# Patient Record
Sex: Female | Born: 1940
Health system: Southern US, Community
[De-identification: ages and names within clinical notes are randomized; demographics above are authoritative.]

## PROBLEM LIST (undated history)

## (undated) DIAGNOSIS — F329 Major depressive disorder, single episode, unspecified: Secondary | ICD-10-CM

## (undated) DIAGNOSIS — E7800 Pure hypercholesterolemia, unspecified: Secondary | ICD-10-CM

## (undated) DIAGNOSIS — I8390 Asymptomatic varicose veins of unspecified lower extremity: Secondary | ICD-10-CM

## (undated) DIAGNOSIS — F32A Depression, unspecified: Secondary | ICD-10-CM

## (undated) DIAGNOSIS — R739 Hyperglycemia, unspecified: Secondary | ICD-10-CM

## (undated) DIAGNOSIS — F419 Anxiety disorder, unspecified: Secondary | ICD-10-CM

## (undated) DIAGNOSIS — B019 Varicella without complication: Secondary | ICD-10-CM

## (undated) DIAGNOSIS — K219 Gastro-esophageal reflux disease without esophagitis: Secondary | ICD-10-CM

## (undated) DIAGNOSIS — R011 Cardiac murmur, unspecified: Secondary | ICD-10-CM

## (undated) DIAGNOSIS — M1990 Unspecified osteoarthritis, unspecified site: Secondary | ICD-10-CM

## (undated) DIAGNOSIS — J342 Deviated nasal septum: Secondary | ICD-10-CM

## (undated) DIAGNOSIS — M5136 Other intervertebral disc degeneration, lumbar region: Secondary | ICD-10-CM

## (undated) DIAGNOSIS — K649 Unspecified hemorrhoids: Secondary | ICD-10-CM

## (undated) DIAGNOSIS — R7303 Prediabetes: Secondary | ICD-10-CM

## (undated) DIAGNOSIS — I10 Essential (primary) hypertension: Secondary | ICD-10-CM

## (undated) DIAGNOSIS — M51369 Other intervertebral disc degeneration, lumbar region without mention of lumbar back pain or lower extremity pain: Secondary | ICD-10-CM

## (undated) DIAGNOSIS — M419 Scoliosis, unspecified: Secondary | ICD-10-CM

## (undated) DIAGNOSIS — R0781 Pleurodynia: Secondary | ICD-10-CM

## (undated) DIAGNOSIS — S20219A Contusion of unspecified front wall of thorax, initial encounter: Secondary | ICD-10-CM

## (undated) HISTORY — DX: Gastro-esophageal reflux disease without esophagitis: K21.9

## (undated) HISTORY — DX: Anxiety disorder, unspecified: F41.9

## (undated) HISTORY — DX: Depression, unspecified: F32.A

## (undated) HISTORY — DX: Essential (primary) hypertension: I10

## (undated) HISTORY — DX: Unspecified osteoarthritis, unspecified site: M19.90

## (undated) HISTORY — PX: INCISION AND DRAINAGE BREAST ABSCESS: SUR672

## (undated) HISTORY — DX: Cardiac murmur, unspecified: R01.1

## (undated) HISTORY — DX: Asymptomatic varicose veins of unspecified lower extremity: I83.90

## (undated) HISTORY — PX: OVARIAN CYST SURGERY: SHX726

## (undated) HISTORY — DX: Pure hypercholesterolemia, unspecified: E78.00

## (undated) HISTORY — DX: Varicella without complication: B01.9

## (undated) HISTORY — DX: Major depressive disorder, single episode, unspecified: F32.9

---

## 1968-08-25 HISTORY — PX: TUBAL LIGATION: SHX77

## 1970-08-25 HISTORY — PX: ABDOMINAL HYSTERECTOMY: SHX81

## 1971-08-26 HISTORY — PX: TONSILLECTOMY AND ADENOIDECTOMY: SUR1326

## 1973-08-25 HISTORY — PX: HEMORROIDECTOMY: SUR656

## 1989-08-25 HISTORY — PX: OVARIAN CYST SURGERY: SHX726

## 2005-08-25 HISTORY — PX: DENTAL SURGERY: SHX609

## 2013-02-21 ENCOUNTER — Ambulatory Visit (INDEPENDENT_AMBULATORY_CARE_PROVIDER_SITE_OTHER): Payer: Medicare Other | Admitting: Family Medicine

## 2013-02-21 ENCOUNTER — Encounter: Payer: Self-pay | Admitting: Family Medicine

## 2013-02-21 VITALS — BP 165/98 | Temp 97.9°F | Ht 60.25 in | Wt 180.0 lb

## 2013-02-21 DIAGNOSIS — R7309 Other abnormal glucose: Secondary | ICD-10-CM

## 2013-02-21 DIAGNOSIS — M549 Dorsalgia, unspecified: Secondary | ICD-10-CM

## 2013-02-21 DIAGNOSIS — K219 Gastro-esophageal reflux disease without esophagitis: Secondary | ICD-10-CM

## 2013-02-21 DIAGNOSIS — R7989 Other specified abnormal findings of blood chemistry: Secondary | ICD-10-CM

## 2013-02-21 DIAGNOSIS — Z23 Encounter for immunization: Secondary | ICD-10-CM

## 2013-02-21 DIAGNOSIS — R739 Hyperglycemia, unspecified: Secondary | ICD-10-CM

## 2013-02-21 DIAGNOSIS — Z1211 Encounter for screening for malignant neoplasm of colon: Secondary | ICD-10-CM

## 2013-02-21 DIAGNOSIS — M1990 Unspecified osteoarthritis, unspecified site: Secondary | ICD-10-CM

## 2013-02-21 DIAGNOSIS — E785 Hyperlipidemia, unspecified: Secondary | ICD-10-CM

## 2013-02-21 DIAGNOSIS — I10 Essential (primary) hypertension: Secondary | ICD-10-CM

## 2013-02-21 LAB — CBC WITH DIFFERENTIAL/PLATELET
Basophils Relative: 0.4 % (ref 0.0–3.0)
Eosinophils Relative: 1.2 % (ref 0.0–5.0)
Lymphocytes Relative: 28.2 % (ref 12.0–46.0)
Monocytes Absolute: 0.3 10*3/uL (ref 0.1–1.0)
Neutrophils Relative %: 65 % (ref 43.0–77.0)
Platelets: 198 10*3/uL (ref 150.0–400.0)
RBC: 4.48 Mil/uL (ref 3.87–5.11)
WBC: 5.7 10*3/uL (ref 4.5–10.5)

## 2013-02-21 LAB — HEMOGLOBIN A1C: Hgb A1c MFr Bld: 5.9 % (ref 4.6–6.5)

## 2013-02-21 LAB — LIPID PANEL
Cholesterol: 227 mg/dL — ABNORMAL HIGH (ref 0–200)
HDL: 49.1 mg/dL (ref >39.00)
Total CHOL/HDL Ratio: 5
Triglycerides: 207 mg/dL — ABNORMAL HIGH (ref 0.0–149.0)
VLDL: 41.4 mg/dL — ABNORMAL HIGH (ref 0.0–40.0)

## 2013-02-21 LAB — BASIC METABOLIC PANEL
BUN: 19 mg/dL (ref 6–23)
Calcium: 9.9 mg/dL (ref 8.4–10.5)
Creatinine, Ser: 0.8 mg/dL (ref 0.4–1.2)
GFR: 75.84 mL/min (ref >60.00)

## 2013-02-21 MED ORDER — OMEPRAZOLE 40 MG PO CPDR: 40.0000 mg | DELAYED_RELEASE_CAPSULE | Freq: Every day | ORAL | Status: DC

## 2013-02-21 NOTE — Patient Instructions (Signed)
-We have ordered labs or studies at this visit. It can take up to 1-2 weeks for results and processing. We will contact you with instructions IF your results are abnormal. Normal results will be released to your [facility] If you have not heard from [place] or can not find your results in [facility] in 2 weeks please contact our office.  -PLEASE SIGN UP FOR MYCHART TODAY   We recommend the following healthy lifestyle measures: - eat a healthy diet consisting of lots of vegetables, fruits, beans, nuts, seeds, healthy meats such as white chicken and fish and whole grains.  - avoid fried foods, fast food, processed foods, sodas, red meet and other fattening foods.  - get a least 150 minutes of aerobic exercise per week.   For your acid reflux: -start the Prilosec daily and follow diet recommendations below  For Your Back: -home exercise program provided -heat for 15 minutes twice daily -topical sports creams with capsacin or menthol if you find these help -We placed a referral for you as discussed to the back doctor. It usually takes about 1-2 weeks to process and schedule this referral. If you have not heard from [place] regarding this appointment in 2 weeks please contact our office.  Schedule your mammogram  -We placed a referral for you as discussed to the gastroenterologist for your colonoscopy. It usually takes about 1-2 weeks to process and schedule this referral. If you have not heard from [place] regarding this appointment in 2 weeks please contact our office.   Follow up in: 4 weeks to recheck BP and for your physical exam    Gastroesophageal Reflux Disease, Adult Gastroesophageal reflux disease (GERD) happens when acid from your stomach flows up into the esophagus. When acid comes in contact with the esophagus, the acid causes soreness (inflammation) in the esophagus. Over time, GERD may create small holes (ulcers) in the lining of the esophagus. CAUSES   Increased body weight. This puts pressure  on the stomach, making acid rise from the stomach into the esophagus.  Smoking. This increases acid production in the stomach.  Drinking alcohol. This causes decreased pressure in the lower esophageal sphincter (valve or ring of muscle between the esophagus and stomach), allowing acid from the stomach into the esophagus.  Late evening meals and a full stomach. This increases pressure and acid production in the stomach.  A malformed lower esophageal sphincter. Sometimes, no cause is found. SYMPTOMS   Burning pain in the lower part of the mid-chest behind the breastbone and in the mid-stomach area. This may occur twice a week or more often.  Trouble swallowing.  Sore throat.  Dry cough.  Asthma-like symptoms including chest tightness, shortness of breath, or wheezing. DIAGNOSIS  Your caregiver may be able to diagnose GERD based on your symptoms. In some cases, X-rays and other tests may be done to check for complications or to check the condition of your stomach and esophagus. TREATMENT  Your caregiver may recommend over-the-counter or prescription medicines to help decrease acid production. Ask your caregiver before starting or adding any new medicines.  HOME CARE INSTRUCTIONS   Change the factors that you can control. Ask your caregiver for guidance concerning weight loss, quitting smoking, and alcohol consumption.  Avoid foods and drinks that make your symptoms worse, such as:  Caffeine or alcoholic drinks.  Chocolate.  Peppermint or mint flavorings.  Garlic and onions.  Spicy foods.  Citrus fruits, such as oranges, lemons, or limes.  Tomato-based foods such as sauce,  chili, salsa, and pizza.  Fried and fatty foods.  Avoid lying down for the 3 hours prior to your bedtime or prior to taking a nap.  Eat small, frequent meals instead of large meals.  Wear loose-fitting clothing. Do not wear anything tight around your waist that causes pressure on your  stomach.  Raise the head of your bed 6 to 8 inches with wood blocks to help you sleep. Extra pillows will not help.  Only take over-the-counter or prescription medicines for pain, discomfort, or fever as directed by your caregiver.  Do not take aspirin, ibuprofen, or other nonsteroidal anti-inflammatory drugs (NSAIDs). SEEK IMMEDIATE MEDICAL CARE IF:   You have pain in your arms, neck, jaw, teeth, or back.  Your pain increases or changes in intensity or duration.  You develop nausea, vomiting, or sweating (diaphoresis).  You develop shortness of breath, or you faint.  Your vomit is green, yellow, black, or looks like coffee grounds or blood.  Your stool is red, bloody, or black. These symptoms could be signs of other problems, such as heart disease, gastric bleeding, or esophageal bleeding. MAKE SURE YOU:   Understand these instructions.  Will watch your condition.  Will get help right away if you are not doing well or get worse. Document Released: 05/21/2005 Document Revised: 11/03/2011 Document Reviewed: 02/28/2011 [facility] Patient Information 2014 [place], [place].

## 2013-02-21 NOTE — Addendum Note (Signed)
Addended by: Earle Gell C on: 02/21/2013 01:00 PM   Modules accepted: Orders

## 2013-02-21 NOTE — Progress Notes (Signed)
Chief Complaint  Patient presents with  . Establish Care    HPI:  Tammy [name] is here to establish care. Moved to [place] recently. Reports does not go to a doctor often.  Last PCP and physical:  Has the following chronic problems and concerns today:  Chronic Back Pain: -started many years ago - dx of DDD in 1971, has had worsening of back issues over last 3 years -pain is mid-lowback R>L, constant, pain is 0-10/10 -better with laying flat, standing or sitting for long periods makes it worse -occ intermittent numbness on R lateral upper leg -denies: fevers, malaise, weakness in legs, bowel of bladder dysfunction, weight loss -she wants to see a specialist but prefers non-surgical options  GERD: -chronically - but worse last 6 months -takes tums a few times per day -symptoms worse after meals, feels acid coming up in throat -denies: weight loss, CP, trouble swallowing  -did try an OTC medicine but doesn't remember what or if it helped  HTN/HLD: -reports never went to doctor regularly but has had elevated lipids and HTN in the past -reports up in the past, BP runs high sometimes, but reports never on medication -denies: CP, SOB, swelling, palpitation  There are no active problems to display for this patient.  Health Maintenance: -refuses pneumonia vaccine -wants shingles vaccine -last mammo in the 70s -never had colon cancer screening and wants to do colonoscopy -hx of hysterectomy  ROS: See pertinent positives and negatives per HPI.  Past Medical History  Diagnosis Date  . Arthritis   . Chicken pox   . Hypertension     high blood pressure readings  . Varicose vein   . Depression     mild, never on medication  . GERD (gastroesophageal reflux disease)   . Heart murmur     told in 2012 benign  . High cholesterol     Family History  Problem Relation Age of Onset  . Alcohol abuse Father   . Heart disease Father   . Arthritis Mother   . Stroke Mother    . Hypertension Mother   . Diabetes Mother   . Colon cancer Other   . Breast cancer Maternal Grandmother   . Breast cancer Maternal Aunt     History   Social History  . Marital Status: Single    Spouse Name: N/A    Number of Children: N/A  . Years of Education: N/A   Social History Main Topics  . Smoking status: Never Smoker   . Smokeless tobacco: None  . Alcohol Use: Yes     Comment: daily; 2 beers and sometimes a mixed drink   . Drug Use: No  . Sexually Active: None   Other Topics Concern  . 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      Spiritual Beliefs: spiritual - but not religious      Lifestyle: rolls side to side, true back, mule kicks, walks 3-4 days per week for 1 mile; diet is ok - not great             Current outpatient prescriptions:aspirin 325 MG tablet, Take 325 mg by mouth daily., Disp: , Rfl: ;  Cholecalciferol (VITAMIN D) 1000 UNITS capsule, Take 1,000 Units by mouth daily., Disp: , Rfl: ;  fish oil-omega-3 fatty acids 1000 MG capsule, Take 2 g by mouth 2 (two) times daily., Disp: , Rfl: ;  ibuprofen (ADVIL,MOTRIN)  200 MG tablet, Take 200 mg by mouth every 6 (six) hours as needed for pain., Disp: , Rfl:  Multiple Vitamin (MULTIVITAMIN) tablet, Take 1 tablet by mouth. 1/2 in am and pm, Disp: , Rfl: ;  Melatonin 10 MG CAPS, Take by mouth at bedtime., Disp: , Rfl: ;  omeprazole (PRILOSEC) 40 MG capsule, Take 1 capsule (40 mg total) by mouth daily., Disp: 30 capsule, Rfl: 3  EXAM:  Filed Vitals:   02/21/13 1103  BP: 165/98  Temp: 97.9 F (36.6 C)    Body mass index is 34.88 kg/(m^2).  GENERAL: vitals reviewed and listed above, alert, oriented, appears well hydrated and in no acute distress  HEENT: atraumatic, conjunttiva clear, no obvious abnormalities on inspection of external nose and ears  NECK: no obvious masses on inspection  LUNGS: clear to auscultation bilaterally, no wheezes, rales or  rhonchi, good air movement  CV: HRRR, no peripheral edema  MS: moves all extremities without noticeable abnormality Normal Gait, scoliosis - concave L thoracic and lumbar No bony TTP Soft tissue TTP at: R paraspinal muscle lower thoracic and upper lumbar R >L -/+ tests: neg trendelenburg,+facet loading, -SLRT, -CLRT, -FABER, -FADIR Normal muscle strength, sensation to light touch and DTRs in LEs bilaterally  PSYCH: pleasant and cooperative, no obvious depression or anxiety  ASSESSMENT AND PLAN:  Discussed the following assessment and plan:  Hyperlipemia - Plan: Lipid Panel  Essential hypertension, benign - Plan: Basic metabolic panel -advised tx - she refused and wants to recheck next visit, discussed risks of untreated HTN versus meds  Abnormal CBC - Plan: CBC with Differential -reports hx abnormal CBC at one point and wants to recheck today  Hyperglycemia - Plan: Hemoglobin A1c  Back pain - Plan: Amb referral to Pediatric Physical Medicine Rehab -per exam suspect sig DDD, facet arthropathy, radiculopathy -she wants to see specialist, discussed options and with have her see PMR -HEP, supportive care in the meantime  Osteoarthritis - Plan: Amb referral to Pediatric Physical Medicine Rehab  GERD (gastroesophageal reflux disease) - Plan: omeprazole (PRILOSEC) 40 MG capsule -trial of PPI - disucssed risks  Colon cancer screening - Plan: Ambulatory referral to Gastroenterology   -We reviewed the PMH, PSH, FH, SH, Meds and Allergies. -We provided refills for any medications we will prescribe as needed. -We addressed current concerns per orders and patient instructions. -We have asked for records for pertinent exams, studies, vaccines and notes from previous providers. -We have advised patient to follow up per instructions below. -shingles vaccine today per pt request Health Maintenance: refused pneumovax, she is to schedule mammo, referred for colonoscopy, follow up for  CPE ->45 minutes spent with this patient  -Patient advised to return or notify a doctor immediately if symptoms worsen or persist or new concerns arise.  Patient Instructions  -We have ordered labs or studies at this visit. It can take up to 1-2 weeks for results and processing. We will contact you with instructions IF your results are abnormal. Normal results will be released to your [facility]. If you have not heard from [place] or can not find your results in [facility] in 2 weeks please contact our office.  -PLEASE SIGN UP FOR MYCHART TODAY   We recommend the following healthy lifestyle measures: - eat a healthy diet consisting of lots of vegetables, fruits, beans, nuts, seeds, healthy meats such as white chicken and fish and whole grains.  - avoid fried foods, fast food, processed foods, sodas, red meet and other fattening foods.  -  get a least 150 minutes of aerobic exercise per week.   For your acid reflux: -start the Prilosec daily and follow diet recommendations below  For Your Back: -home exercise program provided -heat for 15 minutes twice daily -topical sports creams with capsacin or menthol if you find these help -We placed a referral for you as discussed to the back doctor. It usually takes about 1-2 weeks to process and schedule this referral. If you have not heard from [place] regarding this appointment in 2 weeks please contact our office.  Schedule your mammogram  -We placed a referral for you as discussed to the gastroenterologist for your colonoscopy. It usually takes about 1-2 weeks to process and schedule this referral. If you have not heard from [place] regarding this appointment in 2 weeks please contact our office.   Follow up in: 4 weeks to recheck BP and for your physical exam    Gastroesophageal Reflux Disease, Adult Gastroesophageal reflux disease (GERD) happens when acid from your stomach flows up into the esophagus. When acid comes in contact with the esophagus, the acid  causes soreness (inflammation) in the esophagus. Over time, GERD may create small holes (ulcers) in the lining of the esophagus. CAUSES   Increased body weight. This puts pressure on the stomach, making acid rise from the stomach into the esophagus.  Smoking. This increases acid production in the stomach.  Drinking alcohol. This causes decreased pressure in the lower esophageal sphincter (valve or ring of muscle between the esophagus and stomach), allowing acid from the stomach into the esophagus.  Late evening meals and a full stomach. This increases pressure and acid production in the stomach.  A malformed lower esophageal sphincter. Sometimes, no cause is found. SYMPTOMS   Burning pain in the lower part of the mid-chest behind the breastbone and in the mid-stomach area. This may occur twice a week or more often.  Trouble swallowing.  Sore throat.  Dry cough.  Asthma-like symptoms including chest tightness, shortness of breath, or wheezing. DIAGNOSIS  Your caregiver may be able to diagnose GERD based on your symptoms. In some cases, X-rays and other tests may be done to check for complications or to check the condition of your stomach and esophagus. TREATMENT  Your caregiver may recommend over-the-counter or prescription medicines to help decrease acid production. Ask your caregiver before starting or adding any new medicines.  HOME CARE INSTRUCTIONS   Change the factors that you can control. Ask your caregiver for guidance concerning weight loss, quitting smoking, and alcohol consumption.  Avoid foods and drinks that make your symptoms worse, such as:  Caffeine or alcoholic drinks.  Chocolate.  Peppermint or mint flavorings.  Garlic and onions.  Spicy foods.  Citrus fruits, such as oranges, lemons, or limes.  Tomato-based foods such as sauce, chili, salsa, and pizza.  Fried and fatty foods.  Avoid lying down for the 3 hours prior to your bedtime or prior to taking  a nap.  Eat small, frequent meals instead of large meals.  Wear loose-fitting clothing. Do not wear anything tight around your waist that causes pressure on your stomach.  Raise the head of your bed 6 to 8 inches with wood blocks to help you sleep. Extra pillows will not help.  Only take over-the-counter or prescription medicines for pain, discomfort, or fever as directed by your caregiver.  Do not take aspirin, ibuprofen, or other nonsteroidal anti-inflammatory drugs (NSAIDs). SEEK IMMEDIATE MEDICAL CARE IF:   You have pain in your arms,  neck, jaw, teeth, or back.  Your pain increases or changes in intensity or duration.  You develop nausea, vomiting, or sweating (diaphoresis).  You develop shortness of breath, or you faint.  Your vomit is green, yellow, black, or looks like coffee grounds or blood.  Your stool is red, bloody, or black. These symptoms could be signs of other problems, such as heart disease, gastric bleeding, or esophageal bleeding. MAKE SURE YOU:   Understand these instructions.  Will watch your condition.  Will get help right away if you are not doing well or get worse. Document Released: 05/21/2005 Document Revised: 11/03/2011 Document Reviewed: 02/28/2011 [facility] Patient Information 2014 [place], Lona K[place], Dahlia Client R.

## 2013-02-23 ENCOUNTER — Encounter: Payer: Self-pay | Admitting: Internal Medicine

## 2013-02-23 ENCOUNTER — Other Ambulatory Visit: Payer: Self-pay

## 2013-02-23 DIAGNOSIS — Z1231 Encounter for screening mammogram for malignant neoplasm of breast: Secondary | ICD-10-CM

## 2013-02-24 NOTE — Progress Notes (Signed)
Quick Note:  Left a message for return call. ______ 

## 2013-02-24 NOTE — Progress Notes (Signed)
Quick Note:  Labs mailed to patient. ______

## 2013-03-07 ENCOUNTER — Encounter: Payer: Self-pay | Admitting: Physical Medicine & Rehabilitation

## 2013-03-18 ENCOUNTER — Ambulatory Visit
Admission: RE | Admit: 2013-03-18 | Discharge: 2013-03-18 | Disposition: A | Discharge status: Home / Self Care | Payer: Medicare Other | Source: Ambulatory Visit

## 2013-03-18 DIAGNOSIS — Z1231 Encounter for screening mammogram for malignant neoplasm of breast: Secondary | ICD-10-CM

## 2013-03-21 ENCOUNTER — Other Ambulatory Visit: Payer: Self-pay

## 2013-03-21 DIAGNOSIS — K219 Gastro-esophageal reflux disease without esophagitis: Secondary | ICD-10-CM

## 2013-03-21 MED ORDER — OMEPRAZOLE 40 MG PO CPDR: 40.0000 mg | DELAYED_RELEASE_CAPSULE | Freq: Every day | ORAL | Status: DC

## 2013-03-22 ENCOUNTER — Encounter: Payer: Self-pay | Admitting: Family Medicine

## 2013-03-22 ENCOUNTER — Ambulatory Visit (INDEPENDENT_AMBULATORY_CARE_PROVIDER_SITE_OTHER): Payer: Medicare Other | Admitting: Family Medicine

## 2013-03-22 VITALS — BP 130/88 | Temp 98.1°F | Wt 177.0 lb

## 2013-03-22 DIAGNOSIS — Z8262 Family history of osteoporosis: Secondary | ICD-10-CM

## 2013-03-22 DIAGNOSIS — R229 Localized swelling, mass and lump, unspecified: Secondary | ICD-10-CM

## 2013-03-22 DIAGNOSIS — R2230 Localized swelling, mass and lump, unspecified upper limb: Secondary | ICD-10-CM

## 2013-03-22 DIAGNOSIS — E785 Hyperlipidemia, unspecified: Secondary | ICD-10-CM

## 2013-03-22 DIAGNOSIS — I10 Essential (primary) hypertension: Secondary | ICD-10-CM | POA: Insufficient documentation

## 2013-03-22 DIAGNOSIS — M259 Joint disorder, unspecified: Secondary | ICD-10-CM

## 2013-03-22 DIAGNOSIS — R7303 Prediabetes: Secondary | ICD-10-CM

## 2013-03-22 DIAGNOSIS — R7309 Other abnormal glucose: Secondary | ICD-10-CM

## 2013-03-22 DIAGNOSIS — Z13820 Encounter for screening for osteoporosis: Secondary | ICD-10-CM

## 2013-03-22 DIAGNOSIS — M549 Dorsalgia, unspecified: Secondary | ICD-10-CM

## 2013-03-22 DIAGNOSIS — Z23 Encounter for immunization: Secondary | ICD-10-CM

## 2013-03-22 DIAGNOSIS — Z8269 Family history of other diseases of the musculoskeletal system and connective tissue: Secondary | ICD-10-CM

## 2013-03-22 DIAGNOSIS — E669 Obesity, unspecified: Secondary | ICD-10-CM | POA: Insufficient documentation

## 2013-03-22 DIAGNOSIS — R2231 Localized swelling, mass and lump, right upper limb: Secondary | ICD-10-CM

## 2013-03-22 DIAGNOSIS — G8929 Other chronic pain: Secondary | ICD-10-CM | POA: Insufficient documentation

## 2013-03-22 DIAGNOSIS — Z0000 Encounter for general adult medical examination without abnormal findings: Secondary | ICD-10-CM

## 2013-03-22 DIAGNOSIS — K219 Gastro-esophageal reflux disease without esophagitis: Secondary | ICD-10-CM | POA: Insufficient documentation

## 2013-03-22 NOTE — Progress Notes (Signed)
Medicare Annual Preventive Care Visit  (annual wellness exam)  Concerns/Follow up issues today:  A) HTN: -refused tx last visit -takes ASA and fish oil; she has been working on diet somewhat, does exercise daily -denies:CP, SOB, swelling  B)chronic back pain -referred to PMR per her request last visit -seeing Dr. 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  NO  History of falling?  NO  Any trouble with IADLs - using a phone, using transportation, grocery shopping, preparing meals, doing housework, doing laundry, taking medications and managing money?  NO  Advance Directives? NO   See summary of recommendations in Patient Instructions below.  4.) Physical Exam Filed Vitals:   03/22/13 1134  BP: 130/88  Temp: 98.1 F (36.7 C)   Estimated body mass index is 34.3 kg/(m^2) as calculated from the following:   Height as of 02/21/13: 5' 0.25" (1.53 m).   Weight as of this encounter: 177 lb (80.287 kg).  NEURO/PSYCH: Mini Cog: 1. Patient instructed to listen carefully and repeat the following: Recalled 2 words  2. Clock drawing test was administered: NORMAL       3. Recall of three words 2/3, normal clock draw  Patient Score: NEG   CV:  HRRR  LUNG: CTA  SKIN/MS: Has large soft mobile subcutaneous mass superior to L medial clavicle and aprox 5 cm sub cutaneous mobile soft mass on R upper arm  See patient instructions for recommendations.  4)The following written screening schedule of preventive measures were reviewed with assessment and plan made per below, orders and patient instructions:       Alcohol screening: negative - discussed safe drinking leves      Obesity Screening and counseling: lifestyle recs for diet and exercise discussed      STI screening: neg/deferred      Tobacco Screening: neg       Pneumococcal (PPSV23 -one dose after 64, one before if risk factors), influenza yearly and hepatitis B vaccines (if high risk - end stage renal disease, IV drugs, homosexual men, live in home for mentally retarded, hemophilia receiving factors) ASSESSMENT/PLAN: will have today at this visit      Screening mammograph (yearly if >40) ASSESSMENT/PLAN: done/03/18/13 and normal      Screening Pap smear/pelvic exam (q2 years) ASSESSMENT/PLAN: N/A, s/p hysterectomy      Colorectal cancer screening (FOBT yearly or flex sig q4y or colonoscopy q10y or barium enema q4y) ASSESSMENT/PLAN: referred for colonoscopy; scheduled in October 2014      Bone mass measurements(covered q2y if indicated - estrogen def, osteoporosis, hyperparathyroid, vertebral abnormalities, osteoporosis or steroids) ASSESSMENT/PLAN: referral sent for bone density      Cardiovascular screening blood tests (lipids q5y) ASSESSMENT/PLAN: done last visit      Diabetes screening  tests ASSESSMENT/PLAN: Done last visit  Encounter for Medicare annual wellness exam - Plan: Pneumococcal polysaccharide vaccine 23-valent greater than or equal to 2yo subcutaneous/IM  Essential hypertension, benign  Chronic back pain  GERD (gastroesophageal reflux disease)  Prediabetes  Hyperlipidemia, mild  Screening for osteoporosis - Plan: DG Bone Density  Shoulder  mass - Plan: Ambulatory referral to General Surgery  Arm mass, right - Plan: Ambulatory referral to General Surgery    7.) Summary: -risk factors and conditions per above assessment were discussed and treatment, recommendations and referrals were offered per documentation above and orders and patient instructions.

## 2013-03-22 NOTE — Patient Instructions (Addendum)
Please see a lawyer and/or go to this website to help you with advanced directives and designating a health care power of attorney so that your wishes will be followed should you become too ill to make your own medical decisions.  [email or web address]  We recommend the following healthy lifestyle measures: - eat a healthy diet consisting of lots of vegetables, fruits, beans, nuts, seeds, healthy meats such as white chicken and fish and whole grains.  - avoid fried foods, fast food, processed foods, sodas, red meet and other fattening foods.  - get a least 150 minutes of aerobic exercise per week.       Alcohol screening: negative - discussed safe drinking leves      Obesity Screening and counseling: lifestyle recs      STI screening: neg/deferred      Tobacco Screening: neg       Pneumococcal (PPSV23 -one dose after 64, one before if risk factors), influenza yearly and hepatitis B vaccines (if high risk - end stage renal disease, IV drugs, homosexual men, live in home for mentally retarded, hemophilia receiving factors) ASSESSMENT/PLAN: will have today at this visit      Screening mammograph (yearly if >40) ASSESSMENT/PLAN: done/03/18/13 and normal      Screening Pap smear/pelvic exam (q2 years) ASSESSMENT/PLAN: N/A, s/p hysterectomy      Colorectal cancer screening (FOBT yearly or flex sig q4y or colonoscopy q10y or barium enema q4y) ASSESSMENT/PLAN: referred for colonoscopy; scheduled in October 2014      Bone mass measurements(covered q2y if indicated - estrogen def, osteoporosis, hyperparathyroid, vertebral abnormalities, osteoporosis or steroids) ASSESSMENT/PLAN: referral sent for bone density      Cardiovascular screening blood tests (lipids q5y) ASSESSMENT/PLAN: done last visit      Diabetes screening tests ASSESSMENT/PLAN: Done last visit   -We placed a referral for you as discussed to the surgeon for your shoulder mass. It usually takes about 1-2  weeks to process and schedule this referral. If you have not heard from [place] regarding this appointment in 2 weeks please contact our office.

## 2013-03-29 ENCOUNTER — Telehealth: Payer: Self-pay | Admitting: Family Medicine

## 2013-03-29 NOTE — Telephone Encounter (Signed)
PT states that she would no longer like to have her bone density scan done at [facility], she would like to have it done at the breast center. Please revise orders.

## 2013-03-29 NOTE — Addendum Note (Signed)
Addended by: Earle Gell C on: 03/29/2013 11:00 AM   Modules accepted: Orders

## 2013-03-29 NOTE — Telephone Encounter (Signed)
Order changed.

## 2013-04-01 ENCOUNTER — Other Ambulatory Visit: Payer: Self-pay

## 2013-04-01 DIAGNOSIS — E2839 Other primary ovarian failure: Secondary | ICD-10-CM

## 2013-04-04 ENCOUNTER — Telehealth: Payer: Self-pay | Admitting: Family Medicine

## 2013-04-04 DIAGNOSIS — M1990 Unspecified osteoarthritis, unspecified site: Secondary | ICD-10-CM

## 2013-04-04 DIAGNOSIS — M549 Dorsalgia, unspecified: Secondary | ICD-10-CM

## 2013-04-04 NOTE — Telephone Encounter (Signed)
i spoke with [place] about the bone density and called and spoke with the breat center and changed the diagnosis code; Pls advise on ortho referral.

## 2013-04-04 NOTE — Telephone Encounter (Signed)
Referral placed.  Pt states she will wait until she sees Dr. Penni Bombard and then she will decide where she wants to have it.

## 2013-04-04 NOTE — Telephone Encounter (Signed)
Patient had referral to [facility] PM&R from Dr Selena Batten and had appt sched there. That office pushed the appt out and rescheduled her. Pt is cancelling that appt. Has made an appt at [facility] (Dr. Penni Bombard) for 8/25. She is requesting referral to Ortho for that appt instead. States all her osteo pain is getting worse.  She will cancel PM&R appt. Also, she does not want [place] to set her up for a bone density. Order is in EPIC, but no appt yet. She states she "does not yet have dx to support the test". However, she said she will have it done at [facility]. Thank you.

## 2013-04-04 NOTE — Telephone Encounter (Signed)
Ok on ortho referral.

## 2013-04-05 ENCOUNTER — Ambulatory Visit (INDEPENDENT_AMBULATORY_CARE_PROVIDER_SITE_OTHER): Payer: Medicare Other | Admitting: Surgery

## 2013-04-05 ENCOUNTER — Encounter (INDEPENDENT_AMBULATORY_CARE_PROVIDER_SITE_OTHER): Payer: Self-pay | Admitting: Surgery

## 2013-04-05 VITALS — BP 162/90 | HR 68 | Temp 97.2°F | Resp 12 | Ht 64.0 in | Wt 174.6 lb

## 2013-04-05 DIAGNOSIS — M799 Soft tissue disorder, unspecified: Secondary | ICD-10-CM

## 2013-04-05 DIAGNOSIS — M7989 Other specified soft tissue disorders: Secondary | ICD-10-CM | POA: Insufficient documentation

## 2013-04-05 NOTE — Progress Notes (Signed)
Patient ID: Tammy [name], female   DOB: [birth year], 72 y.o.   MRN: [id number]  Chief Complaint  Patient presents with  . Mass    HPI Tammy [name] is a 72 y.o. female.   HPI This is a very pleasant female referred by Dr. Verda Cumins for evaluation of multiple painful soft tissue masses. The patient reports she has had these for many years but are becoming quite larger and causing her discomfort. She has discomfort in her left shoulder as well as along her right back. She also has a 1 cm abnormal-appearing skin lesion on her left inner thigh which is of concern to her. She is otherwise without complaints. She reports the size is becoming larger over all these areas and causing increasing discomfort  Past Medical History  Diagnosis Date  . Arthritis   . Chicken pox   . Hypertension     high blood pressure readings  . Varicose vein   . Depression     mild, never on medication  . GERD (gastroesophageal reflux disease)   . Heart murmur     told in 2012 benign  . High cholesterol     Past Surgical History  Procedure Laterality Date  . Tonsillectomy and adenoidectomy  1973  . Abdominal hysterectomy  1972  . Incision and drainage breast abscess  1967  . Ovarian cyst surgery  1991  . Back surgery  1971  . Tubal ligation      Family History  Problem Relation Age of Onset  . Alcohol abuse Father   . Heart disease Father   . Arthritis Mother   . Stroke Mother   . Hypertension Mother   . Diabetes Mother   . Colon cancer Other   . Breast cancer Maternal Grandmother   . Breast cancer Maternal Aunt     Social History History  Substance Use Topics  . Smoking status: Never Smoker   . Smokeless tobacco: Not on file  . Alcohol Use: 1.2 oz/week    2 Cans of beer per week     Comment: daily; 2 beers and sometimes a mixed drink     Allergies  Allergen Reactions  . Hydrocodone     Made her feel very strange  . Penicillins     Current Outpatient Prescriptions   Medication Sig Dispense Refill  . aspirin 325 MG tablet Take 325 mg by mouth daily.      . Cholecalciferol (VITAMIN D) 1000 UNITS capsule Take 1,000 Units by mouth daily.      . fish oil-omega-3 fatty acids 1000 MG capsule Take 2 g by mouth 2 (two) times daily.      Marland Kitchen ibuprofen (ADVIL,MOTRIN) 200 MG tablet Take 200 mg by mouth every 6 (six) hours as needed for pain.      . Melatonin 10 MG CAPS Take by mouth at bedtime.      . Multiple Vitamin (MULTIVITAMIN) tablet Take 1 tablet by mouth. 1/2 in am and pm      . omeprazole (PRILOSEC) 40 MG capsule Take 1 capsule (40 mg total) by mouth daily.  90 capsule  1  . ZOSTAVAX 56213 UNT/0.65ML injection        No current facility-administered medications for this visit.    Review of Systems Review of Systems  Constitutional: Negative for fever, chills and unexpected weight change.  HENT: Negative for hearing loss, congestion, sore throat, trouble swallowing and voice change.   Eyes: Negative for visual disturbance.  Respiratory:  Negative for cough and wheezing.   Cardiovascular: Negative for chest pain, palpitations and leg swelling.  Gastrointestinal: Negative for nausea, vomiting, abdominal pain, diarrhea, constipation, blood in stool, abdominal distention and anal bleeding.  Genitourinary: Negative for hematuria, vaginal bleeding and difficulty urinating.  Musculoskeletal: Negative for arthralgias.  Skin: Negative for rash and wound.  Neurological: Negative for seizures, syncope and headaches.  Hematological: Negative for adenopathy. Does not bruise/bleed easily.  Psychiatric/Behavioral: Negative for confusion.    Blood pressure 162/90, pulse 68, temperature 97.2 F (36.2 C), temperature source Temporal, resp. rate 12, height 5\' 4"  (1.626 m), weight 174 lb 9.6 oz (79.198 kg).  Physical Exam Physical Exam  Constitutional: She is oriented to person, place, and time. She appears well-developed and well-nourished. No distress.  HENT:   Head: Normocephalic and atraumatic.  Mouth/Throat: No oropharyngeal exudate.  Eyes: Conjunctivae are normal. Pupils are equal, round, and reactive to light. No scleral icterus.  Neck: Normal range of motion. Neck supple. No tracheal deviation present.  Cardiovascular: Normal rate, regular rhythm and normal heart sounds.   Pulmonary/Chest: Effort normal and breath sounds normal. No respiratory distress. She has no wheezes.  Musculoskeletal: Normal range of motion. She exhibits no tenderness.  Neurological: She is alert and oriented to person, place, and time.  Skin: Skin is warm and dry. She is not diaphoretic. No erythema.  There is a 5-6 cm large soft tissue mass on the left chest at the area of the clavicle. There is a 2 cm soft tissue mass on the posterior left shoulder. There is an 8-9 cm large soft tissue mass along the right flank/back. There is also a 1 cm abnormal-appearing skin lesion with abnormal borders on the left inner thigh just above the knee.  All of the soft tissue masses are mobile with no skin changes  Psychiatric: Her behavior is normal. Judgment normal.    Data Reviewed   Assessment    Multiple abnormal soft tissue masses and left thigh skin lesion     Plan    Because of the sizes of the soft tissue masses in her level of discomfort, removal of all these in the operating room as recommended. I also recommend removal of the abnormal skin lesion on the inner thigh to rule out malignancy. I discussed the risks of surgery with her which includes but is not limited to bleeding, infection, recurrence, injury to shrouding structures, seroma formation, need for further surgery, et Karie Soda. She understands and wishes to proceed. Surgery will be scheduled        Elizebeth Kluesner A 04/05/2013, 11:38 AM

## 2013-04-11 ENCOUNTER — Ambulatory Visit: Payer: Medicare Other | Admitting: Physical Medicine & Rehabilitation

## 2013-04-12 ENCOUNTER — Other Ambulatory Visit: Payer: Medicare Other

## 2013-04-12 ENCOUNTER — Encounter (HOSPITAL_COMMUNITY): Payer: Self-pay | Admitting: Pharmacy Technician

## 2013-04-20 ENCOUNTER — Encounter (HOSPITAL_COMMUNITY): Payer: Self-pay

## 2013-04-20 ENCOUNTER — Ambulatory Visit (HOSPITAL_COMMUNITY)
Admission: RE | Admit: 2013-04-20 | Discharge: 2013-04-20 | Disposition: A | Discharge status: Home / Self Care | Payer: Medicare Other | Source: Ambulatory Visit | Attending: Surgery | Admitting: Surgery

## 2013-04-20 ENCOUNTER — Encounter (HOSPITAL_COMMUNITY)
Admission: RE | Admit: 2013-04-20 | Discharge: 2013-04-20 | Disposition: A | Discharge status: Home / Self Care | Payer: Medicare Other | Source: Ambulatory Visit | Attending: Surgery | Admitting: Surgery

## 2013-04-20 DIAGNOSIS — Z01818 Encounter for other preprocedural examination: Secondary | ICD-10-CM | POA: Insufficient documentation

## 2013-04-20 DIAGNOSIS — Z01812 Encounter for preprocedural laboratory examination: Secondary | ICD-10-CM | POA: Insufficient documentation

## 2013-04-20 DIAGNOSIS — M799 Soft tissue disorder, unspecified: Secondary | ICD-10-CM | POA: Insufficient documentation

## 2013-04-20 DIAGNOSIS — Z01810 Encounter for preprocedural cardiovascular examination: Secondary | ICD-10-CM | POA: Insufficient documentation

## 2013-04-20 HISTORY — DX: Hyperglycemia, unspecified: R73.9

## 2013-04-20 LAB — CBC
MCH: 30 pg (ref 26.0–34.0)
MCV: 89.7 fL (ref 78.0–100.0)
Platelets: 218 10*3/uL (ref 150–400)
RDW: 13.1 % (ref 11.5–15.5)

## 2013-04-20 LAB — BASIC METABOLIC PANEL
Calcium: 10.3 mg/dL (ref 8.4–10.5)
Creatinine, Ser: 0.81 mg/dL (ref 0.50–1.10)
GFR calc Af Amer: 82 mL/min — ABNORMAL LOW (ref >90)
GFR calc non Af Amer: 70 mL/min — ABNORMAL LOW (ref >90)

## 2013-04-20 NOTE — Progress Notes (Signed)
Pt states that a biopsy of her right arm needs to be added to consent. Please address

## 2013-04-20 NOTE — H&P (Signed)
Patient ID: Tammy [name], female DOB: Jun 21, 1941, 72 y.o. MRN: [id number]  Chief Complaint   Patient presents with   .  Mass   HPI  Tammy [name] is a 72 y.o. female.  HPI  This is a very pleasant female referred by Dr. Verda Cumins for evaluation of multiple painful soft tissue masses. The patient reports she has had these for many years but are becoming quite larger and causing her discomfort. She has discomfort in her left shoulder as well as along her right back. She also has a 1 cm abnormal-appearing skin lesion on her left inner thigh which is of concern to her. She is otherwise without complaints. She reports the size is becoming larger over all these areas and causing increasing discomfort  Past Medical History   Diagnosis  Date   .  Arthritis    .  Chicken pox    .  Hypertension      high blood pressure readings   .  Varicose vein    .  Depression      mild, never on medication   .  GERD (gastroesophageal reflux disease)    .  Heart murmur      told in 2012 benign   .  High cholesterol     Past Surgical History   Procedure  Laterality  Date   .  Tonsillectomy and adenoidectomy   1973   .  Abdominal hysterectomy   1972   .  Incision and drainage breast abscess   1967   .  Ovarian cyst surgery   1991   .  Back surgery   1971   .  Tubal ligation      Family History   Problem  Relation  Age of Onset   .  Alcohol abuse  Father    .  Heart disease  Father    .  Arthritis  Mother    .  Stroke  Mother    .  Hypertension  Mother    .  Diabetes  Mother    .  Colon cancer  Other    .  Breast cancer  Maternal Grandmother    .  Breast cancer  Maternal Aunt    Social History  History   Substance Use Topics   .  Smoking status:  Never Smoker   .  Smokeless tobacco:  Not on file   .  Alcohol Use:  1.2 oz/week     2 Cans of beer per week      Comment: daily; 2 beers and sometimes a mixed drink    Allergies   Allergen  Reactions   .  Hydrocodone      Made her  feel very strange   .  Penicillins     Current Outpatient Prescriptions   Medication  Sig  Dispense  Refill   .  aspirin 325 MG tablet  Take 325 mg by mouth daily.     .  Cholecalciferol (VITAMIN D) 1000 UNITS capsule  Take 1,000 Units by mouth daily.     .  fish oil-omega-3 fatty acids 1000 MG capsule  Take 2 g by mouth 2 (two) times daily.     Marland Kitchen  ibuprofen (ADVIL,MOTRIN) 200 MG tablet  Take 200 mg by mouth every 6 (six) hours as needed for pain.     .  Melatonin 10 MG CAPS  Take by mouth at bedtime.     .  Multiple Vitamin (MULTIVITAMIN)  tablet  Take 1 tablet by mouth. 1/2 in am and pm     .  omeprazole (PRILOSEC) 40 MG capsule  Take 1 capsule (40 mg total) by mouth daily.  90 capsule  1   .  ZOSTAVAX 16109 UNT/0.65ML injection       No current facility-administered medications for this visit.   Review of Systems  Review of Systems  Constitutional: Negative for fever, chills and unexpected weight change.  HENT: Negative for hearing loss, congestion, sore throat, trouble swallowing and voice change.  Eyes: Negative for visual disturbance.  Respiratory: Negative for cough and wheezing.  Cardiovascular: Negative for chest pain, palpitations and leg swelling.  Gastrointestinal: Negative for nausea, vomiting, abdominal pain, diarrhea, constipation, blood in stool, abdominal distention and anal bleeding.  Genitourinary: Negative for hematuria, vaginal bleeding and difficulty urinating.  Musculoskeletal: Negative for arthralgias.  Skin: Negative for rash and wound.  Neurological: Negative for seizures, syncope and headaches.  Hematological: Negative for adenopathy. Does not bruise/bleed easily.  Psychiatric/Behavioral: Negative for confusion.  Blood pressure 162/90, pulse 68, temperature 97.2 F (36.2 C), temperature source Temporal, resp. rate 12, height 5\' 4"  (1.626 m), weight 174 lb 9.6 oz (79.198 kg).  Physical Exam  Physical Exam  Constitutional: She is oriented to person, place, and  time. She appears well-developed and well-nourished. No distress.  HENT:  Head: Normocephalic and atraumatic.  Mouth/Throat: No oropharyngeal exudate.  Eyes: Conjunctivae are normal. Pupils are equal, round, and reactive to light. No scleral icterus.  Neck: Normal range of motion. Neck supple. No tracheal deviation present.  Cardiovascular: Normal rate, regular rhythm and normal heart sounds.  Pulmonary/Chest: Effort normal and breath sounds normal. No respiratory distress. She has no wheezes.  Musculoskeletal: Normal range of motion. She exhibits no tenderness.  Neurological: She is alert and oriented to person, place, and time.  Skin: Skin is warm and dry. She is not diaphoretic. No erythema.  There is a 5-6 cm large soft tissue mass on the left chest at the area of the clavicle. There is a 2 cm soft tissue mass on the posterior left shoulder. There is an 8-9 cm large soft tissue mass along the right flank/back. There is also a 1 cm abnormal-appearing skin lesion with abnormal borders on the left inner thigh just above the knee. All of the soft tissue masses are mobile with no skin changes  Psychiatric: Her behavior is normal. Judgment normal.  Data Reviewed  Assessment  Multiple abnormal soft tissue masses and left thigh skin lesion  Plan  Because of the sizes of the soft tissue masses in her level of discomfort, removal of all these in the operating room as recommended. I also recommend removal of the abnormal skin lesion on the inner thigh to rule out malignancy. I discussed the risks of surgery with her which includes but is not limited to bleeding, infection, recurrence, injury to shrouding structures, seroma formation, need for further surgery, et Karie Soda. She understands and wishes to proceed. Surgery will be scheduled

## 2013-04-20 NOTE — Patient Instructions (Addendum)
20 Tammy [name]  04/20/2013   Your procedure is scheduled on: 04/22/13  Report to [facility] at 5:30 AM.  Call this number if you have problems the morning of surgery [phone]: [phone]   Remember:   Do not eat food or drink liquids After Midnight.     Take these medicines the morning of surgery with A SIP OF WATER: omeprazole    Do not wear jewelry, make-up or nail polish.  Do not wear lotions, powders, or perfumes. You may wear deodorant.  Do not shave 48 hours prior to surgery. Men may shave face and neck.  Do not bring valuables to the hospital.  Contacts, dentures or bridgework may not be worn into surgery.     Patients discharged the day of surgery will not be allowed to drive home.  Name and phone number of your driver: Tammy [name] [phone]   Birdie Sons, RN  pre op nurse call if needed [phone]    FAILURE TO FOLLOW THESE INSTRUCTIONS MAY RESULT IN CANCELLATION OF YOUR SURGERY   Patient Signature: ___________________________________________

## 2013-04-22 ENCOUNTER — Encounter (HOSPITAL_COMMUNITY)
Admission: RE | Disposition: A | Discharge status: Home / Self Care | Payer: Self-pay | Source: Ambulatory Visit | Attending: Surgery

## 2013-04-22 ENCOUNTER — Encounter (HOSPITAL_COMMUNITY): Payer: Self-pay | Admitting: Anesthesiology

## 2013-04-22 ENCOUNTER — Encounter (HOSPITAL_COMMUNITY): Payer: Self-pay | Admitting: *Deleted

## 2013-04-22 ENCOUNTER — Ambulatory Visit (HOSPITAL_COMMUNITY)
Admission: RE | Admit: 2013-04-22 | Discharge: 2013-04-22 | Disposition: A | Discharge status: Home / Self Care | Payer: Medicare Other | Source: Ambulatory Visit | Attending: Surgery | Admitting: Surgery

## 2013-04-22 ENCOUNTER — Ambulatory Visit (HOSPITAL_COMMUNITY): Payer: Medicare Other | Admitting: Anesthesiology

## 2013-04-22 DIAGNOSIS — Z79899 Other long term (current) drug therapy: Secondary | ICD-10-CM | POA: Insufficient documentation

## 2013-04-22 DIAGNOSIS — D1739 Benign lipomatous neoplasm of skin and subcutaneous tissue of other sites: Secondary | ICD-10-CM | POA: Insufficient documentation

## 2013-04-22 DIAGNOSIS — M129 Arthropathy, unspecified: Secondary | ICD-10-CM | POA: Insufficient documentation

## 2013-04-22 DIAGNOSIS — F329 Major depressive disorder, single episode, unspecified: Secondary | ICD-10-CM | POA: Insufficient documentation

## 2013-04-22 DIAGNOSIS — K219 Gastro-esophageal reflux disease without esophagitis: Secondary | ICD-10-CM | POA: Insufficient documentation

## 2013-04-22 DIAGNOSIS — E7800 Pure hypercholesterolemia, unspecified: Secondary | ICD-10-CM | POA: Insufficient documentation

## 2013-04-22 DIAGNOSIS — F3289 Other specified depressive episodes: Secondary | ICD-10-CM | POA: Insufficient documentation

## 2013-04-22 DIAGNOSIS — Q828 Other specified congenital malformations of skin: Secondary | ICD-10-CM

## 2013-04-22 DIAGNOSIS — Z7982 Long term (current) use of aspirin: Secondary | ICD-10-CM | POA: Insufficient documentation

## 2013-04-22 DIAGNOSIS — L909 Atrophic disorder of skin, unspecified: Secondary | ICD-10-CM | POA: Insufficient documentation

## 2013-04-22 DIAGNOSIS — I8390 Asymptomatic varicose veins of unspecified lower extremity: Secondary | ICD-10-CM | POA: Insufficient documentation

## 2013-04-22 DIAGNOSIS — R011 Cardiac murmur, unspecified: Secondary | ICD-10-CM | POA: Insufficient documentation

## 2013-04-22 DIAGNOSIS — I10 Essential (primary) hypertension: Secondary | ICD-10-CM | POA: Insufficient documentation

## 2013-04-22 HISTORY — PX: MASS EXCISION: SHX2000

## 2013-04-22 SURGERY — EXCISION MASS
Anesthesia: General | Site: Shoulder | Wound class: Clean

## 2013-04-22 MED ORDER — KETAMINE HCL 10 MG/ML IJ SOLN
INTRAMUSCULAR | Status: DC | PRN
  Administered 2013-04-22: 10 mg via INTRAVENOUS

## 2013-04-22 MED ORDER — BUPIVACAINE HCL (PF) 0.5 % IJ SOLN
INTRAMUSCULAR | Status: AC
  Filled 2013-04-22: qty 30

## 2013-04-22 MED ORDER — PROPOFOL 10 MG/ML IV BOLUS
INTRAVENOUS | Status: DC | PRN
  Administered 2013-04-22: 30 mg via INTRAVENOUS
  Administered 2013-04-22: 150 mg via INTRAVENOUS

## 2013-04-22 MED ORDER — OXYCODONE-ACETAMINOPHEN 5-325 MG PO TABS: 1.0000 | ORAL_TABLET | ORAL | Status: DC | PRN

## 2013-04-22 MED ORDER — ACETAMINOPHEN 650 MG RE SUPP
650.0000 mg | RECTAL | Status: DC | PRN
  Filled 2013-04-22: qty 1

## 2013-04-22 MED ORDER — SODIUM CHLORIDE 0.9 % IJ SOLN: 3.0000 mL | Freq: Two times a day (BID) | INTRAMUSCULAR | Status: DC

## 2013-04-22 MED ORDER — SUCCINYLCHOLINE CHLORIDE 20 MG/ML IJ SOLN
INTRAMUSCULAR | Status: DC | PRN
  Administered 2013-04-22: 100 mg via INTRAVENOUS

## 2013-04-22 MED ORDER — SODIUM CHLORIDE 0.9 % IJ SOLN: 3.0000 mL | INTRAMUSCULAR | Status: DC | PRN

## 2013-04-22 MED ORDER — ONDANSETRON HCL 4 MG/2ML IJ SOLN
INTRAMUSCULAR | Status: DC | PRN
  Administered 2013-04-22 (×2): 2 mg via INTRAVENOUS

## 2013-04-22 MED ORDER — LACTATED RINGERS IV SOLN
INTRAVENOUS | Status: DC | PRN
  Administered 2013-04-22: 07:00:00 via INTRAVENOUS

## 2013-04-22 MED ORDER — MORPHINE SULFATE 10 MG/ML IJ SOLN: 4.0000 mg | INTRAMUSCULAR | Status: DC | PRN

## 2013-04-22 MED ORDER — PROMETHAZINE HCL 25 MG/ML IJ SOLN: 6.2500 mg | INTRAMUSCULAR | Status: DC | PRN

## 2013-04-22 MED ORDER — CIPROFLOXACIN IN D5W 400 MG/200ML IV SOLN
INTRAVENOUS | Status: AC
  Filled 2013-04-22: qty 200

## 2013-04-22 MED ORDER — EPHEDRINE SULFATE 50 MG/ML IJ SOLN
INTRAMUSCULAR | Status: DC | PRN
  Administered 2013-04-22 (×3): 10 mg via INTRAVENOUS

## 2013-04-22 MED ORDER — FENTANYL CITRATE 0.05 MG/ML IJ SOLN: 25.0000 ug | INTRAMUSCULAR | Status: DC | PRN

## 2013-04-22 MED ORDER — FENTANYL CITRATE 0.05 MG/ML IJ SOLN
INTRAMUSCULAR | Status: DC | PRN
  Administered 2013-04-22 (×2): 25 ug via INTRAVENOUS
  Administered 2013-04-22: 75 ug via INTRAVENOUS
  Administered 2013-04-22: 25 ug via INTRAVENOUS
  Administered 2013-04-22: 75 ug via INTRAVENOUS
  Administered 2013-04-22: 25 ug via INTRAVENOUS

## 2013-04-22 MED ORDER — BUPIVACAINE HCL (PF) 0.5 % IJ SOLN
INTRAMUSCULAR | Status: DC | PRN
  Administered 2013-04-22: 30 mL

## 2013-04-22 MED ORDER — ONDANSETRON HCL 4 MG/2ML IJ SOLN: 4.0000 mg | Freq: Four times a day (QID) | INTRAMUSCULAR | Status: DC | PRN

## 2013-04-22 MED ORDER — OXYCODONE HCL 5 MG PO TABS
5.0000 mg | ORAL_TABLET | ORAL | Status: DC | PRN
  Administered 2013-04-22: 5 mg via ORAL
  Filled 2013-04-22: qty 1

## 2013-04-22 MED ORDER — SODIUM CHLORIDE 0.9 % IV SOLN: 250.0000 mL | INTRAVENOUS | Status: DC | PRN

## 2013-04-22 MED ORDER — GLYCOPYRROLATE 0.2 MG/ML IJ SOLN
INTRAMUSCULAR | Status: DC | PRN
  Administered 2013-04-22: 0.1 mg via INTRAVENOUS

## 2013-04-22 MED ORDER — ACETAMINOPHEN 325 MG PO TABS: 650.0000 mg | ORAL_TABLET | ORAL | Status: DC | PRN

## 2013-04-22 MED ORDER — CIPROFLOXACIN IN D5W 400 MG/200ML IV SOLN
400.0000 mg | INTRAVENOUS | Status: AC
  Administered 2013-04-22: 400 mg via INTRAVENOUS

## 2013-04-22 MED ORDER — 0.9 % SODIUM CHLORIDE (POUR BTL) OPTIME
TOPICAL | Status: DC | PRN
  Administered 2013-04-22: 1000 mL

## 2013-04-22 MED ORDER — KETOROLAC TROMETHAMINE 30 MG/ML IJ SOLN: 15.0000 mg | Freq: Once | INTRAMUSCULAR | Status: DC | PRN

## 2013-04-22 SURGICAL SUPPLY — 47 items
BENZOIN TINCTURE PRP APPL 2/3 (GAUZE/BANDAGES/DRESSINGS) ×4 IMPLANT
BLADE HEX COATED 2.75 (ELECTRODE) ×2 IMPLANT
BLADE SURG 15 STRL LF DISP TIS (BLADE) ×1 IMPLANT
BLADE SURG 15 STRL SS (BLADE) ×1
BLADE SURG SZ10 CARB STEEL (BLADE) ×2 IMPLANT
CANISTER SUCTION 2500CC (MISCELLANEOUS) ×2 IMPLANT
CHLORAPREP W/TINT 26ML (MISCELLANEOUS) ×2 IMPLANT
CLOTH BEACON ORANGE TIMEOUT ST (SAFETY) ×2 IMPLANT
CONT SPEC 4OZ CLIKSEAL STRL BL (MISCELLANEOUS) ×2 IMPLANT
DECANTER SPIKE VIAL GLASS SM (MISCELLANEOUS) IMPLANT
DERMABOND ADVANCED (GAUZE/BANDAGES/DRESSINGS) ×1
DERMABOND ADVANCED .7 DNX12 (GAUZE/BANDAGES/DRESSINGS) ×1 IMPLANT
DRAIN PENROSE 18X1/2 LTX STRL (DRAIN) IMPLANT
DRAPE LAPAROTOMY TRNSV 102X78 (DRAPE) IMPLANT
DRAPE ORTHO SPLIT 77X108 STRL (DRAPES) ×2
DRAPE SURG ORHT 6 SPLT 77X108 (DRAPES) ×2 IMPLANT
DRAPE UTILITY XL STRL (DRAPES) ×8 IMPLANT
ELECT REM PT RETURN 9FT ADLT (ELECTROSURGICAL) ×2
ELECTRODE REM PT RTRN 9FT ADLT (ELECTROSURGICAL) ×1 IMPLANT
GAUZE SPONGE 4X4 16PLY XRAY LF (GAUZE/BANDAGES/DRESSINGS) IMPLANT
GLOVE BIOGEL PI IND STRL 6.5 (GLOVE) ×1 IMPLANT
GLOVE BIOGEL PI INDICATOR 6.5 (GLOVE) ×1
GLOVE SURG SIGNA 7.5 PF LTX (GLOVE) ×4 IMPLANT
GLOVE SURG SS PI 6.5 STRL IVOR (GLOVE) ×2 IMPLANT
GOWN STRL NON-REIN LRG LVL3 (GOWN DISPOSABLE) ×2 IMPLANT
GOWN STRL REIN XL XLG (GOWN DISPOSABLE) ×4 IMPLANT
KIT BASIN OR (CUSTOM PROCEDURE TRAY) ×2 IMPLANT
NEEDLE HYPO 22GX1.5 SAFETY (NEEDLE) IMPLANT
NEEDLE HYPO 25X1 1.5 SAFETY (NEEDLE) ×2 IMPLANT
NS IRRIG 1000ML POUR BTL (IV SOLUTION) ×2 IMPLANT
PACK BASIC VI WITH GOWN DISP (CUSTOM PROCEDURE TRAY) ×2 IMPLANT
PENCIL BUTTON HOLSTER BLD 10FT (ELECTRODE) ×2 IMPLANT
SPONGE GAUZE 4X4 12PLY (GAUZE/BANDAGES/DRESSINGS) ×2 IMPLANT
SPONGE LAP 4X18 X RAY DECT (DISPOSABLE) IMPLANT
STRIP CLOSURE SKIN 1/2X4 (GAUZE/BANDAGES/DRESSINGS) ×4 IMPLANT
SUT MNCRL AB 4-0 PS2 18 (SUTURE) ×4 IMPLANT
SUT VIC AB 2-0 CT1 27 (SUTURE)
SUT VIC AB 2-0 CT1 TAPERPNT 27 (SUTURE) IMPLANT
SUT VIC AB 3-0 54XBRD REEL (SUTURE) IMPLANT
SUT VIC AB 3-0 BRD 54 (SUTURE)
SUT VIC AB 3-0 SH 27 (SUTURE) ×2
SUT VIC AB 3-0 SH 27X BRD (SUTURE) ×1 IMPLANT
SUT VIC AB 3-0 SH 27XBRD (SUTURE) ×1 IMPLANT
SYR BULB IRRIGATION 50ML (SYRINGE) IMPLANT
SYR CONTROL 10ML LL (SYRINGE) ×2 IMPLANT
TOWEL OR 17X26 10 PK STRL BLUE (TOWEL DISPOSABLE) ×2 IMPLANT
YANKAUER SUCT BULB TIP 10FT TU (MISCELLANEOUS) ×2 IMPLANT

## 2013-04-22 NOTE — Transfer of Care (Signed)
Immediate Anesthesia Transfer of Care Note  Patient: Celesta Aver  Procedure(s) Performed: Procedure(s) with comments: EXCISION MULTPILE soft tissue masses and abnormal skin lesion left thigh /left chest/ left posterior shoulder/ right flank  (N/A) - multiple sites:  left thigh, left shoulder, right flank, right upper arm  Patient Location: PACU  Anesthesia Type:General  Level of Consciousness: awake, alert , oriented and patient cooperative  Airway & Oxygen Therapy: Patient Spontanous Breathing and Patient connected to face mask oxygen  Post-op Assessment: Report given to PACU RN, Post -op Vital signs reviewed and stable and Patient moving all extremities  Post vital signs: stable  Complications: No apparent anesthesia complications

## 2013-04-22 NOTE — Anesthesia Postprocedure Evaluation (Signed)
  Anesthesia Post-op Note  Patient: Tammy [name]  Procedure(s) Performed: Procedure(s) (LRB): EXCISION MULTPILE soft tissue masses and abnormal skin lesion left thigh /left chest/ left posterior shoulder/ right flank  (N/A)  Patient Location: PACU  Anesthesia Type: General  Level of Consciousness: awake and alert   Airway and Oxygen Therapy: Patient Spontanous Breathing  Post-op Pain: mild  Post-op Assessment: Post-op Vital signs reviewed, Patient's Cardiovascular Status Stable, Respiratory Function Stable, Patent Airway and No signs of Nausea or vomiting  Last Vitals:  Filed Vitals:   04/22/13 0915  BP: 143/78  Pulse: 70  Temp:   Resp: 10    Post-op Vital Signs: stable   Complications: No apparent anesthesia complications

## 2013-04-22 NOTE — Op Note (Signed)
EXCISION MULTPILE soft tissue masses and abnormal skin lesion left thigh /left chest/ left posterior shoulder/ right flank   Procedure Note  KESA BIRKY 04/22/2013   Pre-op Diagnosis: multiple soft tissue masses, abnormal skin lesion left leg     Post-op Diagnosis: same  Procedure(s): EXCISION MULTPILE soft tissue masses and abnormal skin lesion left thigh /left chest/ left posterior shoulder/ right flank   Surgeon(s): Shelly Rubenstein, MD  Anesthesia: Choice  Staff:  Circulator: Wilhelmenia Blase, RN Scrub Person: Clarnce Flock, CST  Estimated Blood Loss: Minimal               Specimens: sent to path          [facility]   Date: 04/22/2013  Time: 8:32 AM

## 2013-04-22 NOTE — Anesthesia Preprocedure Evaluation (Signed)
Anesthesia Evaluation  Patient identified by MRN, date of birth, ID band Patient awake    Reviewed: Allergy & Precautions, H&P , NPO status , Patient's Chart, lab work & pertinent test results  Airway Mallampati: II TM Distance: >3 FB Neck ROM: Full    Dental no notable dental hx.    Pulmonary neg pulmonary ROS,  breath sounds clear to auscultation  Pulmonary exam normal       Cardiovascular hypertension, Rhythm:Regular Rate:Normal     Neuro/Psych negative neurological ROS  negative psych ROS   GI/Hepatic Neg liver ROS, GERD-  Medicated,  Endo/Other  negative endocrine ROS  Renal/GU negative Renal ROS  negative genitourinary   Musculoskeletal negative musculoskeletal ROS (+)   Abdominal   Peds negative pediatric ROS (+)  Hematology negative hematology ROS (+)   Anesthesia Other Findings   Reproductive/Obstetrics negative OB ROS                           Anesthesia Physical Anesthesia Plan  ASA: II  Anesthesia Plan: General   Post-op Pain Management:    Induction: Intravenous  Airway Management Planned: LMA  Additional Equipment:   Intra-op Plan:   Post-operative Plan:   Informed Consent: I have reviewed the patients History and Physical, chart, labs and discussed the procedure including the risks, benefits and alternatives for the proposed anesthesia with the patient or authorized representative who has indicated his/her understanding and acceptance.   Dental advisory given  Plan Discussed with: CRNA and Surgeon  Anesthesia Plan Comments:         Anesthesia Quick Evaluation

## 2013-04-22 NOTE — Progress Notes (Signed)
Dressing Lt upper arm with small amt red drainage. Dressings Rt flank and lt inner thigh dry and intact.

## 2013-04-22 NOTE — Interval H&P Note (Signed)
History and Physical Interval Note: no change in H and P except she has decided that she also wants the soft tissue mass on the right upper arm also removed.  04/22/2013 7:00 AM  Tammy [name]  has presented today for surgery, with the diagnosis of multiple soft tissue masses   The various methods of treatment have been discussed with the patient and family. After consideration of risks, benefits and other options for treatment, the patient has consented to  Procedure(s): EXCISION MULTPILE soft tissue masses and abnormal skin lesion left thigh /left chest/ left posterior shoulder/ right flank  (N/A) AND RIGHT UPPER ARM as a surgical intervention .  The patient's history has been reviewed, patient examined, no change in status, stable for surgery.  I have reviewed the patient's chart and labs.  Questions were answered to the patient's satisfaction.     [name]

## 2013-04-23 NOTE — Op Note (Signed)
NAMEJALEN, Tammy [name] NO.:  [id number]  MEDICAL RECORD NO.:  [id number]  LOCATION:  [facility]                         FACILITY:  [facility]  PHYSICIAN:  Abigail Miyamoto, M.D. DATE OF BIRTH:  [birth year]  DATE OF PROCEDURE:  04/22/2013 DATE OF DISCHARGE:  04/22/2013                              OPERATIVE REPORT   PREOPERATIVE DIAGNOSES: 1. Multiple soft tissue masses on left shoulder x2, right upper arm x1     and right flank. 2. Abnormal skin lesion, left thigh.  POSTOPERATIVE DIAGNOSES: 1. Multiple soft tissue masses on left shoulder x2, right upper arm x1     and right flank. 2. Abnormal skin lesion, left thigh.  PROCEDURES: 1. Excision of multiple soft tissue masses on left shoulder x2, right     arm x1, and left flank x1. 2. Excision of abnormal skin lesion of left thigh.  SURGEON:  Abigail Miyamoto, M.D.  ANESTHESIA:  General and 0.5% Marcaine.  ESTIMATED BLOOD LOSS:  Minimal.  FINDINGS:  The masses on the shoulder, arm, and flank were all consistent with lipomas.  The anterior shoulder lipoma was approximately 6 cm in size, the left posterior shoulder was 2 cm in size, the right upper arm was 2 cm in size, and the right flank was 12 cm in size.  The abnormal skin lesion was approximately 7 mm in size.  All were sent to pathology for evaluation.  PROCEDURE IN DETAIL:  The patient was brought to the operative room, identified as Tammy [name].  She was placed supine on operating table and general anesthesia was induced.  Her shoulder, arm, and leg were then prepped and draped in usual sterile fashion.  I made an anterior incision over the top of the first mass which was just below the clavicle laterally.  I took this down to the subcutaneous tissue with the cautery.  The mass was consistent with a large lipoma which I completely excised and sent to pathology for evaluation.  I then made a small skin incision on the left posterior shoulder and again  found a lipoma that I removed as well.  Hemostasis was achieved with cautery.  I closed both these incisions with interrupted 3-0 Vicryl sutures and running 4-0 Monocryl sutures.  Next, I made an elliptical incision on the abnormal skin lesion on the left thigh with a scalpel.  I took this down to subcutaneous tissue with electrocautery and removed the skin lesion in its entirety.  The skin lesion was sent separately to pathology.  I then closed subcutaneous tissue with interrupted 3-0 Vicryl sutures and closed the skin with running 4-0 Monocryl.  Next, I addressed the palpable mass in the right upper arm.  I made an incision with a scalpel over top of the mass.  I took this down to the subcutaneous tissue with electrocautery.  I then made to excise this mass in its entirety as well.  It appeared consistent with a lipoma.  I closed this with interrupted 3-0 Vicryl sutures and running 4-0 Monocryl as well.  The thigh and arm were likewise anesthetized with Marcaine. All the drapes were then taken down.  The patient was then placed in the left lateral decubitus position.  She  was re-prepped and draped on the right flank.  I then made a longitudinal incision on the site with a scalpel.  I took this down to subcutaneous tissue with electrocautery. The patient had a very large lipoma on the flank which was approximately 12 cm in size.  I was able to excise this in its entirety and sent to pathology for evaluation.  Hemostasis was achieved with cautery.  I anesthetized this with Marcaine and closed the subcutaneous tissue with interrupted 3-0 Vicryl sutures and closed the skin with running 4-0 Monocryl.  Steri-Strips, gauze, and tape were then applied to all wounds.  The patient tolerated the procedure well.  All counts were correct at the end of the procedure.  The patient was then extubated in operating room and taken in stable condition to recovery room.     Abigail Miyamoto,  M.D.     DB/MEDQ  D:  04/22/2013  T:  04/23/2013  Job:  [id number]

## 2013-04-26 ENCOUNTER — Ambulatory Visit: Payer: Medicare Other | Admitting: Physical Medicine & Rehabilitation

## 2013-04-26 ENCOUNTER — Encounter (HOSPITAL_COMMUNITY): Payer: Self-pay | Admitting: Surgery

## 2013-05-09 ENCOUNTER — Ambulatory Visit (INDEPENDENT_AMBULATORY_CARE_PROVIDER_SITE_OTHER): Payer: Medicare Other | Admitting: Surgery

## 2013-05-09 ENCOUNTER — Encounter (INDEPENDENT_AMBULATORY_CARE_PROVIDER_SITE_OTHER): Payer: Self-pay | Admitting: Surgery

## 2013-05-09 VITALS — BP 142/80 | HR 78 | Temp 97.3°F | Resp 14 | Ht 60.0 in | Wt 167.2 lb

## 2013-05-09 DIAGNOSIS — Z09 Encounter for follow-up examination after completed treatment for conditions other than malignant neoplasm: Secondary | ICD-10-CM

## 2013-05-09 NOTE — Patient Instructions (Signed)
n Given    fentaNYL injection (mcg) 25 mcg Given 04/22/13 0700 Valeda Malm, CRNA     75 mcg Given  0715 Valeda Malm, CRNA edited    25 mcg Given  0745 Valeda Malm, CRNA     75 mcg Given  0800 Valeda Malm, CRNA     25 mcg Given  0815 Valeda Malm, CRNA     25 mcg Given  0830 Valeda Malm, CRNA    ondansetron [facility]) injection 4 mg/2 ml (mg) 2 mg Given 04/22/13 0700 Valeda Malm, CRNA     2 mg Given  0715 Valeda Malm, CRNA    succinylcholine (ANECTINE) 20 mg/mL injection (mg) 100 mg Given 04/22/13 0715 Valeda Malm, CRNA    glycopyrrolate (ROBINUL) injection (mg) 0.1 mg Given 04/22/13 0981 Valeda Malm, CRNA    ketamine injection 10 mg/mL (mg) 10 mg Given 04/22/13 0741 Valeda Malm, CRNA    ePHEDrine 50 mg/mL injection (mg) 10 mg Given 04/22/13 0730 Valeda Malm, CRNA     10 mg Given  0741 Valeda Malm, CRNA     10 mg Given  0830 Valeda Malm, CRNA    propofol (DIPRIVAN) BOLUS only (mg) 150 mg Given 04/22/13 0723 Valeda Malm, CRNA     30 mg Given  0727 Valeda Malm, CRNA    ciprofloxacin (CIPRO) IVPB 400 mg (mg) 400 mg Given 04/22/13 0700 Valeda Malm, CRNA edited   Dosing weight: 79.2 kg           lactated ringers infusion (mL)  New Bag 04/22/13 0645 Valeda Malm, CRNA

## 2013-05-09 NOTE — Progress Notes (Signed)
Subjective:     Patient ID: Tammy [name], female   DOB: 09/08/1940, 72 y.o.   MRN: [id number]  HPI She is here for first postop visit status post excision of multiple lipomas and an abnormal skin lesion on her left leg. She is doing well  Review of Systems     Objective:   Physical Exam On exam, all the incisions are healing well. The final pathology showed a benign cyst and multiple lipomas    Assessment:     Patient stable postop     Plan:     She may resume normal activity. I will see her as needed

## 2013-05-13 ENCOUNTER — Ambulatory Visit (AMBULATORY_SURGERY_CENTER): Payer: Self-pay | Admitting: *Deleted

## 2013-05-13 ENCOUNTER — Telehealth: Payer: Self-pay

## 2013-05-13 VITALS — Ht 60.0 in | Wt 170.0 lb

## 2013-05-13 DIAGNOSIS — Z1211 Encounter for screening for malignant neoplasm of colon: Secondary | ICD-10-CM

## 2013-05-13 MED ORDER — NA SULFATE-K SULFATE-MG SULF 17.5-3.13-1.6 GM/177ML PO SOLN: ORAL | Status: DC

## 2013-05-13 NOTE — Telephone Encounter (Signed)
Per Dr. Elmyra Ricks request called pt to advise a pre-op visit to have form filled out.  Left a detailed message for pt to call office to make pre-op appt.

## 2013-05-13 NOTE — Progress Notes (Signed)
No egg or soy allergy 

## 2013-05-16 ENCOUNTER — Ambulatory Visit: Payer: Medicare Other | Admitting: Family Medicine

## 2013-05-17 ENCOUNTER — Encounter: Payer: Self-pay | Admitting: Internal Medicine

## 2013-05-18 ENCOUNTER — Encounter: Payer: Self-pay | Admitting: Family Medicine

## 2013-05-18 NOTE — Progress Notes (Signed)
REceived OV notes from PMR/Dr. Ethelene Hal from 9/23. Lumbar back pain, epidural inj advised. Advised tramadol . Pain psychologist or psychiatrist . Notes placed in scan box.

## 2013-05-27 ENCOUNTER — Ambulatory Visit (AMBULATORY_SURGERY_CENTER): Payer: Medicare Other | Admitting: Internal Medicine

## 2013-05-27 ENCOUNTER — Encounter: Payer: Self-pay | Admitting: Internal Medicine

## 2013-05-27 VITALS — BP 146/89 | HR 55 | Temp 98.2°F | Resp 22 | Ht 60.0 in | Wt 170.0 lb

## 2013-05-27 DIAGNOSIS — Z1211 Encounter for screening for malignant neoplasm of colon: Secondary | ICD-10-CM

## 2013-05-27 DIAGNOSIS — K648 Other hemorrhoids: Secondary | ICD-10-CM

## 2013-05-27 HISTORY — PX: COLONOSCOPY: SHX174

## 2013-05-27 MED ORDER — SODIUM CHLORIDE 0.9 % IV SOLN: 500.0000 mL | INTRAVENOUS | Status: DC

## 2013-05-27 NOTE — Op Note (Signed)
[facility] [address] [place], [address]   COLONOSCOPY PROCEDURE REPORT  PATIENT: Tammy [name], Tammy [name]  MR#: [id number] BIRTHDATE: 26-May-1941 , 72  yrs. old GENDER: Female ENDOSCOPIST: Iva Boop, MD, [facility] REFERRED BY:   Kriste Basque, DO PROCEDURE DATE:  05/27/2013 PROCEDURE:   Colonoscopy, screening First Screening Colonoscopy - Avg.  risk and is 50 yrs.  old or older Yes.  Prior Negative Screening - Now for repeat screening. N/A  History of Adenoma - Now for follow-up colonoscopy & has been > or = to 3 yrs.  N/A  Polyps Removed Today? No.  Recommend repeat exam, <10 yrs? No. ASA CLASS:   Class II INDICATIONS:average risk screening and first colonoscopy. MEDICATIONS: propofol (Diprivan) 200mg  IV, MAC sedation, administered by CRNA, and These medications were titrated to patient response per physician's verbal order  DESCRIPTION OF PROCEDURE:   After the risks benefits and alternatives of the procedure were thoroughly explained, informed consent was obtained.  A digital rectal exam revealed internal hemorrhoids and A digital rectal exam revealed no rectal mass. The LB PFC-H190 [id number]  endoscope was introduced through the anus and advanced to the cecum, which was identified by both the appendix and ileocecal valve. No adverse events experienced.   The quality of the prep was excellent using Suprep  The instrument was then slowly withdrawn as the colon was fully examined.      COLON FINDINGS: A normal appearing cecum, ileocecal valve, and appendiceal orifice were identified.  The ascending, hepatic flexure, transverse, splenic flexure, descending, sigmoid colon and rectum appeared unremarkable.  No polyps or cancers were seen.   A right colon retroflexion was performed.  Retroflexed views revealed internal hemorrhoids. The time to cecum=1 minutes 19 seconds. Withdrawal time=8 minutes 02 seconds.  The scope was withdrawn and the procedure  completed. COMPLICATIONS: There were no complications.  ENDOSCOPIC IMPRESSION: 1.   Normal colon - excellent prep - first colonoscopy 2.   Internal hemorrhoids grade 2 - RP>RA>LL  RECOMMENDATIONS: Does not need routine colonoscopy or hemoccults going forward. My office will call her to arrange an appointment to treat hemorrhoids with band ligation.   eSigned:  Iva Boop, MD, [facility] 05/27/2013 10:27 AM   cc: The Patient  and Kriste Basque, DO

## 2013-05-27 NOTE — Progress Notes (Signed)
Patient did not have preoperative order for IV antibiotic SSI prophylaxis. (G8918)  Patient did not experience any of the following events: a burn prior to discharge; a fall within the facility; wrong site/side/patient/procedure/implant event; or a hospital transfer or hospital admission upon discharge from the facility. (G8907)  

## 2013-05-27 NOTE — Patient Instructions (Addendum)
You have hemorrhoids as you are aware but no polyps or cancer!  You do not need routine colonoscopy or testing for blood in the stool going forward.  My office will contact you to arrange an appointment about banding your hemorrhoids.  I appreciate the opportunity to care for you. Iva Boop, MD, FACG  YOU HAD AN ENDOSCOPIC PROCEDURE TODAY AT THE [facility]: Refer to the procedure report that was given to you for any specific questions about what was found during the examination.  If the procedure report does not answer your questions, please call your gastroenterologist to clarify.  If you requested that your care partner not be given the details of your procedure findings, then the procedure report has been included in a sealed envelope for you to review at your convenience later.  YOU SHOULD EXPECT: Some feelings of bloating in the abdomen. Passage of more gas than usual.  Walking can help get rid of the air that was put into your GI tract during the procedure and reduce the bloating. If you had a lower endoscopy (such as a colonoscopy or flexible sigmoidoscopy) you may notice spotting of blood in your stool or on the toilet paper. If you underwent a bowel prep for your procedure, then you may not have a normal bowel movement for a few days.  DIET: Your first meal following the procedure should be a light meal and then it is ok to progress to your normal diet.  A half-sandwich or bowl of soup is an example of a good first meal.  Heavy or fried foods are harder to digest and may make you feel nauseous or bloated.  Likewise meals heavy in dairy and vegetables can cause extra gas to form and this can also increase the bloating.  Drink plenty of fluids but you should avoid alcoholic beverages for 24 hours.  ACTIVITY: Your care partner should take you home directly after the procedure.  You should plan to take it easy, moving slowly for the rest of the day.  You can resume normal  activity the day after the procedure however you should NOT DRIVE or use heavy machinery for 24 hours (because of the sedation medicines used during the test).    SYMPTOMS TO REPORT IMMEDIATELY: A gastroenterologist can be reached at any hour.  During normal business hours, 8:30 AM to 5:00 PM Monday through Friday, call [phone].  After hours and on weekends, please call the GI answering service at [phone] who will take a message and have the physician on call contact you.   Following lower endoscopy (colonoscopy or flexible sigmoidoscopy):  Excessive amounts of blood in the stool  Significant tenderness or worsening of abdominal pains  Swelling of the abdomen that is new, acute  Fever of 100F or higher FOLLOW UP: If any biopsies were taken you will be contacted by phone or by letter within the next 1-3 weeks.  Call your gastroenterologist if you have not heard about the biopsies in 3 weeks.  Our staff will call the home number listed on your records the next business day following your procedure to check on you and address any questions or concerns that you may have at that time regarding the information given to you following your procedure. This is a courtesy call and so if there is no answer at the home number and we have not heard from you through the emergency physician on call, we will assume that you have returned to  your regular daily activities without incident.  SIGNATURES/CONFIDENTIALITY: You and/or your care partner have signed paperwork which will be entered into your electronic medical record.  These signatures attest to the fact that that the information above on your After Visit Summary has been reviewed and is understood.  Full responsibility of the confidentiality of this discharge information lies with you and/or your care-partner. 

## 2013-05-27 NOTE — Progress Notes (Signed)
Report to pacu rn, vss, bbs=clear 

## 2013-05-30 ENCOUNTER — Telehealth: Payer: Self-pay | Admitting: *Deleted

## 2013-05-30 NOTE — Telephone Encounter (Signed)
  Follow up Call-  Call back number 05/27/2013  Post procedure Call Back phone  # [phone]  Permission to leave phone message Yes     No answer,left message.

## 2013-05-31 ENCOUNTER — Encounter: Payer: Self-pay | Admitting: Family Medicine

## 2013-05-31 NOTE — Patient Instructions (Signed)
Received office notes from [facility] for lumbar epidural steroid injection- follow up in 2 weeks.

## 2013-06-03 ENCOUNTER — Encounter: Payer: Self-pay | Admitting: Internal Medicine

## 2013-06-03 ENCOUNTER — Ambulatory Visit (INDEPENDENT_AMBULATORY_CARE_PROVIDER_SITE_OTHER): Payer: Medicare Other | Admitting: Internal Medicine

## 2013-06-03 VITALS — BP 138/78 | HR 80 | Ht 60.25 in | Wt 165.0 lb

## 2013-06-03 DIAGNOSIS — K648 Other hemorrhoids: Secondary | ICD-10-CM

## 2013-06-03 HISTORY — PX: HEMORRHOID BANDING: SHX5850

## 2013-06-03 NOTE — Progress Notes (Signed)
Patient ID: Tammy [name], female   DOB: [birth year], 72 y.o.   MRN: [id number]  PROCEDURE NOTE: The patient presents with symptomatic grade 2  hemorrhoids, requesting rubber band ligation of her hemorrhoidal disease.  All risks, benefits and alternative forms of therapy were described and informed consent was obtained.   The anorectum was pre-medicated with 0.125% NTG ointment The decision was made to band the RA and RP internal hemorrhoids, and the [facility] O'Regan System was used to perform band ligation without complication.  Digital anorectal examination was then performed to assure proper positioning of the band, and to adjust the banded tissue as required.  The patient was discharged home without pain or other issues.  Dietary and behavioral recommendations were given and along with follow-up instructions.     The following adjunctive treatments were recommended:  None  The patient will return in 2-3 weeks for  follow-up and possible additional banding as required. No complications were encountered and the patient tolerated the procedure well.

## 2013-06-03 NOTE — Assessment & Plan Note (Signed)
RP and RA banded To RTC 2-3 weeks for f/u and repeat banding Does not need fiber

## 2013-06-03 NOTE — Patient Instructions (Signed)
HEMORRHOID BANDING PROCEDURE    FOLLOW-UP CARE   1. The procedure you have had should have been relatively painless since the banding of the area involved does not have nerve endings and there is no pain sensation.  The rubber band cuts off the blood supply to the hemorrhoid and the band may fall off as soon as 48 hours after the banding (the band may occasionally be seen in the toilet bowl following a bowel movement). You may notice a temporary feeling of fullness in the rectum which should respond adequately to plain Tylenol or Motrin.  2. Following the banding, avoid strenuous exercise that evening and resume full activity the next day.  A sitz bath (soaking in a warm tub) or bidet is soothing, and can be useful for cleansing the area after bowel movements.     3. To avoid constipation, take two tablespoons of natural wheat bran, natural oat bran, flax, Benefiber or any over the counter fiber supplement and increase your water intake to 7-8 glasses daily.    4. Unless you have been prescribed anorectal medication, do not put anything inside your rectum for two weeks: No suppositories, enemas, fingers, etc.  5. Occasionally, you may have more bleeding than usual after the banding procedure.  This is often from the untreated hemorrhoids rather than the treated one.  Don't be concerned if there is a tablespoon or so of blood.  If there is more blood than this, lie flat with your bottom higher than your head and apply an ice pack to the area. If the bleeding does not stop within a half an hour or if you feel faint, call our office at [phone] or go to the emergency room.  6. Problems are not common; however, if there is a substantial amount of bleeding, severe pain, chills, fever or difficulty passing urine (very rare) or other problems, you should call us at [phone] or report to the nearest emergency room.  7. Do not stay seated continuously for more than 2-3 hours for a day or two  after the procedure.  Tighten your buttock muscles 10-15 times every two hours and take 10-15 deep breaths every 1-2 hours.  Do not spend more than a few minutes on the toilet if you cannot empty your bowel; instead re-visit the toilet at a later time.    It is the time of year to have a vaccination to prevent the flu (influenza virus).  Please have this done through your primary care provider or you can get this done at local pharmacies or the [facility]. It would be very helpful if you notify your primary care provider when and where you had the vaccination given by messaging them in My Chart, leaving a message or faxing the information.  Follow up with [place] in 3 weeks.   I appreciate the opportunity to care for you.

## 2013-06-17 ENCOUNTER — Encounter: Payer: Self-pay | Admitting: Family Medicine

## 2013-06-17 NOTE — Patient Instructions (Signed)
Received notes from [facility] from 06/09/13.  Continue ultram and follow up in 2 months.  Sent to scan.

## 2013-06-30 ENCOUNTER — Other Ambulatory Visit: Payer: Self-pay

## 2013-07-08 ENCOUNTER — Other Ambulatory Visit: Payer: Self-pay | Admitting: Orthopedic Surgery

## 2013-07-11 ENCOUNTER — Ambulatory Visit (INDEPENDENT_AMBULATORY_CARE_PROVIDER_SITE_OTHER): Payer: Medicare Other | Admitting: Family Medicine

## 2013-07-11 ENCOUNTER — Encounter (HOSPITAL_COMMUNITY): Payer: Self-pay | Admitting: Pharmacy Technician

## 2013-07-11 ENCOUNTER — Encounter: Payer: Self-pay | Admitting: Family Medicine

## 2013-07-11 VITALS — BP 140/84 | Temp 98.6°F | Wt 163.0 lb

## 2013-07-11 DIAGNOSIS — M25569 Pain in unspecified knee: Secondary | ICD-10-CM

## 2013-07-11 DIAGNOSIS — M549 Dorsalgia, unspecified: Secondary | ICD-10-CM

## 2013-07-11 DIAGNOSIS — Z01818 Encounter for other preprocedural examination: Secondary | ICD-10-CM

## 2013-07-11 DIAGNOSIS — G8929 Other chronic pain: Secondary | ICD-10-CM

## 2013-07-11 NOTE — Progress Notes (Signed)
Pre visit review using our clinic review tool, if applicable. No additional management support is needed unless otherwise documented below in the visit note. 

## 2013-07-11 NOTE — Progress Notes (Signed)
Chief Complaint  Patient presents with  . Medical Clearance    HPI:  Patient is seen for optimization of general medical care prior to surgery. Surgery type: R knee replacement with Dr. Lequita Halt Date of surgery: Dec 1st 2014  Kidney disease? No Prior surgeries/Issues following anesthesia? Multiple - see PSH, no problems with anesthesia in the past Hx MI, heart arrythmia, CHF, angina or stroke? none Epilepsy or Seizures? none Arthritis or problems with neck or jaw? none Thyroid disease? none Liver disease? none Asthma, COPD or chronic lung disease? none Diabetes? none  Other: Poor nutrition, Frail or other: no  METS:  ?Can take care of self, such as eat, dress, or use the toilet (1 MET). yes ?Can walk up a flight of steps or a hill (4 METs).yes ?Can do heavy work around the house such as scrubbing floors or lifting or moving heavy furniture (between 4 and 10 METs). yes  AHA Risks: Major predictors that require intensive management and may lead to delay in or cancellation of the operative procedure unless emergent: NONE   Other clinical predictors that warrant careful assessment of current status: NONE  Type of surgery and Risk: Intermediate risk   Medications that need to be addressed prior to surgery: ASA, Lodine  ROS: See pertinent positives and negatives per HPI. 11 point ROS negative except where noted. Occ acid reflux and R knee pain and chronic back pain. Otherwise she feels great.  Past Medical History  Diagnosis Date  . Chicken pox   . Varicose vein   . Depression     mild, never on medication  . GERD (gastroesophageal reflux disease)   . Heart murmur     told in 2012 benign  . Hyperglycemia     borderline  . Anxiety   . Arthritis     knees and back  . High cholesterol     borderline, no meds  . Hypertension     high blood pressure readings, no meds    Past Surgical History  Procedure Laterality Date  . Tonsillectomy and adenoidectomy  1973  .  Abdominal hysterectomy  1972  . Incision and drainage breast abscess Left 1967  . Ovarian cyst surgery Bilateral 1991  . Tubal ligation  1970  . Dental surgery  2007    left jaw bone graft, 2 implants on left, 2 implants on right  . Hemorroidectomy  1975  . Mass excision N/A 04/22/2013    Procedure: EXCISION MULTPILE soft tissue masses and abnormal skin lesion left thigh /left chest/ left posterior shoulder/ right flank ;  Surgeon: Shelly Rubenstein, MD;  Location: [facility];  Service: General;  Laterality: N/A;  multiple sites:  left thigh, left shoulder, right flank, right upper arm  . Ovarian cyst surgery    . Colonoscopy  05/27/2013    Family History  Problem Relation Age of Onset  . Alcohol abuse Father   . Heart disease Father   . Arthritis Mother   . Stroke Mother   . Hypertension Mother   . Diabetes Mother   . Breast cancer Maternal Grandmother   . Breast cancer Maternal Aunt   . Colon cancer Maternal Uncle   . Esophageal cancer Neg Hx   . Stomach cancer Neg Hx   . Rectal cancer Neg Hx     History   Social History  . Marital Status: Divorced    Spouse Name: N/A    Number of Children: N/A  . Years of Education: N/A  Social History Main Topics  . Smoking status: Never Smoker   . Smokeless tobacco: Never Used  . Alcohol Use: 0.0 oz/week     Comment: occasional  . Drug Use: No  . Sexual Activity: None   Other Topics Concern  . 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 , Disp: , Rfl: ;  calcium carbonate (TUMS) 500 MG chewable tablet, Chew 2 tablets by mouth every evening., Disp: , Rfl: ;  CAPSAICIN & MENTHOL EX, Apply 1 application  topically 2 (two) times daily as needed (apply to knees back and neck and needed for pain)., Disp: , Rfl:  DOXYLAMINE SUCCINATE PO, Take 0.5-1 tablets by mouth at bedtime as needed (for sleep)., Disp: , Rfl: ;  etodolac (LODINE) 400 MG tablet, Take 400 mg by mouth 2 (two) times daily., Disp: , Rfl: ;  omeprazole (PRILOSEC) 40 MG capsule, Take 40 mg by mouth daily., Disp: , Rfl: ;  oxyCODONE-acetaminophen (PERCOCET/ROXICET) 5-325 MG per tablet, Take 1 tablet by mouth every 4 (four) hours as needed for moderate pain or severe pain., Disp: , Rfl:  Scar Treatment Products (MEDERMA) GEL, Apply 1 application topically daily., Disp: , Rfl: ;  traMADol (ULTRAM) 50 MG tablet, Take 50 mg by mouth every 8 (eight) hours as needed for pain., Disp: , Rfl:   EXAM:  Filed Vitals:   07/11/13 1308  BP: 140/84  Temp: 98.6 F (37 C)    Body mass index is 31.58 kg/(m^2).  GENERAL: vitals reviewed and listed above, alert, oriented, appears well hydrated and in no acute distress  HEENT: atraumatic, conjunttiva clear, no obvious abnormalities on inspection of external nose and ears  NECK: no obvious masses on inspection, no carotid bruits  LUNGS: clear to auscultation bilaterally, no wheezes, rales or rhonchi, good air movement  CV: HRRR, no peripheral edema, no JVD, BP normal range, normal radial pulses  MS: moves all extremities without noticeable abnormality  PSYCH: pleasant and cooperative, no obvious depression or anxiety  ASSESSMENT AND PLAN:  Discussed the following assessment and plan:  Pre-op exam  Chronic back pain  Knee pain, chronic, right  Assessment: -Risk factors: none other then age, she is a very active and vibrant 72 yo old -Surgery Risks:intermediate -age, nutritional status, fraility: good nutritional status, age >67, no fraility -functional capacity: > 4 METs wethout symptoms -comorbidities: none Patient Specific Risks: patient is low risk for intermediate risks  surgery  Recommendations for optimizing general medical care prior to surgery: -advised patient to discuss specific risks morbidity and mortality of surgery with surgeon, CV risks discussed with patient -advised patient will defer to surgeon for post-op DVT prophylaxis and post op care -specific medical recommendations: stop asa and lodine 1 week before surgery -at this time no recommendations to defer surgery or for further CV testing prior to surgery  -Patient advised to return or notify a doctor immediately if symptoms worsen or persist or new concerns arise.  There are no Patient Instructions on file for this visit.   Kriste Basque R.

## 2013-07-13 ENCOUNTER — Encounter (HOSPITAL_COMMUNITY)
Admission: RE | Admit: 2013-07-13 | Discharge: 2013-07-13 | Disposition: A | Discharge status: Home / Self Care | Payer: Medicare Other | Source: Ambulatory Visit | Attending: Orthopedic Surgery | Admitting: Orthopedic Surgery

## 2013-07-13 ENCOUNTER — Encounter (HOSPITAL_COMMUNITY): Payer: Self-pay

## 2013-07-13 DIAGNOSIS — Z01812 Encounter for preprocedural laboratory examination: Secondary | ICD-10-CM | POA: Insufficient documentation

## 2013-07-13 DIAGNOSIS — Z01818 Encounter for other preprocedural examination: Secondary | ICD-10-CM | POA: Insufficient documentation

## 2013-07-13 HISTORY — DX: Other intervertebral disc degeneration, lumbar region without mention of lumbar back pain or lower extremity pain: M51.369

## 2013-07-13 HISTORY — DX: Unspecified hemorrhoids: K64.9

## 2013-07-13 HISTORY — DX: Scoliosis, unspecified: M41.9

## 2013-07-13 HISTORY — DX: Unspecified osteoarthritis, unspecified site: M19.90

## 2013-07-13 HISTORY — DX: Other intervertebral disc degeneration, lumbar region: M51.36

## 2013-07-13 LAB — PROTIME-INR
INR: 0.96 (ref 0.00–1.49)
Prothrombin Time: 12.6 seconds (ref 11.6–15.2)

## 2013-07-13 LAB — URINALYSIS, ROUTINE W REFLEX MICROSCOPIC
Ketones, ur: 15 mg/dL — AB
Nitrite: NEGATIVE
Urobilinogen, UA: 1 mg/dL (ref 0.0–1.0)

## 2013-07-13 LAB — SURGICAL PCR SCREEN
MRSA, PCR: NEGATIVE
Staphylococcus aureus: POSITIVE — AB

## 2013-07-13 LAB — BASIC METABOLIC PANEL
CO2: 26 mEq/L (ref 19–32)
Chloride: 102 mEq/L (ref 96–112)
Creatinine, Ser: 0.79 mg/dL (ref 0.50–1.10)
Glucose, Bld: 96 mg/dL (ref 70–99)
Sodium: 137 mEq/L (ref 135–145)

## 2013-07-13 LAB — CBC
Hemoglobin: 14.1 g/dL (ref 12.0–15.0)
MCV: 90 fL (ref 78.0–100.0)
Platelets: 197 10*3/uL (ref 150–400)
RBC: 4.58 MIL/uL (ref 3.87–5.11)
WBC: 5.2 10*3/uL (ref 4.0–10.5)

## 2013-07-13 NOTE — Patient Instructions (Signed)
20 [name]  07/13/2013   Your procedure is scheduled on: 07/25/13  Report to [facility] at 7:30 AM.  Call this number if you have problems the morning of surgery [phone]: [phone]   Remember:   Do not eat food or drink liquids After Midnight.     Take these medicines the morning of surgery with A SIP OF WATER: omeprazole, lodine    Do not wear jewelry, make-up or nail polish.  Do not wear lotions, powders, or perfumes. You may wear deodorant.  Do not shave 48 hours prior to surgery. Men may shave face and neck.  Do not bring valuables to the hospital.  Contacts, dentures or bridgework may not be worn into surgery.  Leave suitcase in the car. After surgery it may be brought to your room.  For patients admitted to the hospital, checkout time is 11:00 AM the day of discharge.   Please read over the following fact sheets that you were given: MRSA Information, incentive spirometry fact sheet, blood fact sheet. Birdie Sons, RN  pre op nurse call if needed [phone]    FAILURE TO FOLLOW THESE INSTRUCTIONS MAY RESULT IN CANCELLATION OF YOUR SURGERY   Patient Signature: ___________________________________________

## 2013-07-13 NOTE — Progress Notes (Signed)
Please write pre-op orders. Pt is having surgery 07/25/13. Thank you.

## 2013-07-13 NOTE — Progress Notes (Addendum)
Chest x-ray 04/20/13 on EPIC, EKG 04/20/13 on EPIC, surgery clearance note Dr. Selena Batten 07/11/13 on chart, LOV note 07/11/13 Dr. Selena Batten on chart, list of anesthesia medications given last surgery 05/09/13 on chart per patient request, pt would prefer Dr. Okey Dupre for anesthesia if possible.

## 2013-07-14 ENCOUNTER — Telehealth: Payer: Self-pay | Admitting: Family Medicine

## 2013-07-14 ENCOUNTER — Other Ambulatory Visit: Payer: Self-pay | Admitting: Orthopedic Surgery

## 2013-07-14 NOTE — Progress Notes (Signed)
Preoperative surgical orders have been place into the Epic hospital system for Celesta Aver on 07/14/2013, 10:22 PM  by Patrica Duel for surgery on 07/25/13.  Preop Total Knee orders including Experal, PO Tylenol, and IV Decadron as long as there are no contraindications to the above medications. Avel Peace, PA-C

## 2013-07-14 NOTE — Telephone Encounter (Signed)
Pt calling to state she is concerned because she was unable to have her TSH checked because she was told by her PCP that she does not check TSH.  Pt requesting to have order for TSH and also requesting to have rx for Protonix as the Prilosec is not working and pharmacist agreed that Protonix would probable work better.

## 2013-07-15 MED ORDER — PANTOPRAZOLE SODIUM 40 MG PO TBEC: 40.0000 mg | DELAYED_RELEASE_TABLET | Freq: Every day | ORAL | Status: DC

## 2013-07-15 NOTE — Telephone Encounter (Signed)
Called and spoke with pt and pt states the she will have right knee replacement and states the PA told pt that a thyroid should be done at pt's yearly checkup.   Pt states she had mentioned that the she was still taking the Tums and told to get Zantac OTC.  Pt called the pharmacy and was told to Prontoix 40 mg was stronger. Made pt aware that bone density has been entered into the system.

## 2013-07-15 NOTE — Telephone Encounter (Signed)
1) not sure who told her we don't do tsh? Do need reason for checking it as it is not a recommended screening test to do for no reason. Is she having symptoms that make her worry about thyroid disease?  2)ok to do trial of protonix - needs follow up appointment in 1-2 months to ensure this is working

## 2013-07-18 NOTE — Progress Notes (Signed)
Pt called to clarify medications to take before and on the day of surgery. Instructed not to take lodine, but can take ultram and protonix. Pt agreed with no concerns.

## 2013-07-24 ENCOUNTER — Other Ambulatory Visit: Payer: Self-pay | Admitting: Orthopedic Surgery

## 2013-07-24 NOTE — H&P (Signed)
[name]  DOB: 04/08/1941 Divorced / Language: English / Race: White Female  Date of Admission:  07/25/2013  Chief Complaint:  Right Knee Pain  History of Present Illness The patient is a 72 year old female who comes in for a preoperative History and Physical. The patient is scheduled for a right total knee arthroplasty to be performed by Dr. Frank V. Aluisio, MD at [name] on 07/25/2013. The patient reports left knee and right knee symptoms including: pain, swelling and stiffness which began year(s) ago without any known injury.The patient feels that the symptoms are worsening. Prior to being seen today the patient was previously evaluated by a colleague ([name]) 3 week(s) ago. Previous work-up for this problem has included knee x-rays. Past treatment for this problem has included intra-articular injection of corticosteroids (Patient states that the injections did help. ). [name] comes in with significant pain, right worse than left knee. Pain has been going on for many years. It has gotten progressively worse this past year. She has had steroid injections in the past, which initially provided benefit, but recent injections have not provided any benefit for her. Functionally she is having much more difficult time being able to even do activities of living. The right knee is definitely limiting her. The left knee hurts also, but the right is worse pain wise and functionally. She is at a stage now where she wants to go ahead and get the knee fixed. They have been treated conservatively in the past for the above stated problem and despite conservative measures, they continue to have progressive pain and severe functional limitations and dysfunction. They have failed non-operative management including home exercise, medications, and injections. It is felt that they would benefit from undergoing total joint replacement. Risks and benefits of the procedure  have been discussed with the patient and they elect to proceed with surgery. There are no active contraindications to surgery such as ongoing infection or rapidly progressive neurological disease.   Problem List Osteoarthritis, Knee (715.96)   Allergies Hydrocodone w/APAP *ANALGESICS - OPIOID*. agitation **Patient is able to take Oxycodone** Penicillins    Family History Hypertension. Mother. mother Congestive Heart Failure. mother Cerebrovascular Accident. mother Heart Disease. father and grandmother fathers side Diabetes Mellitus. mother Cancer. father, brother and grandmother mothers side Heart disease in female family member before age 55 Osteoporosis. mother Osteoarthritis. mother    Social History Drug/Alcohol Rehab (Previously). no Drug/Alcohol Rehab (Currently). no Illicit drug use. no Exercise. Exercises daily; does other Current work status. retired Children. 4 Alcohol use. current drinker; drinks beer, wine and hard liquor; 8-14 per week Living situation. live alone Number of flights of stairs before winded. 2-3 Marital status. divorced Tobacco use. Never smoker. never smoker Pain Contract. no Post-Surgical Plans. Plan is to go to [facility] following surgery.    Medication History Omeprazole (40MG Capsule DR, Oral) Active. Etodolac (400MG Tablet, 1 (one) Tablet Oral two times daily, Taken starting 04/18/2013) Active. (ask/tmp erx) Oxycodone-Acetaminophen (5-325MG Tablet, Oral) Active. Ultram (50MG Tablet, 1 (one) Oral three times daily, as needed, Taken starting 06/09/2013) Active. Calcium 1000 + D (1000-800MG-UNIT Tablet, Oral) Active. Doxylamine Succinate (25MG Tablet, Oral) Active. Capsaicin (0.15% Liquid, External) Active. Aspirin EC (325MG Tablet DR, Oral) Active.    Past Surgical History Hysterectomy. Date: 1973. partial (non-cancerous) Hemorrhoidectomy. Date: 05/2013. Tubal Ligation. Date:  1970. Tonsillectomy   Medical History Heart murmur Osteoarthritis Depression. occasional Chronic Pain Anxiety Disorder Impaired Memory. mild Hypertension Varicose veins Gastroesophageal Reflux Disease Hemorrhoids   Degenerative Disc Disease    Review of Systems General:Not Present- Chills, Fever, Night Sweats, Fatigue, Weight Gain, Weight Loss and Memory Loss. Skin:Not Present- Hives, Itching, Rash, Eczema and Lesions. HEENT:Present- Headache and Ringing in the Ears. Not Present- Tinnitus, Double Vision, Visual Loss, Hearing Loss and Dentures. Respiratory:Not Present- Shortness of breath with exertion, Shortness of breath at rest, Allergies, Coughing up blood and Chronic Cough. Cardiovascular:Not Present- Chest Pain, Racing/skipping heartbeats, Difficulty Breathing Lying Down, Murmur, Swelling and Palpitations. Gastrointestinal:Present- Abdominal Pain and Nausea. Not Present- Bloody Stool, Heartburn, Vomiting, Constipation, Diarrhea, Difficulty Swallowing, Jaundice and Loss of appetitie. Female Genitourinary:Not Present- Blood in Urine, Urinary frequency, Weak urinary stream, Discharge, Flank Pain, Incontinence, Painful Urination, Urgency, Urinary Retention and Urinating at Night. Musculoskeletal:Present- Muscle Weakness, Muscle Pain, Joint Pain, Back Pain, Morning Stiffness and Spasms. Not Present- Joint Swelling. Neurological:Not Present- Tremor, Dizziness, Blackout spells, Paralysis, Difficulty with balance and Weakness. Psychiatric:Not Present- Insomnia.    Vitals Weight: 161 lb Height: 60 in Weight was reported by patient. Height was reported by patient. Body Surface Area: 1.76 m Body Mass Index: 31.44 kg/m Pulse: 84 (Regular) Resp.: 16 (Unlabored) BP: 158/94 (Sitting, Right Arm, Standard)     Physical Exam The physical exam findings are as follows:   General Mental Status - Alert, cooperative and good historian. General  Appearance- pleasant. Not in acute distress. Orientation- Oriented X3. Build & Nutrition- Well nourished and Well developed.   Head and Neck Head- normocephalic, atraumatic . Neck Global Assessment- supple. no bruit auscultated on the right and no bruit auscultated on the left.   Eye Pupil- Bilateral- Regular and Round. Motion- Bilateral- EOMI.   Chest and Lung Exam Auscultation: Breath sounds:- clear at anterior chest wall and - clear at posterior chest wall. Adventitious sounds:- No Adventitious sounds.   Cardiovascular Auscultation:Rhythm- Regular rate and rhythm. Heart Sounds- S1 WNL and S2 WNL. Murmurs & Other Heart Sounds:Auscultation of the heart reveals - No Murmurs.   Abdomen Palpation/Percussion:Tenderness- Abdomen is non-tender to palpation. Rigidity (guarding)- Abdomen is soft. Auscultation:Auscultation of the abdomen reveals - Bowel sounds normal.   Female Genitourinary Not done, not pertinent to present illness  Musculoskeletal  On exam very pleasant, well developed female alert and oriented in no apparent distress. Both hips show normal range of motion and no discomfort. Her left knee shows no effusion. There is moderate crepitus on range of motion of the left knee. Range about 5 to 125. Some tenderness medial greater than lateral with no instability. The right knee no effusion. Range from 10 to 120. Varus deformity present. Marked crepitus on range of motion. Tenderness medial and lateral with no instability. Pulses sensation and motor intact distally.  RADIOGRAPHS: AP both knees and lateral show that she has severe bone on bone arthritis of the right knee medial and patellofemoral even with lateral involvement. The left knee also near bone on bone medial and patellofemoral, but not as bad as the right.   Assessment & Plan Osteoarthritis, Knee (715.96) Impression: Right Knee  Note: Plan is for a Right Total Knee  Replacement and a left knee cortisone injection by Dr. Aluisio.  Plan is to go to [facility] foloowing the [name] stay. She states she uses a TruBack brace while in bed for back support.  PCP - Kim Hannah  The patient does not have any contraindications and will receive TXA (tranexamic acid) prior to surgery.  Drew Emelio Schneller, PA-C  

## 2013-07-25 ENCOUNTER — Encounter (HOSPITAL_COMMUNITY)
Admission: RE | Disposition: A | Discharge status: Skilled Nursing Facility | Payer: Self-pay | Source: Ambulatory Visit | Attending: Orthopedic Surgery

## 2013-07-25 ENCOUNTER — Inpatient Hospital Stay (HOSPITAL_COMMUNITY)
Admission: RE | Admit: 2013-07-25 | Discharge: 2013-07-27 | DRG: 470 | Disposition: A | Discharge status: Skilled Nursing Facility | Payer: Medicare Other | Source: Ambulatory Visit | Attending: Orthopedic Surgery | Admitting: Orthopedic Surgery

## 2013-07-25 ENCOUNTER — Encounter (HOSPITAL_COMMUNITY): Payer: Medicare Other | Admitting: Anesthesiology

## 2013-07-25 ENCOUNTER — Inpatient Hospital Stay (HOSPITAL_COMMUNITY): Payer: Medicare Other | Admitting: Anesthesiology

## 2013-07-25 ENCOUNTER — Encounter (HOSPITAL_COMMUNITY): Payer: Self-pay | Admitting: *Deleted

## 2013-07-25 DIAGNOSIS — E871 Hypo-osmolality and hyponatremia: Secondary | ICD-10-CM

## 2013-07-25 DIAGNOSIS — Z833 Family history of diabetes mellitus: Secondary | ICD-10-CM

## 2013-07-25 DIAGNOSIS — I10 Essential (primary) hypertension: Secondary | ICD-10-CM | POA: Diagnosis present

## 2013-07-25 DIAGNOSIS — Z7982 Long term (current) use of aspirin: Secondary | ICD-10-CM

## 2013-07-25 DIAGNOSIS — Z8249 Family history of ischemic heart disease and other diseases of the circulatory system: Secondary | ICD-10-CM

## 2013-07-25 DIAGNOSIS — Z96651 Presence of right artificial knee joint: Secondary | ICD-10-CM

## 2013-07-25 DIAGNOSIS — Z823 Family history of stroke: Secondary | ICD-10-CM

## 2013-07-25 DIAGNOSIS — M4120 Other idiopathic scoliosis, site unspecified: Secondary | ICD-10-CM | POA: Diagnosis present

## 2013-07-25 DIAGNOSIS — E785 Hyperlipidemia, unspecified: Secondary | ICD-10-CM | POA: Diagnosis present

## 2013-07-25 DIAGNOSIS — Z01812 Encounter for preprocedural laboratory examination: Secondary | ICD-10-CM

## 2013-07-25 DIAGNOSIS — M179 Osteoarthritis of knee, unspecified: Secondary | ICD-10-CM | POA: Diagnosis present

## 2013-07-25 DIAGNOSIS — G8929 Other chronic pain: Secondary | ICD-10-CM | POA: Diagnosis present

## 2013-07-25 DIAGNOSIS — Z8262 Family history of osteoporosis: Secondary | ICD-10-CM

## 2013-07-25 DIAGNOSIS — M1710 Unilateral primary osteoarthritis, unspecified knee: Principal | ICD-10-CM | POA: Diagnosis present

## 2013-07-25 DIAGNOSIS — D62 Acute posthemorrhagic anemia: Secondary | ICD-10-CM

## 2013-07-25 DIAGNOSIS — K219 Gastro-esophageal reflux disease without esophagitis: Secondary | ICD-10-CM | POA: Diagnosis present

## 2013-07-25 DIAGNOSIS — Z6831 Body mass index (BMI) 31.0-31.9, adult: Secondary | ICD-10-CM

## 2013-07-25 HISTORY — PX: TOTAL KNEE ARTHROPLASTY: SHX125

## 2013-07-25 LAB — TYPE AND SCREEN: ABO/RH(D): A POS

## 2013-07-25 SURGERY — ARTHROPLASTY, KNEE, TOTAL
Anesthesia: Spinal | Site: Knee | Laterality: Right | Wound class: Clean

## 2013-07-25 MED ORDER — VANCOMYCIN HCL IN DEXTROSE 1-5 GM/200ML-% IV SOLN
1000.0000 mg | Freq: Once | INTRAVENOUS | Status: AC
  Administered 2013-07-25: 1000 mg via INTRAVENOUS

## 2013-07-25 MED ORDER — BUPIVACAINE HCL (PF) 0.25 % IJ SOLN
INTRAMUSCULAR | Status: AC
  Filled 2013-07-25: qty 30

## 2013-07-25 MED ORDER — ONDANSETRON HCL 4 MG PO TABS: 4.0000 mg | ORAL_TABLET | Freq: Four times a day (QID) | ORAL | Status: DC | PRN

## 2013-07-25 MED ORDER — KETOROLAC TROMETHAMINE 15 MG/ML IJ SOLN: 7.5000 mg | Freq: Four times a day (QID) | INTRAMUSCULAR | Status: AC | PRN

## 2013-07-25 MED ORDER — PROPOFOL INFUSION 10 MG/ML OPTIME
INTRAVENOUS | Status: DC | PRN
  Administered 2013-07-25: 100 ug/kg/min via INTRAVENOUS

## 2013-07-25 MED ORDER — BUPIVACAINE IN DEXTROSE 0.75-8.25 % IT SOLN
INTRATHECAL | Status: DC | PRN
  Administered 2013-07-25: 1.6 mL via INTRATHECAL

## 2013-07-25 MED ORDER — DEXAMETHASONE SODIUM PHOSPHATE 10 MG/ML IJ SOLN
10.0000 mg | Freq: Every day | INTRAMUSCULAR | Status: AC
  Filled 2013-07-25: qty 1

## 2013-07-25 MED ORDER — SODIUM CHLORIDE 0.9 % IJ SOLN
INTRAMUSCULAR | Status: DC | PRN
  Administered 2013-07-25: 13:00:00

## 2013-07-25 MED ORDER — ACETAMINOPHEN 500 MG PO TABS
1000.0000 mg | ORAL_TABLET | Freq: Four times a day (QID) | ORAL | Status: AC
  Administered 2013-07-25 – 2013-07-26 (×4): 1000 mg via ORAL
  Filled 2013-07-25 (×5): qty 2

## 2013-07-25 MED ORDER — SODIUM CHLORIDE 0.9 % IR SOLN
Status: DC | PRN
  Administered 2013-07-25: 1000 mL

## 2013-07-25 MED ORDER — BUPIVACAINE HCL 0.25 % IJ SOLN
INTRAMUSCULAR | Status: DC | PRN
  Administered 2013-07-25: 20 mL

## 2013-07-25 MED ORDER — HYDROMORPHONE HCL PF 1 MG/ML IJ SOLN: 0.2500 mg | INTRAMUSCULAR | Status: DC | PRN

## 2013-07-25 MED ORDER — FENTANYL CITRATE 0.05 MG/ML IJ SOLN
INTRAMUSCULAR | Status: AC
  Filled 2013-07-25: qty 2

## 2013-07-25 MED ORDER — DEXAMETHASONE SODIUM PHOSPHATE 10 MG/ML IJ SOLN
INTRAMUSCULAR | Status: AC
  Filled 2013-07-25: qty 2

## 2013-07-25 MED ORDER — DEXAMETHASONE SODIUM PHOSPHATE 10 MG/ML IJ SOLN
10.0000 mg | Freq: Once | INTRAMUSCULAR | Status: AC
  Administered 2013-07-25: 10 mg via INTRAVENOUS

## 2013-07-25 MED ORDER — MENTHOL 3 MG MT LOZG: 1.0000 | LOZENGE | OROMUCOSAL | Status: DC | PRN

## 2013-07-25 MED ORDER — LIDOCAINE HCL (CARDIAC) 20 MG/ML IV SOLN
INTRAVENOUS | Status: AC
  Filled 2013-07-25: qty 5

## 2013-07-25 MED ORDER — OXYCODONE HCL 5 MG PO TABS
5.0000 mg | ORAL_TABLET | ORAL | Status: DC | PRN
  Administered 2013-07-25: 10 mg via ORAL
  Administered 2013-07-25 (×2): 5 mg via ORAL
  Administered 2013-07-26 – 2013-07-27 (×6): 10 mg via ORAL
  Filled 2013-07-25: qty 2
  Filled 2013-07-25: qty 1
  Filled 2013-07-25 (×4): qty 2
  Filled 2013-07-25: qty 1
  Filled 2013-07-25 (×4): qty 2

## 2013-07-25 MED ORDER — 0.9 % SODIUM CHLORIDE (POUR BTL) OPTIME
TOPICAL | Status: DC | PRN
  Administered 2013-07-25: 1000 mL

## 2013-07-25 MED ORDER — MIDAZOLAM HCL 2 MG/2ML IJ SOLN
INTRAMUSCULAR | Status: AC
  Filled 2013-07-25: qty 2

## 2013-07-25 MED ORDER — SODIUM CHLORIDE 0.9 % IJ SOLN
INTRAMUSCULAR | Status: AC
  Filled 2013-07-25: qty 10

## 2013-07-25 MED ORDER — METHOCARBAMOL 500 MG PO TABS
500.0000 mg | ORAL_TABLET | Freq: Four times a day (QID) | ORAL | Status: DC | PRN
  Administered 2013-07-25 – 2013-07-26 (×2): 500 mg via ORAL
  Filled 2013-07-25 (×2): qty 1

## 2013-07-25 MED ORDER — ACETAMINOPHEN 500 MG PO TABS
1000.0000 mg | ORAL_TABLET | Freq: Once | ORAL | Status: AC
  Administered 2013-07-25: 1000 mg via ORAL
  Filled 2013-07-25: qty 2

## 2013-07-25 MED ORDER — PROMETHAZINE HCL 25 MG/ML IJ SOLN: 6.2500 mg | INTRAMUSCULAR | Status: DC | PRN

## 2013-07-25 MED ORDER — DOCUSATE SODIUM 100 MG PO CAPS
100.0000 mg | ORAL_CAPSULE | Freq: Two times a day (BID) | ORAL | Status: DC
  Administered 2013-07-25 – 2013-07-27 (×4): 100 mg via ORAL

## 2013-07-25 MED ORDER — METOCLOPRAMIDE HCL 5 MG/ML IJ SOLN: 5.0000 mg | Freq: Three times a day (TID) | INTRAMUSCULAR | Status: DC | PRN

## 2013-07-25 MED ORDER — RIVAROXABAN 10 MG PO TABS
10.0000 mg | ORAL_TABLET | Freq: Every day | ORAL | Status: DC
  Administered 2013-07-26 – 2013-07-27 (×2): 10 mg via ORAL
  Filled 2013-07-25 (×3): qty 1

## 2013-07-25 MED ORDER — MIDAZOLAM HCL 5 MG/5ML IJ SOLN
INTRAMUSCULAR | Status: DC | PRN
  Administered 2013-07-25 (×4): 0.5 mg via INTRAVENOUS

## 2013-07-25 MED ORDER — KETAMINE HCL 10 MG/ML IJ SOLN
INTRAMUSCULAR | Status: DC | PRN
  Administered 2013-07-25: 10 mg via INTRAVENOUS
  Administered 2013-07-25: 15 mg via INTRAVENOUS

## 2013-07-25 MED ORDER — KETAMINE HCL 50 MG/ML IJ SOLN
INTRAMUSCULAR | Status: AC
  Filled 2013-07-25: qty 10

## 2013-07-25 MED ORDER — MORPHINE SULFATE 2 MG/ML IJ SOLN
1.0000 mg | INTRAMUSCULAR | Status: DC | PRN
  Administered 2013-07-25: 1 mg via INTRAVENOUS
  Filled 2013-07-25: qty 1

## 2013-07-25 MED ORDER — FLEET ENEMA 7-19 GM/118ML RE ENEM: 1.0000 | ENEMA | Freq: Once | RECTAL | Status: AC | PRN

## 2013-07-25 MED ORDER — BISACODYL 10 MG RE SUPP
10.0000 mg | Freq: Every day | RECTAL | Status: DC | PRN
Start: 2013-07-25 — End: 2013-07-27

## 2013-07-25 MED ORDER — ONDANSETRON HCL 4 MG/2ML IJ SOLN
INTRAMUSCULAR | Status: DC | PRN
  Administered 2013-07-25: 2 mg via INTRAVENOUS

## 2013-07-25 MED ORDER — DEXAMETHASONE 6 MG PO TABS
10.0000 mg | ORAL_TABLET | Freq: Every day | ORAL | Status: AC
  Administered 2013-07-26: 10 mg via ORAL
  Filled 2013-07-25: qty 1

## 2013-07-25 MED ORDER — BUPIVACAINE LIPOSOME 1.3 % IJ SUSP
20.0000 mL | Freq: Once | INTRAMUSCULAR | Status: DC
  Filled 2013-07-25: qty 20

## 2013-07-25 MED ORDER — CHLORHEXIDINE GLUCONATE 4 % EX LIQD: 60.0000 mL | Freq: Once | CUTANEOUS | Status: DC

## 2013-07-25 MED ORDER — METHOCARBAMOL 100 MG/ML IJ SOLN
500.0000 mg | Freq: Four times a day (QID) | INTRAVENOUS | Status: DC | PRN
  Administered 2013-07-25: 500 mg via INTRAVENOUS
  Filled 2013-07-25 (×2): qty 5

## 2013-07-25 MED ORDER — ONDANSETRON HCL 4 MG/2ML IJ SOLN: 4.0000 mg | Freq: Four times a day (QID) | INTRAMUSCULAR | Status: DC | PRN

## 2013-07-25 MED ORDER — ACETAMINOPHEN 650 MG RE SUPP: 650.0000 mg | Freq: Four times a day (QID) | RECTAL | Status: DC | PRN

## 2013-07-25 MED ORDER — VANCOMYCIN HCL IN DEXTROSE 1-5 GM/200ML-% IV SOLN
INTRAVENOUS | Status: AC
  Filled 2013-07-25: qty 200

## 2013-07-25 MED ORDER — DIPHENHYDRAMINE HCL 12.5 MG/5ML PO ELIX: 12.5000 mg | ORAL_SOLUTION | ORAL | Status: DC | PRN

## 2013-07-25 MED ORDER — LACTATED RINGERS IV SOLN
INTRAVENOUS | Status: DC | PRN
  Administered 2013-07-25 (×2): via INTRAVENOUS

## 2013-07-25 MED ORDER — METHYLPREDNISOLONE ACETATE 40 MG/ML IJ SUSP
INTRAMUSCULAR | Status: AC
  Filled 2013-07-25: qty 2

## 2013-07-25 MED ORDER — SODIUM CHLORIDE 0.9 % IV BOLUS (SEPSIS)
INTRAVENOUS | Status: DC | PRN
  Administered 2013-07-25 (×2): 5 mL via INTRAVENOUS

## 2013-07-25 MED ORDER — PHENOL 1.4 % MT LIQD: 1.0000 | OROMUCOSAL | Status: DC | PRN

## 2013-07-25 MED ORDER — CEFAZOLIN SODIUM-DEXTROSE 2-3 GM-% IV SOLR: 2.0000 g | INTRAVENOUS | Status: DC

## 2013-07-25 MED ORDER — ONDANSETRON HCL 4 MG/2ML IJ SOLN
INTRAMUSCULAR | Status: AC
  Filled 2013-07-25: qty 2

## 2013-07-25 MED ORDER — SODIUM CHLORIDE 0.9 % IJ SOLN
INTRAMUSCULAR | Status: AC
  Filled 2013-07-25: qty 50

## 2013-07-25 MED ORDER — METOCLOPRAMIDE HCL 10 MG PO TABS: 5.0000 mg | ORAL_TABLET | Freq: Three times a day (TID) | ORAL | Status: DC | PRN

## 2013-07-25 MED ORDER — DEXTROSE-NACL 5-0.9 % IV SOLN
INTRAVENOUS | Status: DC
  Administered 2013-07-25: 16:00:00 via INTRAVENOUS

## 2013-07-25 MED ORDER — ACETAMINOPHEN 325 MG PO TABS: 650.0000 mg | ORAL_TABLET | Freq: Four times a day (QID) | ORAL | Status: DC | PRN

## 2013-07-25 MED ORDER — TRAMADOL HCL 50 MG PO TABS
50.0000 mg | ORAL_TABLET | Freq: Four times a day (QID) | ORAL | Status: DC | PRN
  Administered 2013-07-26: 50 mg via ORAL
  Administered 2013-07-26: 100 mg via ORAL
  Administered 2013-07-26: 50 mg via ORAL
  Administered 2013-07-26 – 2013-07-27 (×2): 100 mg via ORAL
  Filled 2013-07-25: qty 2
  Filled 2013-07-25 (×2): qty 1
  Filled 2013-07-25 (×2): qty 2

## 2013-07-25 MED ORDER — PROPOFOL 10 MG/ML IV BOLUS
INTRAVENOUS | Status: AC
  Filled 2013-07-25: qty 20

## 2013-07-25 MED ORDER — EPHEDRINE SULFATE 50 MG/ML IJ SOLN
INTRAMUSCULAR | Status: DC | PRN
  Administered 2013-07-25 (×3): 5 mg via INTRAVENOUS
  Administered 2013-07-25: 10 mg via INTRAVENOUS

## 2013-07-25 MED ORDER — POLYETHYLENE GLYCOL 3350 17 G PO PACK
17.0000 g | PACK | Freq: Every day | ORAL | Status: DC | PRN
  Administered 2013-07-27: 17 g via ORAL

## 2013-07-25 MED ORDER — METHYLPREDNISOLONE ACETATE 80 MG/ML IJ SUSP
INTRAMUSCULAR | Status: DC | PRN
  Administered 2013-07-25: 80 mg via INTRA_ARTICULAR

## 2013-07-25 MED ORDER — EPHEDRINE SULFATE 50 MG/ML IJ SOLN
INTRAMUSCULAR | Status: AC
  Filled 2013-07-25: qty 1

## 2013-07-25 MED ORDER — PANTOPRAZOLE SODIUM 40 MG PO TBEC
40.0000 mg | DELAYED_RELEASE_TABLET | Freq: Every day | ORAL | Status: DC
  Administered 2013-07-26 – 2013-07-27 (×2): 40 mg via ORAL
  Filled 2013-07-25 (×3): qty 1

## 2013-07-25 MED ORDER — VANCOMYCIN HCL 1000 MG IV SOLR
INTRAVENOUS | Status: AC
  Filled 2013-07-25: qty 1000

## 2013-07-25 MED ORDER — TRANEXAMIC ACID 100 MG/ML IV SOLN
1000.0000 mg | INTRAVENOUS | Status: AC
  Administered 2013-07-25: 1000 mg via INTRAVENOUS
  Filled 2013-07-25: qty 10

## 2013-07-25 MED ORDER — LACTATED RINGERS IV SOLN
INTRAVENOUS | Status: DC
  Administered 2013-07-25: 1000 mL via INTRAVENOUS

## 2013-07-25 MED ORDER — FENTANYL CITRATE 0.05 MG/ML IJ SOLN
INTRAMUSCULAR | Status: DC | PRN
  Administered 2013-07-25 (×2): 25 ug via INTRAVENOUS

## 2013-07-25 MED ORDER — SODIUM CHLORIDE 0.9 % IV SOLN: INTRAVENOUS | Status: DC

## 2013-07-25 MED ORDER — VANCOMYCIN HCL IN DEXTROSE 1-5 GM/200ML-% IV SOLN
1000.0000 mg | Freq: Two times a day (BID) | INTRAVENOUS | Status: AC
  Administered 2013-07-25: 1000 mg via INTRAVENOUS
  Filled 2013-07-25: qty 200

## 2013-07-25 SURGICAL SUPPLY — 59 items
BAG ZIPLOCK 12X15 (MISCELLANEOUS) ×2 IMPLANT
BANDAGE ELASTIC 6 VELCRO ST LF (GAUZE/BANDAGES/DRESSINGS) ×2 IMPLANT
BANDAGE ESMARK 6X9 LF (GAUZE/BANDAGES/DRESSINGS) ×1 IMPLANT
BLADE SAG 18X100X1.27 (BLADE) ×2 IMPLANT
BLADE SAW SGTL 11.0X1.19X90.0M (BLADE) ×2 IMPLANT
BNDG ESMARK 6X9 LF (GAUZE/BANDAGES/DRESSINGS) ×2
BOWL SMART MIX CTS (DISPOSABLE) ×2 IMPLANT
CAPT RP KNEE ×2 IMPLANT
CEMENT HV SMART SET (Cement) ×4 IMPLANT
CUFF TOURN SGL QUICK 34 (TOURNIQUET CUFF) ×1
CUFF TRNQT CYL 34X4X40X1 (TOURNIQUET CUFF) ×1 IMPLANT
DECANTER SPIKE VIAL GLASS SM (MISCELLANEOUS) ×2 IMPLANT
DRAPE EXTREMITY T 121X128X90 (DRAPE) ×2 IMPLANT
DRAPE POUCH INSTRU U-SHP 10X18 (DRAPES) ×2 IMPLANT
DRAPE U-SHAPE 47X51 STRL (DRAPES) ×2 IMPLANT
DRSG ADAPTIC 3X8 NADH LF (GAUZE/BANDAGES/DRESSINGS) ×2 IMPLANT
DRSG PAD ABDOMINAL 8X10 ST (GAUZE/BANDAGES/DRESSINGS) ×2 IMPLANT
DURAPREP 26ML APPLICATOR (WOUND CARE) ×2 IMPLANT
ELECT REM PT RETURN 9FT ADLT (ELECTROSURGICAL) ×2
ELECTRODE REM PT RTRN 9FT ADLT (ELECTROSURGICAL) ×1 IMPLANT
EVACUATOR 1/8 PVC DRAIN (DRAIN) ×2 IMPLANT
FACESHIELD LNG OPTICON STERILE (SAFETY) ×10 IMPLANT
GLOVE BIO SURGEON STRL SZ7.5 (GLOVE) ×2 IMPLANT
GLOVE BIO SURGEON STRL SZ8 (GLOVE) ×2 IMPLANT
GLOVE BIOGEL PI IND STRL 6.5 (GLOVE) ×2 IMPLANT
GLOVE BIOGEL PI IND STRL 7.0 (GLOVE) ×2 IMPLANT
GLOVE BIOGEL PI IND STRL 8 (GLOVE) ×2 IMPLANT
GLOVE BIOGEL PI INDICATOR 6.5 (GLOVE) ×2
GLOVE BIOGEL PI INDICATOR 7.0 (GLOVE) ×2
GLOVE BIOGEL PI INDICATOR 8 (GLOVE) ×2
GLOVE SURG SS PI 6.5 STRL IVOR (GLOVE) ×2 IMPLANT
GOWN PREVENTION PLUS LG XLONG (DISPOSABLE) ×2 IMPLANT
GOWN STRL NON-REIN LRG LVL3 (GOWN DISPOSABLE) ×2 IMPLANT
GOWN STRL REIN XL XLG (GOWN DISPOSABLE) ×4 IMPLANT
HANDPIECE INTERPULSE COAX TIP (DISPOSABLE) ×1
IMMOBILIZER KNEE 20 (SOFTGOODS) ×2
IMMOBILIZER KNEE 20 THIGH 36 (SOFTGOODS) ×1 IMPLANT
KIT BASIN OR (CUSTOM PROCEDURE TRAY) ×2 IMPLANT
MANIFOLD NEPTUNE II (INSTRUMENTS) ×2 IMPLANT
NDL SAFETY ECLIPSE 18X1.5 (NEEDLE) ×2 IMPLANT
NEEDLE HYPO 18GX1.5 SHARP (NEEDLE) ×2
NS IRRIG 1000ML POUR BTL (IV SOLUTION) ×2 IMPLANT
PACK TOTAL JOINT (CUSTOM PROCEDURE TRAY) ×2 IMPLANT
PADDING CAST COTTON 6X4 STRL (CAST SUPPLIES) ×6 IMPLANT
POSITIONER SURGICAL ARM (MISCELLANEOUS) ×2 IMPLANT
SET HNDPC FAN SPRY TIP SCT (DISPOSABLE) ×1 IMPLANT
SPONGE GAUZE 4X4 12PLY (GAUZE/BANDAGES/DRESSINGS) ×2 IMPLANT
STRIP CLOSURE SKIN 1/2X4 (GAUZE/BANDAGES/DRESSINGS) ×2 IMPLANT
SUCTION FRAZIER 12FR DISP (SUCTIONS) ×2 IMPLANT
SUT MNCRL AB 4-0 PS2 18 (SUTURE) ×2 IMPLANT
SUT VIC AB 2-0 CT1 27 (SUTURE) ×3
SUT VIC AB 2-0 CT1 TAPERPNT 27 (SUTURE) ×3 IMPLANT
SUT VLOC 180 0 24IN GS25 (SUTURE) ×2 IMPLANT
SYR 20CC LL (SYRINGE) ×2 IMPLANT
SYR 50ML LL SCALE MARK (SYRINGE) ×2 IMPLANT
TOWEL OR 17X26 10 PK STRL BLUE (TOWEL DISPOSABLE) ×4 IMPLANT
TRAY FOLEY CATH 14FRSI W/METER (CATHETERS) ×2 IMPLANT
WATER STERILE IRR 1500ML POUR (IV SOLUTION) ×2 IMPLANT
WRAP KNEE MAXI GEL POST OP (GAUZE/BANDAGES/DRESSINGS) ×2 IMPLANT

## 2013-07-25 NOTE — Transfer of Care (Signed)
Immediate Anesthesia Transfer of Care Note  Patient: [name]  Procedure(s) Performed: Procedure(s) with comments: RIGHT TOTAL KNEE ARTHROPLASTY (Right) - Left knee cortisone injection  Patient Location: PACU  Anesthesia Type:Spinal  Level of Consciousness: awake, alert , oriented and patient cooperative  Airway & Oxygen Therapy: Patient Spontanous Breathing and Patient connected to face mask oxygen  Post-op Assessment: Report given to PACU RN and Post -op Vital signs reviewed and stable  Post vital signs: stable  Complications: No apparent anesthesia complications

## 2013-07-25 NOTE — Anesthesia Postprocedure Evaluation (Signed)
  Anesthesia Post-op Note  Patient: [name]  Procedure(s) Performed: Procedure(s) (LRB): RIGHT TOTAL KNEE ARTHROPLASTY (Right)  Patient Location: PACU  Anesthesia Type: Spinal  Level of Consciousness: awake and alert   Airway and Oxygen Therapy: Patient Spontanous Breathing  Post-op Pain: mild  Post-op Assessment: Post-op Vital signs reviewed, Patient's Cardiovascular Status Stable, Respiratory Function Stable, Patent Airway and No signs of Nausea or vomiting  Last Vitals:  Filed Vitals:   07/25/13 1646  BP: 131/71  Pulse: 77  Temp: 36.4 C  Resp: 16    Post-op Vital Signs: stable   Complications: No apparent anesthesia complications

## 2013-07-25 NOTE — H&P (View-Only) (Signed)
[name]  DOB: [birth year] Divorced / Language: [place] / Race: White Female  Date of Admission:  07/25/2013  Chief Complaint:  Right Knee Pain  History of Present Illness The patient is a 72 year old female who comes in for a preoperative History and Physical. The patient is scheduled for a right total knee arthroplasty to be performed by Dr. Gus Rankin. Aluisio, MD at [facility] on 07/25/2013. The patient reports left knee and right knee symptoms including: pain, swelling and stiffness which began year(s) ago without any known injury.The patient feels that the symptoms are worsening. Prior to being seen today the patient was previously evaluated by a colleague (Dr.Kendall) 3 week(s) ago. Previous work-up for this problem has included knee x-rays. Past treatment for this problem has included intra-articular injection of corticosteroids (Patient states that the injections did help. ). [name] comes in with significant pain, right worse than left knee. Pain has been going on for many years. It has gotten progressively worse this past year. She has had steroid injections in the past, which initially provided benefit, but recent injections have not provided any benefit for her. Functionally she is having much more difficult time being able to even do activities of living. The right knee is definitely limiting her. The left knee hurts also, but the right is worse pain wise and functionally. She is at a stage now where she wants to go ahead and get the knee fixed. They have been treated conservatively in the past for the above stated problem and despite conservative measures, they continue to have progressive pain and severe functional limitations and dysfunction. They have failed non-operative management including home exercise, medications, and injections. It is felt that they would benefit from undergoing total joint replacement. Risks and benefits of the procedure  have been discussed with the patient and they elect to proceed with surgery. There are no active contraindications to surgery such as ongoing infection or rapidly progressive neurological disease.   Problem List Osteoarthritis, Knee (715.96)   Allergies Hydrocodone w/APAP *ANALGESICS - OPIOID*. agitation **Patient is able to take Oxycodone** Penicillins    Family History Hypertension. Mother. mother Congestive Heart Failure. mother Cerebrovascular Accident. mother Heart Disease. father and grandmother fathers side Diabetes Mellitus. mother Cancer. father, brother and grandmother mothers side Heart disease in female family member before age 20 Osteoporosis. mother Osteoarthritis. mother    Social History Drug/Alcohol Rehab (Previously). no Drug/Alcohol Rehab (Currently). no Illicit drug use. no Exercise. Exercises daily; does other Current work status. retired Copywriter, advertising. 4 Alcohol use. current drinker; drinks beer, wine and hard liquor; 8-14 per week Living situation. live alone Number of flights of stairs before winded. 2-3 Marital status. divorced Tobacco use. Never smoker. never smoker Pain Contract. no Post-Surgical Plans. Plan is to go to [facility] following surgery.    Medication History Omeprazole (40MG  Capsule DR, Oral) Active. Etodolac (400MG  Tablet, 1 (one) Tablet Oral two times daily, Taken starting 04/18/2013) Active. (ask/tmp erx) Oxycodone-Acetaminophen (5-325MG  Tablet, Oral) Active. Ultram (50MG  Tablet, 1 (one) Oral three times daily, as needed, Taken starting 06/09/2013) Active. Calcium 1000 + D (1000-800MG -UNIT Tablet, Oral) Active. Doxylamine Succinate (25MG  Tablet, Oral) Active. Capsaicin (0.15% Liquid, External) Active. Aspirin EC (325MG  Tablet DR, Oral) Active.    Past Surgical History Hysterectomy. Date: 19. partial (non-cancerous) Hemorrhoidectomy. Date: 05/2013. Tubal Ligation. Date:  16. Tonsillectomy   Medical History Heart murmur Osteoarthritis Depression. occasional Chronic Pain Anxiety Disorder Impaired Memory. mild Hypertension Varicose veins Gastroesophageal Reflux Disease Hemorrhoids  Degenerative Disc Disease    Review of Systems General:Not Present- Chills, Fever, Night Sweats, Fatigue, Weight Gain, Weight Loss and Memory Loss. Skin:Not Present- Hives, Itching, Rash, Eczema and Lesions. HEENT:Present- Headache and Ringing in the Ears. Not Present- Tinnitus, Double Vision, Visual Loss, Hearing Loss and Dentures. Respiratory:Not Present- Shortness of breath with exertion, Shortness of breath at rest, Allergies, Coughing up blood and Chronic Cough. Cardiovascular:Not Present- Chest Pain, Racing/skipping heartbeats, Difficulty Breathing Lying Down, Murmur, Swelling and Palpitations. Gastrointestinal:Present- Abdominal Pain and Nausea. Not Present- Bloody Stool, Heartburn, Vomiting, Constipation, Diarrhea, Difficulty Swallowing, Jaundice and Loss of appetitie. Female Genitourinary:Not Present- Blood in Urine, Urinary frequency, Weak urinary stream, Discharge, Flank Pain, Incontinence, Painful Urination, Urgency, Urinary Retention and Urinating at Night. Musculoskeletal:Present- Muscle Weakness, Muscle Pain, Joint Pain, Back Pain, Morning Stiffness and Spasms. Not Present- Joint Swelling. Neurological:Not Present- Tremor, Dizziness, Blackout spells, Paralysis, Difficulty with balance and Weakness. Psychiatric:Not Present- Insomnia.    Vitals Weight: 161 lb Height: 60 in Weight was reported by patient. Height was reported by patient. Body Surface Area: 1.76 m Body Mass Index: 31.44 kg/m Pulse: 84 (Regular) Resp.: 16 (Unlabored) BP: 158/94 (Sitting, Right Arm, Standard)     Physical Exam The physical exam findings are as follows:   General Mental Status - Alert, cooperative and good historian. General  Appearance- pleasant. Not in acute distress. Orientation- Oriented X3. Build & Nutrition- Well nourished and Well developed.   Head and Neck Head- normocephalic, atraumatic . Neck Global Assessment- supple. no bruit auscultated on the right and no bruit auscultated on the left.   Eye Pupil- Bilateral- Regular and Round. Motion- Bilateral- EOMI.   Chest and Lung Exam Auscultation: Breath sounds:- clear at anterior chest wall and - clear at posterior chest wall. Adventitious sounds:- No Adventitious sounds.   Cardiovascular Auscultation:Rhythm- Regular rate and rhythm. Heart Sounds- S1 WNL and S2 WNL. Murmurs & Other Heart Sounds:Auscultation of the heart reveals - No Murmurs.   Abdomen Palpation/Percussion:Tenderness- Abdomen is non-tender to palpation. Rigidity (guarding)- Abdomen is soft. Auscultation:Auscultation of the abdomen reveals - Bowel sounds normal.   Female Genitourinary Not done, not pertinent to present illness  Musculoskeletal  On exam very pleasant, well developed female alert and oriented in no apparent distress. Both hips show normal range of motion and no discomfort. Her left knee shows no effusion. There is moderate crepitus on range of motion of the left knee. Range about 5 to 125. Some tenderness medial greater than lateral with no instability. The right knee no effusion. Range from 10 to 120. Varus deformity present. Marked crepitus on range of motion. Tenderness medial and lateral with no instability. Pulses sensation and motor intact distally.  RADIOGRAPHS: AP both knees and lateral show that she has severe bone on bone arthritis of the right knee medial and patellofemoral even with lateral involvement. The left knee also near bone on bone medial and patellofemoral, but not as bad as the right.   Assessment & Plan Osteoarthritis, Knee (715.96) Impression: Right Knee  Note: Plan is for a Right Total Knee  Replacement and a left knee cortisone injection by Dr. Lequita Halt.  Plan is to go to [facility] foloowing the hospital stay. She states she uses a TruBack brace while in bed for back support.  PCP - Kriste Basque  The patient does not have any contraindications and will receive TXA (tranexamic acid) prior to surgery.  Avel Peace, PA-C

## 2013-07-25 NOTE — Op Note (Addendum)
Pre-operative diagnosis- Osteoarthritis  Bilateral knee(s)  Post-operative diagnosis- Osteoarthritis Bilateral knee(s)  Procedure-  Right  Total Knee Arthroplasty   Left knee cortisone injection   Surgeon- Gus Rankin. Elese Rane, MD  Assistant- Avel Peace, PA-C   Anesthesia-  Spinal  EBL-* No blood loss amount entered *   Drains Hemovac  Tourniquet time-  Total Tourniquet Time Documented: Thigh (Right) - 26 minutes Total: Thigh (Right) - 26 minutes    Complications- None  Condition-PACU - hemodynamically stable.   Brief Clinical Note  [name] is a 72 y.o. year old female with end stage OA of her right knee with progressively worsening pain and dysfunction. She has constant pain, with activity and at rest and significant functional deficits with difficulties even with ADLs. She has had extensive non-op management including analgesics, injections of cortisone, and home exercise program, but remains in significant pain with significant dysfunction.Radiographs show bone on bone arthritis medial and patellofemoral with large varus deformity. She presents now for right Total Knee Arthroplasty.     Procedure in detail---   The patient is brought into the operating room and positioned supine on the operating table. After successful administration of  Spinal,   a tourniquet is placed high on the  Right thigh(s) and the lower extremity is prepped and draped in the usual sterile fashion. Time out is performed by the operating team and then the  Right lower extremity is wrapped in Esmarch, knee flexed and the tourniquet inflated to 300 mmHg.       A midline incision is made with a ten blade through the subcutaneous tissue to the level of the extensor mechanism. A fresh blade is used to make a medial parapatellar arthrotomy. Soft tissue over the proximal medial tibia is subperiosteally elevated to the joint line with a knife and into the semimembranosus bursa with a Cobb elevator. Soft  tissue over the proximal lateral tibia is elevated with attention being paid to avoiding the patellar tendon on the tibial tubercle. The patella is everted, knee flexed 90 degrees and the ACL and PCL are removed. Findings are bone on bone medial and patellofemoral with large global osteophytes and large varus deformity.        The drill is used to create a starting hole in the distal femur and the canal is thoroughly irrigated with sterile saline to remove the fatty contents. The 5 degree Right  valgus alignment guide is placed into the femoral canal and the distal femoral cutting block is pinned to remove 10 mm off the distal femur. Resection is made with an oscillating saw.      The tibia is subluxed forward and the menisci are removed. The extramedullary alignment guide is placed referencing proximally at the medial aspect of the tibial tubercle and distally along the second metatarsal axis and tibial crest. The block is pinned to remove 2mm off the more deficient medial  side. Resection is made with an oscillating saw. Size 3is the most appropriate size for the tibia and the proximal tibia is prepared with the modular drill and keel punch for that size.      The femoral sizing guide is placed and size 3 is most appropriate. Rotation is marked off the epicondylar axis and confirmed by creating a rectangular flexion gap at 90 degrees. The size 3 cutting block is pinned in this rotation and the anterior, posterior and chamfer cuts are made with the oscillating saw. The intercondylar block is then placed and that cut  is made.      Trial size 3 tibial component, trial size 3 posterior stabilized femur and a 12.5  mm posterior stabilized rotating platform insert trial is placed. Full extension is achieved with excellent varus/valgus and anterior/posterior balance throughout full range of motion. The patella is everted and thickness measured to be 24  mm. Free hand resection is taken to 14 mm, a 35 template is  placed, lug holes are drilled, trial patella is placed, and it tracks normally. Osteophytes are removed off the posterior femur with the trial in place. All trials are removed and the cut bone surfaces prepared with pulsatile lavage. Cement is mixed and once ready for implantation, the size 3 tibial implant, size  3 posterior stabilized femoral component, and the size 35 patella are cemented in place and the patella is held with the clamp. The trial insert is placed and the knee held in full extension. The Exparel (20 ml mixed with 30 ml saline) and .25% Bupivicaine, are injected into the extensor mechanism, posterior capsule, medial and lateral gutters and subcutaneous tissues.  All extruded cement is removed and once the cement is hard the permanent 12.5 mm posterior stabilized rotating platform insert is placed into the tibial tray.      The wound is copiously irrigated with saline solution and the extensor mechanism closed over a hemovac drain with #1 PDS suture. The tourniquet is released for a total tourniquet time of 26  minutes. Flexion against gravity is 140 degrees and the patella tracks normally. Subcutaneous tissue is closed with 2.0 vicryl and subcuticular with running 4.0 Monocryl. The incision is cleaned and dried and steri-strips and a bulky sterile dressing are applied. The limb is placed into a knee immobilizer and then I prepped the left knee with Betadine and injected the joint with 80 mg of Depomedrol without problems. The patient is awakened and transported to recovery in stable condition.      Please note that a surgical assistant was a medical necessity for this procedure in order to perform it in a safe and expeditious manner. Surgical assistant was necessary to retract the ligaments and vital neurovascular structures to prevent injury to them and also necessary for proper positioning of the limb to allow for anatomic placement of the prosthesis.   Gus Rankin Margarie Mcguirt, MD    07/25/2013,  12:44 PM

## 2013-07-25 NOTE — Progress Notes (Signed)
Utilization review completed.  

## 2013-07-25 NOTE — Interval H&P Note (Signed)
History and Physical Interval Note:  07/25/2013 9:39 AM  [name]  has presented today for surgery, with the diagnosis of OSTEOARTHRITIS RIGHT KNEE  The various methods of treatment have been discussed with the patient and family. After consideration of risks, benefits and other options for treatment, the patient has consented to  Procedure(s): RIGHT TOTAL KNEE ARTHROPLASTY (Right) as a surgical intervention .  The patient's history has been reviewed, patient examined, no change in status, stable for surgery.  I have reviewed the patient's chart and labs.  Questions were answered to the patient's satisfaction.     Loanne Drilling

## 2013-07-25 NOTE — Anesthesia Procedure Notes (Signed)

## 2013-07-25 NOTE — Plan of Care (Signed)
Problem: Consults Goal: Diagnosis- Total Joint Replacement Primary Total Knee     

## 2013-07-25 NOTE — Anesthesia Preprocedure Evaluation (Addendum)
Anesthesia Evaluation  Patient identified by MRN, date of birth, ID band Patient awake    Reviewed: Allergy & Precautions, H&P , NPO status , Patient's Chart, lab work & pertinent test results  Airway Mallampati: II TM Distance: >3 FB Neck ROM: Full    Dental no notable dental hx.    Pulmonary neg pulmonary ROS,  breath sounds clear to auscultation  Pulmonary exam normal       Cardiovascular hypertension, Pt. on medications Rhythm:Regular Rate:Normal     Neuro/Psych negative neurological ROS  negative psych ROS   GI/Hepatic negative GI ROS, Neg liver ROS,   Endo/Other  negative endocrine ROS  Renal/GU negative Renal ROS  negative genitourinary   Musculoskeletal negative musculoskeletal ROS (+)   Abdominal   Peds negative pediatric ROS (+)  Hematology negative hematology ROS (+)   Anesthesia Other Findings   Reproductive/Obstetrics negative OB ROS                           Anesthesia Physical Anesthesia Plan  ASA: II  Anesthesia Plan: Spinal   Post-op Pain Management:    Induction: Intravenous  Airway Management Planned: Simple Face Mask  Additional Equipment:   Intra-op Plan:   Post-operative Plan:   Informed Consent: I have reviewed the patients History and Physical, chart, labs and discussed the procedure including the risks, benefits and alternatives for the proposed anesthesia with the patient or authorized representative who has indicated his/her understanding and acceptance.   Dental advisory given  Plan Discussed with: CRNA and Surgeon  Anesthesia Plan Comments:         Anesthesia Quick Evaluation  

## 2013-07-26 DIAGNOSIS — E871 Hypo-osmolality and hyponatremia: Secondary | ICD-10-CM

## 2013-07-26 DIAGNOSIS — D62 Acute posthemorrhagic anemia: Secondary | ICD-10-CM

## 2013-07-26 LAB — BASIC METABOLIC PANEL
CO2: 23 mEq/L (ref 19–32)
Calcium: 8.9 mg/dL (ref 8.4–10.5)
GFR calc Af Amer: >90 mL/min (ref >90)
GFR calc non Af Amer: 89 mL/min — ABNORMAL LOW (ref >90)
Glucose, Bld: 195 mg/dL — ABNORMAL HIGH (ref 70–99)
Potassium: 4.5 mEq/L (ref 3.5–5.1)
Sodium: 133 mEq/L — ABNORMAL LOW (ref 135–145)

## 2013-07-26 LAB — CBC
Hemoglobin: 10.9 g/dL — ABNORMAL LOW (ref 12.0–15.0)
MCH: 30.3 pg (ref 26.0–34.0)
Platelets: 211 10*3/uL (ref 150–400)
RBC: 3.6 MIL/uL — ABNORMAL LOW (ref 3.87–5.11)
RDW: 13.3 % (ref 11.5–15.5)
WBC: 12 10*3/uL — ABNORMAL HIGH (ref 4.0–10.5)

## 2013-07-26 MED ORDER — CYCLOBENZAPRINE HCL 10 MG PO TABS
5.0000 mg | ORAL_TABLET | Freq: Three times a day (TID) | ORAL | Status: DC | PRN
  Administered 2013-07-26 – 2013-07-27 (×4): 5 mg via ORAL
  Filled 2013-07-26 (×4): qty 1

## 2013-07-26 NOTE — Progress Notes (Signed)
OT Cancellation Note  Patient Details Name: Tammy [name] MRN: [id number] DOB: 10-11-1940   Cancelled Treatment:    Reason Eval/Treat Not Completed: Other (comment).  Pt just took pain meds and wants to rest per nursing.  Will check back tomorrow.    Tammy [name] 07/26/2013, 2:30 PM Marica Otter, OTR/L [phone] 07/26/2013

## 2013-07-26 NOTE — Progress Notes (Signed)
Clinical Social Work Department BRIEF PSYCHOSOCIAL ASSESSMENT 07/26/2013  Patient:  Tammy Tammy [name], Tammy Tammy [name]     Account Number:  [id number]     Admit date:  07/25/2013  Clinical Social Worker:  Candie Chroman  Date/Time:  07/26/2013 02:48 PM  Referred by:  Physician  Date Referred:  07/26/2013 Referred for  SNF Placement   Other Referral:   Interview type:  Patient Other interview type:    PSYCHOSOCIAL DATA Living Status:  ALONE Admitted from facility:   Level of care:   Primary support name:  Willey Blade Primary support relationship to patient:  CHILD, ADULT Degree of support available:   unclear    CURRENT CONCERNS Current Concerns  Post-Acute Placement   Other Concerns:    SOCIAL WORK ASSESSMENT / PLAN Pt is Tammy [name] 72 yr old female living at home prior to hospitalization. CSW met with pt to assist with d/c planning needs. Pt has made prior arrangements to have ST Rehab at [facility] following hospital d/c. CSW has contacted SNF and d/c plan has been confirmed. CSW will continue to follow to assist with d/c planning to SNF.   Assessment/plan status:  Psychosocial Support/Ongoing Assessment of Needs Other assessment/ plan:   Information/referral to community resources:   Insurance coverage for SNF/ambulance transport  reviewed.    PATIENT'S/FAMILY'S RESPONSE TO PLAN OF CARE: Pt is looking forward to having rehab at [facility].   Cori Razor LCSW [phone]

## 2013-07-26 NOTE — Progress Notes (Signed)
   Subjective: 1 Day Post-Op Procedure(s) (LRB): RIGHT TOTAL KNEE ARTHROPLASTY (Right) Patient reports pain as mild and moderate.   Patient seen in rounds with Dr. Lequita Halt.  Asking about changing over to Flexeril muscle relaxant. Patient is well, but has had some minor complaints of pain in the knee, requiring pain medications We will start therapy today.  Plan is to go [facility] after hospital stay.  Objective: Vital signs in last 24 hours: Temp:  [96.8 F (36 C)-98 F (36.7 C)] 97.8 F (36.6 C) (12/02 0505) Pulse Rate:  [63-81] 74 (12/02 0505) Resp:  [16-18] 16 (12/02 0505) BP: (123-163)/(67-91) 127/68 mmHg (12/02 0505) SpO2:  [96 %-100 %] 97 % (12/02 0505) Weight:  [73.936 kg (163 lb)] 73.936 kg (163 lb) (12/01 1623)  Intake/Output from previous day:  Intake/Output Summary (Last 24 hours) at 07/26/13 0908 Last data filed at 07/26/13 0553  Gross per 24 hour  Intake 2837.36 ml  Output   2735 ml  Net 102.36 ml     Labs:  Recent Labs  07/26/13 0450  HGB 10.9*    Recent Labs  07/26/13 0450  WBC 12.0*  RBC 3.60*  HCT 32.3*  PLT 211    Recent Labs  07/26/13 0450  NA 133*  K 4.5  CL 100  CO2 23  BUN 11  CREATININE 0.58  GLUCOSE 195*  CALCIUM 8.9   No results found for this basename: LABPT, INR,  in the last 72 hours  EXAM General - Patient is Alert, Appropriate and Oriented Extremity - Neurovascular intact Sensation intact distally Dressing - dressing C/D/I Motor Function - intact, moving foot and toes well on exam.  Hemovac pulled without difficulty.  Past Medical History  Diagnosis Date  . Chicken pox   . Varicose vein   . Depression     mild, never on medication  . GERD (gastroesophageal reflux disease)   . Heart murmur     told in 2012 benign  . Hyperglycemia     borderline  . Anxiety   . Arthritis     knees and back  . High cholesterol     borderline, no meds  . Hypertension     high blood pressure readings, no meds  . DDD  (degenerative disc disease), lumbar   . Scoliosis   . Osteoarthritis   . Hemorrhoids     Assessment/Plan: 1 Day Post-Op Procedure(s) (LRB): RIGHT TOTAL KNEE ARTHROPLASTY (Right) Principal Problem:   OA (osteoarthritis) of knee Active Problems:   Essential hypertension, benign   GERD (gastroesophageal reflux disease)   Hyperlipidemia, mild   Postoperative anemia due to acute blood loss   Hyponatremia  Estimated body mass index is 31.83 kg/(m^2) as calculated from the following:   Height as of this encounter: 5' (1.524 m).   Weight as of this encounter: 73.936 kg (163 lb). Advance diet Up with therapy Discharge to SNF [facility] when ready.  DVT Prophylaxis - Xarelto Weight-Bearing as tolerated to right leg D/C O2 and Pulse OX and try on Room Air  Letitia Sabala 07/26/2013, 9:08 AM

## 2013-07-26 NOTE — Progress Notes (Signed)
Clinical Social Work Department CLINICAL SOCIAL WORK PLACEMENT NOTE 07/26/2013  Patient:  Tammy [name], Tammy [name]  Account Number:  [id number] Admit date:  07/25/2013  Clinical Social Worker:  Cori Razor, LCSW  Date/time:  07/26/2013 03:12 PM  Clinical Social Work is seeking post-discharge placement for this patient at the following level of care:   SKILLED NURSING   (*CSW will update this form in Epic as items are completed)     Patient/family provided with [facility] Department of Clinical Social Work's list of facilities offering this level of care within the geographic area requested by the patient (or if unable, by the patient's family).  07/26/2013  Patient/family informed of their freedom to choose among providers that offer the needed level of care, that participate in Medicare, Medicaid or managed care program needed by the patient, have an available bed and are willing to accept the patient.    Patient/family informed of [facility]' ownership interest in [facility], as well as of the fact that they are under no obligation to receive care at this facility.  PASARR submitted to EDS on 07/26/2013 PASARR number received from EDS on   FL2 transmitted to all facilities in geographic area requested by pt/family on  07/25/2013 FL2 transmitted to all facilities within larger geographic area on 07/25/2013  Patient informed that his/her managed care company has contracts with or will negotiate with  certain facilities, including the following:     Patient/family informed of bed offers received:  07/26/2013 Patient chooses bed at [facility] Physician recommends and patient chooses bed at    Patient to be transferred to [facility] on   Patient to be transferred to facility by   The following physician request were entered in Epic:   Additional Comments:

## 2013-07-26 NOTE — Progress Notes (Signed)
Physical Therapy Treatment Patient Details Name: Tammy [name] MRN: [id number] DOB: Sep 28, 1940 Today's Date: 07/26/2013 Time: 7829-5621 PT Time Calculation (min): 12 min  PT Assessment / Plan / Recommendation  History of Present Illness R TKA   PT Comments   *Pt reports she's too fatigued to walk again. Agreed to bed exercises. Performed R TKA exercises with min A.**  Follow Up Recommendations  SNF     Does the patient have the potential to tolerate intense rehabilitation     Barriers to Discharge        Equipment Recommendations  Rolling walker with 5" wheels    Recommendations for Other Services    Frequency     Progress towards PT Goals    Plan      Precautions / Restrictions Precautions Precautions: Knee Required Braces or Orthoses: Knee Immobilizer - Right Knee Immobilizer - Right: Discontinue once straight leg raise with < 10 degree lag Restrictions Other Position/Activity Restrictions: WBAT   Pertinent Vitals/Pain *5/10 R knee Premedicated, ice applied**    Mobility  Bed Mobility Bed Mobility: Supine to Sit Supine to Sit: 4: Min assist;HOB elevated Details for Bed Mobility Assistance: min A for RLE Transfers Transfers: Sit to Stand;Stand to Sit Sit to Stand: 4: Min assist;From bed Stand to Sit: 5: Supervision;To bed Details for Transfer Assistance: VCs hand placement, assist to rise/steady Ambulation/Gait Ambulation/Gait Assistance: 4: Min guard Ambulation Distance (Feet): 120 Feet Assistive device: Rolling walker Gait Pattern: Step-through pattern Gait velocity: decr General Gait Details: steady, no LOB, min/guard for safety    Exercises Total Joint Exercises Ankle Circles/Pumps: AROM;Both;Supine;15 reps Quad Sets: AROM;Both;Supine;15 reps Towel Squeeze: AROM;Both;15 reps;Supine Short Arc Quad: AAROM;Right;15 reps Heel Slides: AAROM;Right;10 reps;Supine Hip ABduction/ADduction: AAROM;Right;15 reps;Supine Straight Leg Raises: AAROM;Right;10  reps;Supine Goniometric ROM: 40* flexion, -10 extension R knee   PT Diagnosis: Acute pain;Difficulty walking  PT Problem List: Decreased range of motion;Decreased strength;Decreased activity tolerance;Decreased mobility PT Treatment Interventions:     PT Goals (current goals can now be found in the care plan section) Acute Rehab PT Goals Patient Stated Goal: return to independence PT Goal Formulation: With patient Time For Goal Achievement: 08/09/13 Potential to Achieve Goals: Good  Visit Information  Last PT Received On: 07/26/13 Assistance Needed: +1 History of Present Illness: R TKA    Subjective Data  Patient Stated Goal: return to independence   Cognition  Cognition Arousal/Alertness: Awake/alert Behavior During Therapy: WFL for tasks assessed/performed Overall Cognitive Status: Within Functional Limits for tasks assessed    Balance     End of Session PT - End of Session Equipment Utilized During Treatment: Gait belt;Right knee immobilizer Activity Tolerance: Patient tolerated treatment well Patient left: in bed;with call bell/phone within reach   GP     Tammy [name] 07/26/2013, 1:11 PM [phone]

## 2013-07-26 NOTE — Evaluation (Signed)
Physical Therapy Evaluation Patient Details Name: Tammy [name] MRN: [id number] DOB: [birth year] Today's Date: 07/26/2013 Time: 4098-1191 PT Time Calculation (min): 22 min  PT Assessment / Plan / Recommendation History of Present Illness  R TKA  Clinical Impression  *Pt is s/p TKA resulting in the deficits listed below (see PT Problem List). * Pt will benefit from skilled PT to increase their independence and safety with mobility to allow discharge to the venue listed below. **    PT Assessment  Patient needs continued PT services    Follow Up Recommendations  SNF    Does the patient have the potential to tolerate intense rehabilitation      Barriers to Discharge        Equipment Recommendations  Rolling walker with 5" wheels    Recommendations for Other Services     Frequency      Precautions / Restrictions Precautions Precautions: Knee Required Braces or Orthoses: Knee Immobilizer - Right Knee Immobilizer - Right: Discontinue once straight leg raise with < 10 degree lag Restrictions Other Position/Activity Restrictions: WBAT   Pertinent Vitals/Pain **5/10 R knee Premedicated Ice applied*      Mobility  Bed Mobility Bed Mobility: Supine to Sit Supine to Sit: 4: Min assist;HOB elevated Details for Bed Mobility Assistance: min A for RLE Transfers Transfers: Sit to Stand;Stand to Sit Sit to Stand: 4: Min assist;From bed Stand to Sit: 5: Supervision;To bed Details for Transfer Assistance: VCs hand placement, assist to rise/steady Ambulation/Gait Ambulation/Gait Assistance: 4: Min guard Ambulation Distance (Feet): 120 Feet Assistive device: Rolling walker Gait Pattern: Step-through pattern Gait velocity: decr General Gait Details: steady, no LOB, min/guard for safety    Exercises Total Joint Exercises Ankle Circles/Pumps: AROM;10 reps;Both;Supine Quad Sets: AROM;Both;10 reps;Supine Heel Slides: AAROM;Right;10 reps;Supine Goniometric ROM: 40* flexion  R knee   PT Diagnosis: Acute pain;Difficulty walking  PT Problem List: Decreased range of motion;Decreased strength;Decreased activity tolerance;Decreased mobility PT Treatment Interventions:       PT Goals(Current goals can be found in the care plan section) Acute Rehab PT Goals Patient Stated Goal: return to independence PT Goal Formulation: With patient Time For Goal Achievement: 08/09/13 Potential to Achieve Goals: Good  Visit Information  Last PT Received On: 07/26/13 Assistance Needed: +1 History of Present Illness: R TKA       Prior Functioning  Home Living Family/patient expects to be discharged to:: Skilled nursing facility Living Arrangements: Alone Prior Function Level of Independence: Independent Communication Communication: No difficulties    Cognition  Cognition Arousal/Alertness: Awake/alert Behavior During Therapy: WFL for tasks assessed/performed Overall Cognitive Status: Within Functional Limits for tasks assessed    Extremity/Trunk Assessment Upper Extremity Assessment Upper Extremity Assessment: Overall WFL for tasks assessed Lower Extremity Assessment Lower Extremity Assessment: RLE deficits/detail RLE Deficits / Details: knee flexion AAROM 40* Cervical / Trunk Assessment Cervical / Trunk Assessment: Normal   Balance    End of Session PT - End of Session Equipment Utilized During Treatment: Gait belt Activity Tolerance: Patient tolerated treatment well Patient left: in bed;with call bell/phone within reach  GP     [name] 07/26/2013, 12:30 PM [phone]

## 2013-07-27 ENCOUNTER — Non-Acute Institutional Stay (SKILLED_NURSING_FACILITY): Payer: Medicare Other | Admitting: Adult Health

## 2013-07-27 DIAGNOSIS — I10 Essential (primary) hypertension: Secondary | ICD-10-CM

## 2013-07-27 DIAGNOSIS — IMO0002 Reserved for concepts with insufficient information to code with codable children: Secondary | ICD-10-CM

## 2013-07-27 DIAGNOSIS — M179 Osteoarthritis of knee, unspecified: Secondary | ICD-10-CM

## 2013-07-27 DIAGNOSIS — K5900 Constipation, unspecified: Secondary | ICD-10-CM

## 2013-07-27 DIAGNOSIS — K219 Gastro-esophageal reflux disease without esophagitis: Secondary | ICD-10-CM

## 2013-07-27 DIAGNOSIS — M1710 Unilateral primary osteoarthritis, unspecified knee: Secondary | ICD-10-CM

## 2013-07-27 LAB — BASIC METABOLIC PANEL
BUN: 11 mg/dL (ref 6–23)
CO2: 25 mEq/L (ref 19–32)
Calcium: 9.3 mg/dL (ref 8.4–10.5)
Creatinine, Ser: 0.68 mg/dL (ref 0.50–1.10)
GFR calc Af Amer: >90 mL/min (ref >90)
GFR calc non Af Amer: 85 mL/min — ABNORMAL LOW (ref >90)
Glucose, Bld: 173 mg/dL — ABNORMAL HIGH (ref 70–99)
Potassium: 3.6 mEq/L (ref 3.5–5.1)
Sodium: 136 mEq/L (ref 135–145)

## 2013-07-27 LAB — CBC
HCT: 31.5 % — ABNORMAL LOW (ref 36.0–46.0)
Hemoglobin: 10.7 g/dL — ABNORMAL LOW (ref 12.0–15.0)
MCH: 30.9 pg (ref 26.0–34.0)
MCHC: 34 g/dL (ref 30.0–36.0)
MCV: 91 fL (ref 78.0–100.0)
RDW: 13.9 % (ref 11.5–15.5)

## 2013-07-27 MED ORDER — OXYCODONE HCL 5 MG PO TABS: 5.0000 mg | ORAL_TABLET | ORAL | Status: DC | PRN

## 2013-07-27 MED ORDER — ONDANSETRON HCL 4 MG PO TABS: 4.0000 mg | ORAL_TABLET | Freq: Four times a day (QID) | ORAL | Status: DC | PRN

## 2013-07-27 MED ORDER — CYCLOBENZAPRINE HCL 5 MG PO TABS: 5.0000 mg | ORAL_TABLET | Freq: Three times a day (TID) | ORAL | Status: DC | PRN

## 2013-07-27 MED ORDER — TRAMADOL HCL 50 MG PO TABS: 50.0000 mg | ORAL_TABLET | Freq: Three times a day (TID) | ORAL | Status: DC | PRN

## 2013-07-27 MED ORDER — RIVAROXABAN 10 MG PO TABS: 10.0000 mg | ORAL_TABLET | Freq: Every day | ORAL | Status: DC

## 2013-07-27 MED ORDER — BISACODYL 10 MG RE SUPP: 10.0000 mg | Freq: Every day | RECTAL | Status: DC | PRN

## 2013-07-27 MED ORDER — POLYETHYLENE GLYCOL 3350 17 G PO PACK: 17.0000 g | PACK | Freq: Every day | ORAL | Status: DC | PRN

## 2013-07-27 MED ORDER — DSS 100 MG PO CAPS: 100.0000 mg | ORAL_CAPSULE | Freq: Two times a day (BID) | ORAL | Status: DC

## 2013-07-27 MED ORDER — METOCLOPRAMIDE HCL 5 MG PO TABS: 5.0000 mg | ORAL_TABLET | Freq: Three times a day (TID) | ORAL | Status: DC | PRN

## 2013-07-27 MED ORDER — HYDROCORTISONE 2.5 % RE CREA: TOPICAL_CREAM | Freq: Two times a day (BID) | RECTAL | Status: DC

## 2013-07-27 MED ORDER — HYDROCORTISONE 2.5 % RE CREA
TOPICAL_CREAM | Freq: Two times a day (BID) | RECTAL | Status: DC
  Administered 2013-07-27: 1 via RECTAL
  Filled 2013-07-27: qty 28.35

## 2013-07-27 MED ORDER — ACETAMINOPHEN 325 MG PO TABS: 650.0000 mg | ORAL_TABLET | Freq: Four times a day (QID) | ORAL | Status: DC | PRN

## 2013-07-27 NOTE — Progress Notes (Signed)
   Subjective: 2 Days Post-Op Procedure(s) (LRB): RIGHT TOTAL KNEE ARTHROPLASTY (Right) Patient reports pain as mild.   Patient seen in rounds by Dr. Lequita Halt. Patient is well, and has had no acute complaints or problems Patient is ready to go to the SNF today.  Objective: Vital signs in last 24 hours: Temp:  [98.1 F (36.7 C)-98.2 F (36.8 C)] 98.2 F (36.8 C) (12/03 0639) Pulse Rate:  [69-84] 78 (12/03 0639) Resp:  [14-16] 16 (12/03 0639) BP: (114-160)/(65-80) 160/80 mmHg (12/03 0639) SpO2:  [96 %-100 %] 99 % (12/03 0639)  Intake/Output from previous day:  Intake/Output Summary (Last 24 hours) at 07/27/13 0730 Last data filed at 07/26/13 2229  Gross per 24 hour  Intake 1742.08 ml  Output   1550 ml  Net 192.08 ml    Intake/Output this shift:    Labs:  Recent Labs  07/26/13 0450 07/27/13 0515  HGB 10.9* 10.7*    Recent Labs  07/26/13 0450 07/27/13 0515  WBC 12.0* 10.2  RBC 3.60* 3.46*  HCT 32.3* 31.5*  PLT 211 212    Recent Labs  07/26/13 0450 07/27/13 0515  NA 133* 136  K 4.5 3.6  CL 100 100  CO2 23 25  BUN 11 11  CREATININE 0.58 0.68  GLUCOSE 195* 173*  CALCIUM 8.9 9.3   No results found for this basename: LABPT, INR,  in the last 72 hours  EXAM: General - Patient is Alert, Appropriate and Oriented Extremity - Neurovascular intact Sensation intact distally Dorsiflexion/Plantar flexion intact Incision - clean, dry, no drainage, healing Motor Function - intact, moving foot and toes well on exam.   Assessment/Plan: 2 Days Post-Op Procedure(s) (LRB): RIGHT TOTAL KNEE ARTHROPLASTY (Right) Procedure(s) (LRB): RIGHT TOTAL KNEE ARTHROPLASTY (Right) Past Medical History  Diagnosis Date  . Chicken pox   . Varicose vein   . Depression     mild, never on medication  . GERD (gastroesophageal reflux disease)   . Heart murmur     told in 2012 benign  . Hyperglycemia     borderline  . Anxiety   . Arthritis     knees and back  . High  cholesterol     borderline, no meds  . Hypertension     high blood pressure readings, no meds  . DDD (degenerative disc disease), lumbar   . Scoliosis   . Osteoarthritis   . Hemorrhoids    Principal Problem:   OA (osteoarthritis) of knee Active Problems:   Essential hypertension, benign   GERD (gastroesophageal reflux disease)   Hyperlipidemia, mild   Postoperative anemia due to acute blood loss   Hyponatremia  Estimated body mass index is 31.83 kg/(m^2) as calculated from the following:   Height as of this encounter: 5' (1.524 m).   Weight as of this encounter: 73.936 kg (163 lb). Up with therapy Discharge to SNF [facility] Diet - Regular diet Follow up - in 2 weeks Activity - WBAT Disposition - Skilled nursing facility Condition Upon Discharge - Good D/C Meds - See DC Summary DVT Prophylaxis - Xarelto  [name], Tammy 07/27/2013, 7:30 AM

## 2013-07-27 NOTE — Progress Notes (Signed)
Clinical Social Work Department CLINICAL SOCIAL WORK PLACEMENT NOTE 07/27/2013  Patient:  Tammy [name], Tammy [name] A  Account Number:  [id number] Admit date:  07/25/2013  Clinical Social Worker:  Cori Razor, LCSW  Date/time:  07/26/2013 03:12 PM  Clinical Social Work is seeking post-discharge placement for this patient at the following level of care:   SKILLED NURSING   (*CSW will update this form in Epic as items are completed)     Patient/family provided with [facility] Department of Clinical Social Work's list of facilities offering this level of care within the geographic area requested by the patient (or if unable, by the patient's family).  07/26/2013  Patient/family informed of their freedom to choose among providers that offer the needed level of care, that participate in Medicare, Medicaid or managed care program needed by the patient, have an available bed and are willing to accept the patient.    Patient/family informed of [facility]' ownership interest in [facility], as well as of the fact that they are under no obligation to receive care at this facility.  PASARR submitted to EDS on 07/26/2013 PASARR number received from EDS on   FL2 transmitted to all facilities in geographic area requested by pt/family on  07/25/2013 FL2 transmitted to all facilities within larger geographic area on 07/25/2013  Patient informed that his/her managed care company has contracts with or will negotiate with  certain facilities, including the following:     Patient/family informed of bed offers received:  07/26/2013 Patient chooses bed at [facility] Physician recommends and patient chooses bed at    Patient to be transferred to [facility] on  07/27/2013 Patient to be transferred to facility by FAMILY  The following physician request were entered in Epic:   Additional Comments:  Cori Razor LCSW [phone]

## 2013-07-27 NOTE — Evaluation (Signed)
Occupational Therapy Evaluation Patient Details Name: Tammy [name] MRN: [id number] DOB: 02-16-1941 Today's Date: 07/27/2013 Time: 8413-2440 OT Time Calculation (min): 25 min  OT Assessment / Plan / Recommendation History of present illness R TKA   Clinical Impression   Pt doing well with ADL and planning SNF. Supposed to d/c today.    OT Assessment  All further OT needs can be met in the next venue of care    Follow Up Recommendations  SNF;Supervision/Assistance - 24 hour    Barriers to Discharge      Equipment Recommendations   (TBD next venue)    Recommendations for Other Services    Frequency       Precautions / Restrictions Precautions Precautions: Knee Required Braces or Orthoses: Knee Immobilizer - Right Knee Immobilizer - Right: Discontinue once straight leg raise with < 10 degree lag Restrictions Weight Bearing Restrictions: No Other Position/Activity Restrictions: WBAT   Pertinent Vitals/Pain 7/10 R knee; reposition, ice    ADL  Eating/Feeding: Independent Where Assessed - Eating/Feeding: Chair Grooming: Wash/dry hands;Min guard Where Assessed - Grooming: Unsupported standing Upper Body Bathing: Chest;Right arm;Left arm;Abdomen;Set up Where Assessed - Upper Body Bathing: Unsupported sitting Lower Body Bathing: Minimal assistance (for R foot for thoroughness) Where Assessed - Lower Body Bathing: Supported sit to stand Upper Body Dressing: Set up Where Assessed - Upper Body Dressing: Unsupported sitting Lower Body Dressing: Minimal assistance Where Assessed - Lower Body Dressing: Supported sit to stand Toilet Transfer: Lobbyist: Raised toilet seat with arms (or 3-in-1 over toilet) Toileting - Clothing Manipulation and Hygiene: Min guard Where Assessed - Toileting Clothing Manipulation and Hygiene: Sit to stand from 3-in-1 or toilet Equipment Used: Knee Immobilizer;Rolling walker ADL Comments: Pt did well with up to  toilet with walker and then over to sink to wash hands in standing. Pt planning SNF and supposed to d/c today.    OT Diagnosis: Generalized weakness  OT Problem List: Decreased strength;Decreased knowledge of use of DME or AE OT Treatment Interventions:     OT Goals(Current goals can be found in the care plan section) Acute Rehab OT Goals Patient Stated Goal: return to independence  Visit Information  Last OT Received On: 07/27/13 Assistance Needed: +1 History of Present Illness: R TKA       Prior Functioning     Home Living Family/patient expects to be discharged to:: Skilled nursing facility Living Arrangements: Alone Prior Function Level of Independence: Independent Communication Communication: No difficulties         Vision/Perception     Cognition  Cognition Arousal/Alertness: Awake/alert Behavior During Therapy: WFL for tasks assessed/performed Overall Cognitive Status: Within Functional Limits for tasks assessed    Extremity/Trunk Assessment Upper Extremity Assessment Upper Extremity Assessment: Overall WFL for tasks assessed     Mobility Bed Mobility Bed Mobility: Supine to Sit Supine to Sit: 5: Supervision Transfers Transfers: Sit to Stand;Stand to Sit Sit to Stand: 5: Supervision;With upper extremity assist;From bed;From chair/3-in-1 Stand to Sit: 5: Supervision;With upper extremity assist;To chair/3-in-1 Details for Transfer Assistance: supervision for safety     Exercise     Balance Balance Balance Assessed: Yes Dynamic Standing Balance Dynamic Standing - Level of Assistance: 5: Stand by assistance   End of Session OT - End of Session Equipment Utilized During Treatment: Right knee immobilizer Activity Tolerance: Patient tolerated treatment well Patient left: in chair;with call bell/phone within reach CPM Right Knee CPM Right Knee: Off  GO     Judithann Sauger  [address] [phone] 07/27/2013, 10:14 AM

## 2013-07-27 NOTE — Progress Notes (Signed)
Pt d/c to [facility]. Report given to Selena Batten, Charity fundraiser. Pt premedicated for pain. Ready for d/c.

## 2013-07-27 NOTE — Care Management Note (Signed)
    Page 1 of 1   07/27/2013     10:45:33 AM   CARE MANAGEMENT NOTE 07/27/2013  Patient:  [facility]   Account Number:  [id number]  Date Initiated:  07/27/2013  Documentation initiated by:  Colleen Can  Subjective/Objective Assessment:   dx rt total knee replacemnt     Action/Plan:   SNF rehab   Anticipated DC Date:  07/27/2013   Anticipated DC Plan:  SKILLED NURSING FACILITY  In-house referral  Clinical Social Worker      DC Planning Services  CM consult      Choice offered to / List presented to:             Status of service:  Completed, signed off Medicare Important Message given?  NA - LOS <3 / Initial given by admissions (If response is "NO", the following Medicare IM given date fields will be blank) Date Medicare IM given:   Date Additional Medicare IM given:    Discharge Disposition:    Per UR Regulation:    If discussed at Long Length of Stay Meetings, dates discussed:    Comments:

## 2013-07-27 NOTE — Discharge Summary (Signed)
Physician Discharge Summary   Patient ID: Tammy [name] MRN: [id number] DOB/AGE: [birth year] 73 y.o.  Admit date: 07/25/2013 Discharge date: 07/27/2013  Primary Diagnosis:  Osteoarthritis Bilateral knee(s)  Admission Diagnoses:  Past Medical History  Diagnosis Date  . Chicken pox   . Varicose vein   . Depression     mild, never on medication  . GERD (gastroesophageal reflux disease)   . Heart murmur     told in 2012 benign  . Hyperglycemia     borderline  . Anxiety   . Arthritis     knees and back  . High cholesterol     borderline, no meds  . Hypertension     high blood pressure readings, no meds  . DDD (degenerative disc disease), lumbar   . Scoliosis   . Osteoarthritis   . Hemorrhoids    Discharge Diagnoses:   Principal Problem:   OA (osteoarthritis) of knee Active Problems:   Essential hypertension, benign   GERD (gastroesophageal reflux disease)   Hyperlipidemia, mild   Postoperative anemia due to acute blood loss   Hyponatremia  Estimated body mass index is 31.83 kg/(m^2) as calculated from the following:   Height as of this encounter: 5' (1.524 m).   Weight as of this encounter: 73.936 kg (163 lb).  Procedure:  Procedure(s) (LRB): RIGHT TOTAL KNEE ARTHROPLASTY (Right)   Consults: None  HPI: Tammy [name] is a 72 y.o. year old female with end stage OA of her right knee with progressively worsening pain and dysfunction. She has constant pain, with activity and at rest and significant functional deficits with difficulties even with ADLs. She has had extensive non-op management including analgesics, injections of cortisone, and home exercise program, but remains in significant pain with significant dysfunction.Radiographs show bone on bone arthritis medial and patellofemoral with large varus deformity. She presents now for right Total Knee Arthroplasty.   Laboratory Data: Admission on 07/25/2013  Component Date Value Range Status  . ABO/RH(D)  07/25/2013 A POS   Final  . Antibody Screen 07/25/2013 NEG   Final  . Sample Expiration 07/25/2013 07/28/2013   Final  . ABO/RH(D) 07/25/2013 A POS   Final  . WBC 07/26/2013 12.0* 4.0 - 10.5 K/uL Final  . RBC 07/26/2013 3.60* 3.87 - 5.11 MIL/uL Final  . Hemoglobin 07/26/2013 10.9* 12.0 - 15.0 g/dL Final  . HCT 78/46/9629 32.3* 36.0 - 46.0 % Final  . MCV 07/26/2013 89.7  78.0 - 100.0 fL Final  . MCH 07/26/2013 30.3  26.0 - 34.0 pg Final  . MCHC 07/26/2013 33.7  30.0 - 36.0 g/dL Final  . RDW 52/84/1324 13.3  11.5 - 15.5 % Final  . Platelets 07/26/2013 211  150 - 400 K/uL Final  . Sodium 07/26/2013 133* 135 - 145 mEq/L Final  . Potassium 07/26/2013 4.5  3.5 - 5.1 mEq/L Final  . Chloride 07/26/2013 100  96 - 112 mEq/L Final  . CO2 07/26/2013 23  19 - 32 mEq/L Final  . Glucose, Bld 07/26/2013 195* 70 - 99 mg/dL Final  . BUN 40/05/2724 11  6 - 23 mg/dL Final  . Creatinine, Ser 07/26/2013 0.58  0.50 - 1.10 mg/dL Final  . Calcium 36/64/4034 8.9  8.4 - 10.5 mg/dL Final  . GFR calc non Af Amer 07/26/2013 89* >90 mL/min Final  . GFR calc Af Amer 07/26/2013 >90  >90 mL/min Final   Comment: (NOTE)  The eGFR has been calculated using the CKD EPI equation.                          This calculation has not been validated in all clinical situations.                          eGFR's persistently <90 mL/min signify possible Chronic Kidney                          Disease.  . WBC 07/27/2013 10.2  4.0 - 10.5 K/uL Final  . RBC 07/27/2013 3.46* 3.87 - 5.11 MIL/uL Final  . Hemoglobin 07/27/2013 10.7* 12.0 - 15.0 g/dL Final  . HCT 16/05/9603 31.5* 36.0 - 46.0 % Final  . MCV 07/27/2013 91.0  78.0 - 100.0 fL Final  . MCH 07/27/2013 30.9  26.0 - 34.0 pg Final  . MCHC 07/27/2013 34.0  30.0 - 36.0 g/dL Final  . RDW 54/04/8118 13.9  11.5 - 15.5 % Final  . Platelets 07/27/2013 212  150 - 400 K/uL Final  . Sodium 07/27/2013 136  135 - 145 mEq/L Final  . Potassium 07/27/2013 3.6  3.5 - 5.1  mEq/L Final   Comment: REPEATED TO VERIFY                          DELTA CHECK NOTED                          NO VISIBLE HEMOLYSIS  . Chloride 07/27/2013 100  96 - 112 mEq/L Final  . CO2 07/27/2013 25  19 - 32 mEq/L Final  . Glucose, Bld 07/27/2013 173* 70 - 99 mg/dL Final  . BUN 14/78/2956 11  6 - 23 mg/dL Final  . Creatinine, Ser 07/27/2013 0.68  0.50 - 1.10 mg/dL Final  . Calcium 21/30/8657 9.3  8.4 - 10.5 mg/dL Final  . GFR calc non Af Amer 07/27/2013 85* >90 mL/min Final  . GFR calc Af Amer 07/27/2013 >90  >90 mL/min Final   Comment: (NOTE)                          The eGFR has been calculated using the CKD EPI equation.                          This calculation has not been validated in all clinical situations.                          eGFR's persistently <90 mL/min signify possible Chronic Kidney                          Disease.  Hospital Outpatient Visit on 07/13/2013  Component Date Value Range Status  . WBC 07/13/2013 5.2  4.0 - 10.5 K/uL Final  . RBC 07/13/2013 4.58  3.87 - 5.11 MIL/uL Final  . Hemoglobin 07/13/2013 14.1  12.0 - 15.0 g/dL Final  . HCT 84/69/6295 41.2  36.0 - 46.0 % Final  . MCV 07/13/2013 90.0  78.0 - 100.0 fL Final  . MCH 07/13/2013 30.8  26.0 - 34.0 pg Final  . MCHC 07/13/2013 34.2  30.0 - 36.0 g/dL Final  .  RDW 07/13/2013 13.7  11.5 - 15.5 % Final  . Platelets 07/13/2013 197  150 - 400 K/uL Final  . Sodium 07/13/2013 137  135 - 145 mEq/L Final  . Potassium 07/13/2013 4.5  3.5 - 5.1 mEq/L Final  . Chloride 07/13/2013 102  96 - 112 mEq/L Final  . CO2 07/13/2013 26  19 - 32 mEq/L Final  . Glucose, Bld 07/13/2013 96  70 - 99 mg/dL Final  . BUN 78/29/5621 13  6 - 23 mg/dL Final  . Creatinine, Ser 07/13/2013 0.79  0.50 - 1.10 mg/dL Final  . Calcium 30/86/5784 9.7  8.4 - 10.5 mg/dL Final  . GFR calc non Af Amer 07/13/2013 81* >90 mL/min Final  . GFR calc Af Amer 07/13/2013 >90  >90 mL/min Final   Comment: (NOTE)                          The eGFR has  been calculated using the CKD EPI equation.                          This calculation has not been validated in all clinical situations.                          eGFR's persistently <90 mL/min signify possible Chronic Kidney                          Disease.  Marland Kitchen Prothrombin Time 07/13/2013 12.6  11.6 - 15.2 seconds Final  . INR 07/13/2013 0.96  0.00 - 1.49 Final  . aPTT 07/13/2013 30  24 - 37 seconds Final  . MRSA, PCR 07/13/2013 NEGATIVE  NEGATIVE Final  . Staphylococcus aureus 07/13/2013 POSITIVE* NEGATIVE Final   Comment:                                 The Xpert SA Assay (FDA                          approved for NASAL specimens                          in patients over 80 years of age),                          is one component of                          a comprehensive surveillance                          program.  Test performance has                          been validated by [employer] for patients greater                          than or equal to 20 year old.  It is not intended                          to diagnose infection nor to                          guide or monitor treatment.  . Color, Urine 07/13/2013 AMBER* YELLOW Final   BIOCHEMICALS MAY BE AFFECTED BY COLOR  . APPearance 07/13/2013 CLEAR  CLEAR Final  . Specific Gravity, Urine 07/13/2013 1.033* 1.005 - 1.030 Final  . pH 07/13/2013 6.0  5.0 - 8.0 Final  . Glucose, UA 07/13/2013 NEGATIVE  NEGATIVE mg/dL Final  . Hgb urine dipstick 07/13/2013 NEGATIVE  NEGATIVE Final  . Bilirubin Urine 07/13/2013 LARGE* NEGATIVE Final  . Ketones, ur 07/13/2013 15* NEGATIVE mg/dL Final  . Protein, ur 81/19/1478 NEGATIVE  NEGATIVE mg/dL Final  . Urobilinogen, UA 07/13/2013 1.0  0.0 - 1.0 mg/dL Final  . Nitrite 29/56/2130 NEGATIVE  NEGATIVE Final  . Leukocytes, UA 07/13/2013 SMALL* NEGATIVE Final  . Squamous Epithelial / LPF 07/13/2013 RARE  RARE Final  . WBC, UA 07/13/2013 0-2  <3  WBC/hpf Final  . Casts 07/13/2013 HYALINE CASTS* NEGATIVE Final  . Crystals 07/13/2013 CA OXALATE CRYSTALS* NEGATIVE Final  . Urine-Other 07/13/2013 MUCOUS PRESENT   Final     X-Rays:No results found.  EKG: Orders placed during the hospital encounter of 04/22/13  . EKG     Hospital Course: JACALYNN BUZZELL is a 72 y.o. who was admitted to [facility]. They were brought to the operating room on 07/25/2013 and underwent Procedure(s): RIGHT TOTAL KNEE ARTHROPLASTY.  Patient tolerated the procedure well and was later transferred to the recovery room and then to the orthopaedic floor for postoperative care.  They were given PO and IV analgesics for pain control following their surgery.  They were given 24 hours of postoperative antibiotics of  Anti-infectives   Start     Dose/Rate Route Frequency Ordered Stop   07/25/13 2300  vancomycin (VANCOCIN) IVPB 1000 mg/200 mL premix     1,000 mg 200 mL/hr over 60 Minutes Intravenous Every 12 hours 07/25/13 1435 07/25/13 2357   07/25/13 1130  vancomycin (VANCOCIN) IVPB 1000 mg/200 mL premix     1,000 mg 200 mL/hr over 60 Minutes Intravenous  Once 07/25/13 1124 07/25/13 1135   07/25/13 0900  ceFAZolin (ANCEF) IVPB 2 g/50 mL premix  Status:  Discontinued     2 g 100 mL/hr over 30 Minutes Intravenous On call to O.R. 07/25/13 8657 07/25/13 1431     and started on DVT prophylaxis in the form of Arixtra.   PT and OT were ordered for total joint protocol.  Discharge planning consulted to help with postop disposition and equipment needs.  Patient had a decent night on the evening of surgery.  They started to get up OOB with therapy on day one walking 120 feet. Hemovac drain was pulled without difficulty.  Continued to work with therapy into day two.  Dressing was changed on day two and the incision was healing well.  Patient was seen in rounds and was ready to go to [facility].   Discharge Medications: Prior to Admission medications     Medication Sig Start Date End Date Taking? Authorizing Provider  DOXYLAMINE SUCCINATE PO Take 0.5-1 tablets by mouth at bedtime as needed (for sleep).   Yes Historical Provider, MD  pantoprazole (PROTONIX) 40 MG tablet Take 1  tablet (40 mg total) by mouth daily. 07/15/13  Yes Terressa Koyanagi, DO  acetaminophen (TYLENOL) 325 MG tablet Take 2 tablets (650 mg total) by mouth every 6 (six) hours as needed for mild pain (or Fever >/= 101). 07/27/13   Khaila Velarde Julien Girt, PA-C  bisacodyl (DULCOLAX) 10 MG suppository Place 1 suppository (10 mg total) rectally daily as needed for moderate constipation. 07/27/13   Acelynn Dejonge, PA-C  cyclobenzaprine (FLEXERIL) 5 MG tablet Take 1 tablet (5 mg total) by mouth 3 (three) times daily as needed for muscle spasms. 07/27/13   Austynn Pridmore, PA-C  docusate sodium 100 MG CAPS Take 100 mg by mouth 2 (two) times daily. 07/27/13   Glendoris Nodarse Julien Girt, PA-C  hydrocortisone (ANUSOL-HC) 2.5 % rectal cream Place rectally 2 (two) times daily. 07/27/13   Rosselyn Martha Julien Girt, PA-C  metoCLOPramide (REGLAN) 5 MG tablet Take 1 tablet (5 mg total) by mouth every 8 (eight) hours as needed for nausea (if ondansetron (ZOFRAN) ineffective.). 07/27/13   Olvin Rohr, PA-C  ondansetron (ZOFRAN) 4 MG tablet Take 1 tablet (4 mg total) by mouth every 6 (six) hours as needed for nausea. 07/27/13   Leasa Kincannon, PA-C  oxyCODONE (OXY IR/ROXICODONE) 5 MG immediate release tablet Take 1-2 tablets (5-10 mg total) by mouth every 3 (three) hours as needed for breakthrough pain. 07/27/13   Solomon Skowronek, PA-C  polyethylene glycol (MIRALAX / GLYCOLAX) packet Take 17 g by mouth daily as needed for mild constipation. 07/27/13   Rozanna Cormany Julien Girt, PA-C  rivaroxaban (XARELTO) 10 MG TABS tablet Take 1 tablet (10 mg total) by mouth daily with breakfast. Take Xarelto for two and a half more weeks, then discontinue Xarelto. Once the patient has completed the Xarelto, they may resume the  325 mg Aspirin. 07/27/13   Noni Stonesifer, PA-C  traMADol (ULTRAM) 50 MG tablet Take 1 tablet (50 mg total) by mouth every 8 (eight) hours as needed (mild pain). 07/27/13   Allie Ousley Julien Girt, PA-C  Pericolace - one twice a day - added written RX since no on hospital formulary.  Diet: Regular diet Activity:WBAT Follow-up:in 2 weeks Disposition - Skilled nursing facility - [facility] Discharged Condition: good   Discharge Orders   Future Orders Complete By Expires   Call MD / Call 911  As directed    Comments:     If you experience chest pain or shortness of breath, CALL 911 and be transported to the hospital emergency room.  If you develope a fever above 101 F, pus (white drainage) or increased drainage or redness at the wound, or calf pain, call your surgeon's office.   Change dressing  As directed    Comments:     Change dressing daily with sterile 4 x 4 inch gauze dressing and apply TED hose. Do not submerge the incision under water.   Constipation Prevention  As directed    Comments:     Drink plenty of fluids.  Prune juice may be helpful.  You may use a stool softener, such as Colace (over the counter) 100 mg twice a day.  Use MiraLax (over the counter) for constipation as needed.   Diet general  As directed    Discharge instructions  As directed    Comments:     Pick up stool softner and laxative for home. Do not submerge incision under water. May shower. Continue to use ice for pain and swelling from surgery.  Take Xarelto for two and a half more weeks, then discontinue Xarelto. Once the  patient has completed the Xarelto, they may resume the 325 mg Aspirin.  When discharged from the skilled rehab facility, please have the facility set up the patient's Home Health Physical Therapy prior to being released.  Also provide the patient with their medications at time of release from the facility to include their pain medication, the muscle relaxants, and their blood thinner  medication.  If the patient is still at the rehab facility at time of follow up appointment, please also assist the patient in arranging follow up appointment in our office and any transportation needs.   Do not put a pillow under the knee. Place it under the heel.  As directed    Do not sit on low chairs, stoools or toilet seats, as it may be difficult to get up from low surfaces  As directed    Driving restrictions  As directed    Comments:     No driving until released by the physician.   Increase activity slowly as tolerated  As directed    Lifting restrictions  As directed    Comments:     No lifting until released by the physician.   Patient may shower  As directed    Comments:     You may shower without a dressing once there is no drainage.  Do not wash over the wound.  If drainage remains, do not shower until drainage stops.   TED hose  As directed    Comments:     Use stockings (TED hose) for 3 weeks on both leg(s).  You may remove them at night for sleeping.   Weight bearing as tolerated  As directed    Questions:     Laterality:     Extremity:         Medication List    STOP taking these medications       aspirin 325 MG tablet     CAPSAICIN & MENTHOL EX     etodolac 400 MG tablet  Commonly known as:  LODINE     MEDERMA Gel     oxyCODONE-acetaminophen 5-325 MG per tablet  Commonly known as:  PERCOCET/ROXICET     TUMS 500 MG chewable tablet  Generic drug:  calcium carbonate      TAKE these medications       acetaminophen 325 MG tablet  Commonly known as:  TYLENOL  Take 2 tablets (650 mg total) by mouth every 6 (six) hours as needed for mild pain (or Fever >/= 101).     bisacodyl 10 MG suppository  Commonly known as:  DULCOLAX  Place 1 suppository (10 mg total) rectally daily as needed for moderate constipation.     cyclobenzaprine 5 MG tablet  Commonly known as:  FLEXERIL  Take 1 tablet (5 mg total) by mouth 3 (three) times daily as needed for muscle  spasms.     DOXYLAMINE SUCCINATE PO  Take 0.5-1 tablets by mouth at bedtime as needed (for sleep).     DSS 100 MG Caps  Take 100 mg by mouth 2 (two) times daily.     hydrocortisone 2.5 % rectal cream  Commonly known as:  ANUSOL-HC  Place rectally 2 (two) times daily.     metoCLOPramide 5 MG tablet  Commonly known as:  REGLAN  Take 1 tablet (5 mg total) by mouth every 8 (eight) hours as needed for nausea (if ondansetron (ZOFRAN) ineffective.).     ondansetron 4 MG tablet  Commonly known as:  ZOFRAN  Take 1 tablet (4 mg total) by mouth every 6 (six) hours as needed for nausea.     oxyCODONE 5 MG immediate release tablet  Commonly known as:  Oxy IR/ROXICODONE  Take 1-2 tablets (5-10 mg total) by mouth every 3 (three) hours as needed for breakthrough pain.     pantoprazole 40 MG tablet  Commonly known as:  PROTONIX  Take 1 tablet (40 mg total) by mouth daily.     polyethylene glycol packet  Commonly known as:  MIRALAX / GLYCOLAX  Take 17 g by mouth daily as needed for mild constipation.     rivaroxaban 10 MG Tabs tablet  Commonly known as:  XARELTO  - Take 1 tablet (10 mg total) by mouth daily with breakfast. Take Xarelto for two and a half more weeks, then discontinue Xarelto.  - Once the patient has completed the Xarelto, they may resume the 325 mg Aspirin.     traMADol 50 MG tablet  Commonly known as:  ULTRAM  Take 1 tablet (50 mg total) by mouth every 8 (eight) hours as needed (mild pain).      Pericolace - one twice a day - added written RX since no on hospital formulary.      Follow-up Information   Follow up with Loanne Drilling, MD. Schedule an appointment as soon as possible for a visit in 2 weeks.   Specialty:  Orthopedic Surgery   Contact information:   [address] [place] [address] [phone]       Signed: Patrica Duel 07/27/2013, 7:39 AM

## 2013-08-01 ENCOUNTER — Encounter: Payer: Self-pay | Admitting: Family Medicine

## 2013-08-01 NOTE — Progress Notes (Signed)
Received office notes from [facility] from visit on 07/19/13.  Lumbar epidural steroid injection, L5-S1 to the right.  Pt tolerated very well.  Preop bp 183/95; post procdure 175/84.  Follow up in 2 weeks. Sent to scan.

## 2013-08-03 ENCOUNTER — Non-Acute Institutional Stay (SKILLED_NURSING_FACILITY): Payer: Medicare Other | Admitting: Internal Medicine

## 2013-08-03 DIAGNOSIS — M1710 Unilateral primary osteoarthritis, unspecified knee: Secondary | ICD-10-CM

## 2013-08-03 DIAGNOSIS — M25561 Pain in right knee: Secondary | ICD-10-CM

## 2013-08-03 DIAGNOSIS — IMO0002 Reserved for concepts with insufficient information to code with codable children: Secondary | ICD-10-CM

## 2013-08-03 DIAGNOSIS — I10 Essential (primary) hypertension: Secondary | ICD-10-CM

## 2013-08-03 DIAGNOSIS — D62 Acute posthemorrhagic anemia: Secondary | ICD-10-CM

## 2013-08-03 DIAGNOSIS — M25569 Pain in unspecified knee: Secondary | ICD-10-CM

## 2013-08-09 ENCOUNTER — Non-Acute Institutional Stay (SKILLED_NURSING_FACILITY): Payer: Medicare Other | Admitting: Adult Health

## 2013-08-09 DIAGNOSIS — K219 Gastro-esophageal reflux disease without esophagitis: Secondary | ICD-10-CM

## 2013-08-09 DIAGNOSIS — M179 Osteoarthritis of knee, unspecified: Secondary | ICD-10-CM

## 2013-08-09 DIAGNOSIS — M1710 Unilateral primary osteoarthritis, unspecified knee: Secondary | ICD-10-CM

## 2013-08-09 DIAGNOSIS — I10 Essential (primary) hypertension: Secondary | ICD-10-CM

## 2013-08-09 DIAGNOSIS — IMO0002 Reserved for concepts with insufficient information to code with codable children: Secondary | ICD-10-CM

## 2013-08-09 DIAGNOSIS — K5900 Constipation, unspecified: Secondary | ICD-10-CM

## 2013-08-12 DIAGNOSIS — K5900 Constipation, unspecified: Secondary | ICD-10-CM | POA: Insufficient documentation

## 2013-08-12 NOTE — Progress Notes (Signed)
Patient ID: Tammy [name], female   DOB: 04-27-1941, 72 y.o.   MRN: [id number]                PROGRESS NOTE  DATE: 08/09/2013   FACILITY: [facility]  LEVEL OF CARE: SNF (31)  Acute Visit  CHIEF COMPLAINT:  Discharge Notes  HISTORY OF PRESENT ILLNESS: This is a 72 year old female who is for discharge home with Home health PT. DME: Rolling walker due to unsteady gait. She has been admitted to [facility] on 07/27/13 from [facility] with Osteoarthritis S/P right total knee arthroplasty. Patient was admitted to this facility for short-term rehabilitation after the patient's recent hospitalization.  Patient has completed SNF rehabilitation and therapy has cleared the patient for discharge.  Reassessment of ongoing problem(s):  HTN: Pt 's HTN remains stable.  Denies CP, sob, DOE, pedal edema, headaches, dizziness or visual disturbances.  No medications currently being used.  Last BP :101/68  GERD: pt's GERD is stable.  Denies ongoing heartburn, abd. Pain, nausea or vomiting.  Currently on a PPI & tolerates it without any adverse reactions.  CONSTIPATION: The constipation remains stable. No complications from the medications presently being used. Patient denies ongoing constipation, abdominal pain, nausea or vomiting.  PAST MEDICAL HISTORY : Reviewed.  No changes.  CURRENT MEDICATIONS: Reviewed per [facility]  REVIEW OF SYSTEMS:  GENERAL: no change in appetite, no fatigue, no weight changes, no fever, chills or weakness RESPIRATORY: no cough, SOB, DOE, wheezing, hemoptysis CARDIAC: no chest pain, edema or palpitations GI: no abdominal pain, diarrhea, constipation, heart burn, nausea or vomiting  PHYSICAL EXAMINATION  VS:  T97.5       P98       RR20      BP101/68      POX95 %       WT157.6 (Lb)  GENERAL: no acute distress, normal body habitus NECK: supple, trachea midline, no neck masses, no thyroid tenderness, no thyromegaly LYMPHATICS: no LAN in the  neck, no supraclavicular LAN RESPIRATORY: breathing is even & unlabored, BS CTAB CARDIAC: RRR, no murmur,no extra heart sounds, no edema GI: abdomen soft, normal BS, no masses, no tenderness, no hepatomegaly, no splenomegaly PSYCHIATRIC: the patient is alert & oriented to person, affect & behavior appropriate  LABS/RADIOLOGY: 07/27/13 WBC 10.2 hemoglobin 10.7 hematocrit 51.5 sodium 136 potassium 3.6 glucose 173 BUN 11 creatinine 0.68 calcium 9.3   ASSESSMENT/PLAN:  Osteoarthritis status post right total knee arthroplasty - for Home health PT  GERD - continue Protonix  Constipation - no complaints  Hypertension - well-controlled without medication   I have filled out patient's discharge paperwork and written prescriptions.  Patient will receive home health PT.  DME provided: Rolling walker  Total discharge time: Greater than 30 minutes Discharge time involved coordination of the discharge process with social worker, nursing staff and therapy department. Medical justification for home health services/DME verified.   CPT CODE: 95621

## 2013-08-12 NOTE — Progress Notes (Addendum)
Patient ID: Tammy [name], female   DOB: 02-03-1941, 72 y.o.   MRN: [id number]                      PROGRESS NOTE  DATE: 07/27/2013  FACILITY: Nursing Home Location: [facility]  LEVEL OF CARE: SNF (31)  Acute Visit  CHIEF COMPLAINT:  Follow-up hospitalization  HISTORY OF PRESENT ILLNESS: This is a 72 year old female who has been admitted to [facility] on 07/27/13 from [facility] with Osteoarthritis S/P right total knee arthroplasty. She has been admitted for a short-term rehabilitation.  REASSESSMENT OF ONGOING PROBLEM(S):  GERD: pt's GERD is stable.  Denies ongoing heartburn, abd. Pain, nausea or vomiting.  Currently on a PPI & tolerates it without any adverse reactions.  CONSTIPATION: The constipation remains stable. No complications from the medications presently being used. Patient denies ongoing constipation, abdominal pain, nausea or vomiting.  HTN: Pt 's HTN remains stable.  Denies CP, sob, DOE, pedal edema, headaches, dizziness or visual disturbances.  No medications currently being used.  Last BP : 137/65  PAST MEDICAL HISTORY : Reviewed.  No changes.  CURRENT MEDICATIONS: Reviewed per [facility]  REVIEW OF SYSTEMS:  GENERAL: no change in appetite, no fatigue, no weight changes, no fever, chills or weakness RESPIRATORY: no cough, SOB, DOE, wheezing, hemoptysis CARDIAC: no chest pain, edema or palpitations GI: no abdominal pain, diarrhea, constipation, heart burn, nausea or vomiting  PHYSICAL EXAMINATION  VS:  T99.5      P95     RR20      BP137/65     POX95 %     WT164.8 (Lb)  GENERAL: no acute distress, normal body habitus EYES: conjunctivae normal, sclerae normal, normal eye lids NECK: supple, trachea midline, no neck masses, no thyroid tenderness, no thyromegaly LYMPHATICS: no LAN in the neck, no supraclavicular LAN RESPIRATORY: breathing is even & unlabored, BS CTAB CARDIAC: RRR, no murmur,no extra heart sounds, no edema GI:  abdomen soft, normal BS, no masses, no tenderness, no hepatomegaly, no splenomegaly PSYCHIATRIC: the patient is alert & oriented to person, affect & behavior appropriate  LABS/RADIOLOGY: 07/27/13 WBC 10.2 hemoglobin 10.7 hematocrit 51.5 sodium 136 potassium 3.6 glucose 173 BUN 11 creatinine 0.68 calcium 9.3   ASSESSMENT/PLAN:  Osteoarthritis status post right total knee arthroplasty - for rehabilitation  GERD - continue Protonix  Constipation - no complaints  Hypertension - well-controlled without medication   CPT CODE: 40981

## 2013-09-09 ENCOUNTER — Encounter: Payer: Self-pay | Admitting: Family Medicine

## 2013-09-09 NOTE — Progress Notes (Signed)
Received office notes from [facility] from visit on 08/24/2013.  Pt seen for right hip pain, OA and lumbago, lumbar spondylosis.  Pt given 1 cc Depo Medrol  With Lidocaine. Follow up in 2 weeks.  Sent to scan.

## 2013-09-29 ENCOUNTER — Encounter: Payer: Self-pay | Admitting: Family Medicine

## 2013-09-29 NOTE — Progress Notes (Deleted)
   Subjective:    Patient ID: Tammy [name], female    DOB: [birth year], 73 y.o.   MRN: [id number]  HPI    Review of Systems     Objective:   Physical Exam        Assessment & Plan:

## 2013-09-29 NOTE — Progress Notes (Signed)
Received office notes from [facility] from visit on 09/13/2013.  Pt seen for follow up on back.  Pt inquired about Fentanyl patch and was told to try managing pain with percocet/Oxycodone and if Dr. Wynelle Link is concerned pt can see Dr. Nelva Bush postop.  Pt can contact if she needs trigger point injection after left knee replacement.  Pt to follow prn.  Restarted on Ultram 50 mg tid #90 with 0rf.

## 2013-10-10 ENCOUNTER — Encounter: Payer: Self-pay | Admitting: Internal Medicine

## 2013-10-10 DIAGNOSIS — M25561 Pain in right knee: Secondary | ICD-10-CM | POA: Insufficient documentation

## 2013-10-10 NOTE — Progress Notes (Signed)
Patient ID: Tammy [name], female   DOB: Aug 23, 1941, 73 y.o.   MRN: [id number]               HISTORY & PHYSICAL  DATE: 08/03/2013     FACILITY: [facility]  LEVEL OF CARE: SNF (31)  ALLERGIES:  Allergies  Allergen Reactions  . Hydrocodone     Made her feel very strange, wired, restless   . Penicillins Rash    CHIEF COMPLAINT:  Manage right knee osteoarthritis, acute blood loss anemia, and hypertension.    HISTORY OF PRESENT ILLNESS:  The patient is a 73 year-old, Caucasian female.    KNEE OSTEOARTHRITIS: Patient had a history of pain and functional disability in the knee due to end-stage osteoarthritis and has failed nonsurgical conservative treatments. Patient had worsening of pain with activity and weight bearing, pain that interfered with activities of daily living & pain with passive range of motion. Radiographs showed bone-on-bone arthritis in the medial and patellofemoral compartments with large varus deformity.  Therefore patient underwent total knee arthroplasty and tolerated the procedure well. Patient is admitted to this facility for sort short-term rehabilitation. Patient is complaining of uncontrolled right knee pain and low back pain.     ANEMIA:  Postoperatively, patient suffered acute blood loss.   The anemia has been stable. The patient denies fatigue, melena or hematochezia.  The patient is currently not on iron.   Last hemoglobins are:  10.9, 10.7.     HTN: Pt 's HTN remains stable.  Denies CP, sob, DOE, pedal edema, headaches, dizziness or visual disturbances.  No complications from the medications currently being used.  Last BP :  160/77.    PAST MEDICAL HISTORY :  Past Medical History  Diagnosis Date  . Chicken pox   . Varicose vein   . Depression     mild, never on medication  . GERD (gastroesophageal reflux disease)   . Heart murmur     told in 2012 benign  . Hyperglycemia     borderline  . Anxiety   . Arthritis     knees and  back  . High cholesterol     borderline, no meds  . Hypertension     high blood pressure readings, no meds  . DDD (degenerative disc disease), lumbar   . Scoliosis   . Osteoarthritis   . Hemorrhoids     PAST SURGICAL HISTORY: Past Surgical History  Procedure Laterality Date  . Tonsillectomy and adenoidectomy  1973  . Abdominal hysterectomy  1972  . Incision and drainage breast abscess Bilateral 1966, 1967  . Ovarian cyst surgery Bilateral 1991  . Tubal ligation  1970  . Dental surgery  2007    left jaw bone graft, 2 implants on left, 2 implants on right  . Hemorroidectomy  1975  . Mass excision N/A 04/22/2013    Procedure: EXCISION MULTPILE soft tissue masses and abnormal skin lesion left thigh /left chest/ left posterior shoulder/ right flank ;  Surgeon: Harl Bowie, MD;  Location: [facility];  Service: General;  Laterality: N/A;  multiple sites:  left thigh, left shoulder, right flank, right upper arm  . Ovarian cyst surgery    . Colonoscopy  05/27/2013  . Hemorrhoid banding  06/03/13  . Total knee arthroplasty Right 07/25/2013    Procedure: RIGHT TOTAL KNEE ARTHROPLASTY;  Surgeon: Gearlean Alf, MD;  Location: [facility];  Service: Orthopedics;  Laterality: Right;  Left knee cortisone injection  SOCIAL HISTORY:  reports that she has never smoked. She has never used smokeless tobacco. She reports that she does not drink alcohol or use illicit drugs.  FAMILY HISTORY:  Family History  Problem Relation Age of Onset  . Alcohol abuse Father   . Heart disease Father   . Arthritis Mother   . Stroke Mother   . Hypertension Mother   . Diabetes Mother   . Breast cancer Maternal Grandmother   . Breast cancer Maternal Aunt   . Colon cancer Maternal Uncle   . Esophageal cancer Neg Hx   . Stomach cancer Neg Hx   . Rectal cancer Neg Hx     CURRENT MEDICATIONS: Reviewed per [facility]  REVIEW OF SYSTEMS:  See HPI otherwise 14 point ROS is negative.  PHYSICAL EXAMINATION  VS:  T  98.7       P 80      RR 18      BP 160/77      POX 98%         GENERAL: no acute distress, normal body habitus EYES: conjunctivae normal, sclerae normal, normal eye lids MOUTH/THROAT: lips without lesions,no lesions in the mouth,tongue is without lesions,uvula elevates in midline NECK: supple, trachea midline, no neck masses, no thyroid tenderness, no thyromegaly LYMPHATICS: no LAN in the neck, no supraclavicular LAN RESPIRATORY: breathing is even & unlabored, BS CTAB CARDIAC: RRR, no murmur,no extra heart sounds EDEMA/VARICOSITIES: right lower extremity has +1 edema   GI:  ABDOMEN: abdomen soft, normal BS, no masses, no tenderness  LIVER/SPLEEN: no hepatomegaly, no splenomegaly MUSCULOSKELETAL: HEAD: normal to inspection & palpation BACK: no kyphosis, scoliosis or spinal processes tenderness EXTREMITIES: LEFT UPPER EXTREMITY: full range of motion, normal strength & tone RIGHT UPPER EXTREMITY:  full range of motion, normal strength & tone LEFT LOWER EXTREMITY:  full range of motion, normal strength & tone RIGHT LOWER EXTREMITY: strength intact, range of motion not tested due to surgery   PSYCHIATRIC: the patient is alert & oriented to person, affect & behavior appropriate  LABS/RADIOLOGY: PT 12.6, INR 0.96, PTT 13.    MRSA by PCR negative.     Staph aureus by PCR positive.    Urinalysis negative.    Labs reviewed: Basic Metabolic Panel:  Recent Labs  07/13/13 1530 07/26/13 0450 07/27/13 0515  NA 137 133* 136  K 4.5 4.5 3.6  CL 102 100 100  CO2 26 23 25   GLUCOSE 96 195* 173*  BUN 13 11 11   CREATININE 0.79 0.58 0.68  CALCIUM 9.7 8.9 9.3   CBC:  Recent Labs  02/21/13 1225  07/13/13 1530 07/26/13 0450 07/27/13 0515  WBC 5.7  < > 5.2 12.0* 10.2  NEUTROABS 3.7  --   --   --   --   HGB 13.9  < > 14.1 10.9* 10.7*  HCT 41.3  < > 41.2 32.3* 31.5*  MCV 92.2  < > 90.0 89.7 91.0  PLT 198.0  < > 197 211 212  < > = values in this interval not displayed.  Lipid  Panel:  Recent Labs  02/21/13 1225  HDL 49.10   ASSESSMENT/PLAN:  Right knee osteoarthritis.  Status post right total knee arthroplasty.  Continue rehabilitation.    Acute blood loss anemia.  Reassess hemoglobin level.    Hypertension.  Blood pressure elevated, likely due to pain.  We will review a log.    Right knee pain.  Uncontrolled.  Increase Ultram to q.6.    Low back  pain.  Uncontrolled.  Start Lidoderm patch.     GERD.  Well controlled.    Check CBC and BMP.    THN Metrics:    Nonsmoker.  On aspirin 325 mg q.d.    I have reviewed patient's medical records received at admission/from hospitalization.    CPT CODE: 30092    Gayani Y Dasanayaka, [place] [phone]

## 2013-10-13 ENCOUNTER — Telehealth: Payer: Self-pay

## 2013-10-13 NOTE — Telephone Encounter (Signed)
Received a fax from [facility] for medical clearance.  Per Dr. Maudie Mercury pt needs an office visit.  Called pt to make aware. Pt aware appt 10/14/13 at 1:45 pm.

## 2013-10-14 ENCOUNTER — Encounter: Payer: Medicare Other | Admitting: Family Medicine

## 2013-10-14 NOTE — Progress Notes (Signed)
Error   This encounter was created in error - please disregard. 

## 2013-10-17 ENCOUNTER — Ambulatory Visit (INDEPENDENT_AMBULATORY_CARE_PROVIDER_SITE_OTHER): Payer: Medicare Other | Admitting: Family Medicine

## 2013-10-17 ENCOUNTER — Encounter: Payer: Self-pay | Admitting: Family Medicine

## 2013-10-17 VITALS — BP 124/70 | Temp 98.4°F | Wt 160.0 lb

## 2013-10-17 DIAGNOSIS — Z780 Asymptomatic menopausal state: Secondary | ICD-10-CM

## 2013-10-17 DIAGNOSIS — M1710 Unilateral primary osteoarthritis, unspecified knee: Secondary | ICD-10-CM

## 2013-10-17 DIAGNOSIS — Z01818 Encounter for other preprocedural examination: Secondary | ICD-10-CM

## 2013-10-17 DIAGNOSIS — IMO0002 Reserved for concepts with insufficient information to code with codable children: Secondary | ICD-10-CM

## 2013-10-17 DIAGNOSIS — M179 Osteoarthritis of knee, unspecified: Secondary | ICD-10-CM

## 2013-10-17 NOTE — Progress Notes (Signed)
Pre visit review using our clinic review tool, if applicable. No additional management support is needed unless otherwise documented below in the visit note. 

## 2013-10-17 NOTE — Patient Instructions (Addendum)
-  we will send the office visit note to your surgeon  Recommendations for optimizing general medical care prior to surgery:  -advised patient to discuss specific risks morbidity and mortality of surgery with surgeon, CV risks discussed with patient  -advised patient will defer to surgeon for post-op DVT prophylaxis and post op care  -specific medical recommendations: stop asa and lodine 1 week before surgery  -at this time no recommendations to defer surgery or for further CV testing prior to surgery

## 2013-10-17 NOTE — Progress Notes (Signed)
HPI:  Patient is seen for optimization of general medical care prior to surgery. Surgery type: L total knee Date of surgery: June 2015 or earlier if opening  Patient reports: Kidney disease? No Prior surgeries/Issues following anesthesia? See PMH, numerous prior surgeies with no issues with anesthesia, recently underwent R knee total arthroplasty Hx MI, heart arrythmia, CHF, angina or stroke? none Epilepsy or Seizures? none Arthritis or problems with neck or jaw? none Thyroid disease? none Liver disease? none Asthma, COPD or chronic lung disease? none Diabetes? none  Other: Poor nutrition, Frail or other: no  METS:  ?Can do heavy work around the house such as scrubbing floors or lifting or moving heavy furniture (between 4 and 10 METs). yes  AHA Risks: Major predictors that require intensive management and may lead to delay in or cancellation of the operative procedure unless emergent: NONE   Other clinical predictors that warrant careful assessment of current status: NONE  Type of surgery and Risk: Intermediate risk (reported risk of cardiac death or nonfatal MI generally 1 to 5 percent): [facility] Orthopedic surgery   Medications that need to be addressed prior to surgery: ASA, lodine  ROS: See pertinent positives and negatives per HPI. 11 point ROS negative except where noted.  Past Medical History  Diagnosis Date  . Chicken pox   . Varicose vein   . Depression     mild, never on medication  . GERD (gastroesophageal reflux disease)   . Heart murmur     told in 2012 benign  . Hyperglycemia     borderline  . Anxiety   . Arthritis     knees and back  . High cholesterol     borderline, no meds  . Hypertension     high blood pressure readings, no meds  . DDD (degenerative disc disease), lumbar   . Scoliosis   . Osteoarthritis   . Hemorrhoids     Past Surgical History  Procedure Laterality Date  . Tonsillectomy and adenoidectomy  1973  . Abdominal hysterectomy   1972  . Incision and drainage breast abscess Bilateral 1966, 1967  . Ovarian cyst surgery Bilateral 1991  . Tubal ligation  1970  . Dental surgery  2007    left jaw bone graft, 2 implants on left, 2 implants on right  . Hemorroidectomy  1975  . Mass excision N/A 04/22/2013    Procedure: EXCISION MULTPILE soft tissue masses and abnormal skin lesion left thigh /left chest/ left posterior shoulder/ right flank ;  Surgeon: Harl Bowie, MD;  Location: [facility];  Service: General;  Laterality: N/A;  multiple sites:  left thigh, left shoulder, right flank, right upper arm  . Ovarian cyst surgery    . Colonoscopy  05/27/2013  . Hemorrhoid banding  06/03/13  . Total knee arthroplasty Right 07/25/2013    Procedure: RIGHT TOTAL KNEE ARTHROPLASTY;  Surgeon: Gearlean Alf, MD;  Location: [facility];  Service: Orthopedics;  Laterality: Right;  Left knee cortisone injection    Family History  Problem Relation Age of Onset  . Alcohol abuse Father   . Heart disease Father   . Arthritis Mother   . Stroke Mother   . Hypertension Mother   . Diabetes Mother   . Breast cancer Maternal Grandmother   . Breast cancer Maternal Aunt   . Colon cancer Maternal Uncle   . Esophageal cancer Neg Hx   . Stomach cancer Neg Hx   . Rectal cancer Neg Hx  History   Social History  . Marital Status: Divorced    Spouse Name: N/A    Number of Children: N/A  . Years of Education: N/A   Social History Main Topics  . Smoking status: Never Smoker   . Smokeless tobacco: Never Used  . Alcohol Use: No  . Drug Use: No  . Sexual Activity: Not on file   Other Topics Concern  . 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      Spiritual Beliefs: spiritual - but not religious      Lifestyle: rolls side to side, true back, mule kicks, walks 3-4 days per week for 1 mile; diet is ok - not great             Current outpatient prescriptions:acetaminophen  (TYLENOL) 325 MG tablet, Take 2 tablets (650 mg total) by mouth every 6 (six) hours as needed for mild pain (or Fever >/= 101)., Disp: 60 tablet, Rfl: 0;  bisacodyl (DULCOLAX) 10 MG suppository, Place 1 suppository (10 mg total) rectally daily as needed for moderate constipation., Disp: 12 suppository, Rfl: 0 cyclobenzaprine (FLEXERIL) 5 MG tablet, Take 1 tablet (5 mg total) by mouth 3 (three) times daily as needed for muscle spasms., Disp: 80 tablet, Rfl: 0;  docusate sodium 100 MG CAPS, Take 100 mg by mouth 2 (two) times daily., Disp: 60 capsule, Rfl: 0;  DOXYLAMINE SUCCINATE PO, Take 0.5-1 tablets by mouth at bedtime as needed (for sleep)., Disp: , Rfl:  hydrocortisone (ANUSOL-HC) 2.5 % rectal cream, Place rectally 2 (two) times daily., Disp: 30 g, Rfl: 0;  metoCLOPramide (REGLAN) 5 MG tablet, Take 1 tablet (5 mg total) by mouth every 8 (eight) hours as needed for nausea (if ondansetron (ZOFRAN) ineffective.)., Disp: 40 tablet, Rfl: 0;  ondansetron (ZOFRAN) 4 MG tablet, Take 1 tablet (4 mg total) by mouth every 6 (six) hours as needed for nausea., Disp: 40 tablet, Rfl: 0 oxyCODONE (OXY IR/ROXICODONE) 5 MG immediate release tablet, Take 1-2 tablets (5-10 mg total) by mouth every 3 (three) hours as needed for breakthrough pain., Disp: 80 tablet, Rfl: 0;  pantoprazole (PROTONIX) 40 MG tablet, Take 1 tablet (40 mg total) by mouth daily., Disp: 30 tablet, Rfl: 3;  polyethylene glycol (MIRALAX / GLYCOLAX) packet, Take 17 g by mouth daily as needed for mild constipation., Disp: 14 each, Rfl: 0 rivaroxaban (XARELTO) 10 MG TABS tablet, Take 1 tablet (10 mg total) by mouth daily with breakfast. Take Xarelto for two and a half more weeks, then discontinue Xarelto. Once the patient has completed the Xarelto, they may resume the 325 mg Aspirin., Disp: 19 tablet, Rfl: 0;  traMADol (ULTRAM) 50 MG tablet, Take 1 tablet (50 mg total) by mouth every 8 (eight) hours as needed (mild pain)., Disp: 60 tablet, Rfl:  0  EXAM:  Filed Vitals:   10/17/13 1335  BP: 124/70  Temp: 98.4 F (36.9 C)    There is no weight on file to calculate BMI.  GENERAL: vitals reviewed and listed above, alert, oriented, appears well hydrated and in no acute distress  HEENT: atraumatic, conjunttiva clear, no obvious abnormalities on inspection of external nose and ears  NECK: no obvious masses on inspection, no carotid bruits  LUNGS: clear to auscultation bilaterally, no wheezes, rales or rhonchi, good air movement  CV: HRRR, no peripheral edema, no JVD, BP normal range, normal radial pulses  MS: moves all extremities  without noticeable abnormality  PSYCH: pleasant and cooperative, no obvious depression or anxiety  ASSESSMENT AND PLAN:  Discussed the following assessment and plan:  Pre-operative examination  Osteoarthritis, knee  Postmenopausal - Plan: DG Bone Density  Assessment:  -Risk factors: none other then age, she is a very active and vibrant 73 yo old whom recently underwent R knee arthroplasty and recovered well -Surgery Risks:intermediate  -age, nutritional status, fraility: good nutritional status, age >68, no fraility  -functional capacity: > 4 METs wethout symptoms  -comorbidities: none  Patient Specific Risks: patient is low risk for intermediate risks surgery   Recommendations for optimizing general medical care prior to surgery:  -advised patient to discuss specific risks morbidity and mortality of surgery with surgeon, CV risks discussed with patient  -advised patient will defer to surgeon for post-op DVT prophylaxis and post op care  -specific medical recommendations: stop asa and lodine 1 week before surgery  -at this time no recommendations to defer surgery or for further CV testing prior to surgery  HM: -wants bone density  -Patient advised to return or notify a doctor immediately if symptoms worsen or persist or new concerns arise.  There are no Patient Instructions on file  for this visit.   Colin Benton R.

## 2013-10-31 ENCOUNTER — Ambulatory Visit
Admission: RE | Admit: 2013-10-31 | Discharge: 2013-10-31 | Disposition: A | Discharge status: Home / Self Care | Payer: Medicare Other | Source: Ambulatory Visit | Attending: Family Medicine | Admitting: Family Medicine

## 2013-10-31 DIAGNOSIS — E2839 Other primary ovarian failure: Secondary | ICD-10-CM

## 2013-11-01 ENCOUNTER — Telehealth: Payer: Self-pay | Admitting: Family Medicine

## 2013-11-01 ENCOUNTER — Other Ambulatory Visit: Payer: Self-pay | Admitting: Adult Health

## 2013-11-01 ENCOUNTER — Other Ambulatory Visit: Payer: Self-pay | Admitting: Family Medicine

## 2013-11-01 NOTE — Telephone Encounter (Signed)
-  bone density test borderline. Would advise appt to discuss results and to help her decide on treatment.

## 2013-11-01 NOTE — Telephone Encounter (Signed)
Whatever her surgeon advises . A vitamin D might be good in regards to the bone density - but not needed prior to surgery. If they advise the other labs then that is up to her.CRP is very non-specific and I don't advise it for routine screening tests, but I don't know if her surgeon needed it for some reason or not.

## 2013-11-01 NOTE — Telephone Encounter (Signed)
Called and spoke with pt and pt staes she will have labs done prior to surgery on knee on June 8th.  Pt would like to know if she should have c reactive protein, vitamin d, and thyroid checked?  Pt states she will make an appt with her "bone doctor" to discuss bone density.

## 2013-11-02 NOTE — Telephone Encounter (Signed)
Called and spoke with pt and pt is aware.  

## 2013-12-02 ENCOUNTER — Other Ambulatory Visit: Payer: Self-pay | Admitting: Family Medicine

## 2014-01-11 NOTE — Progress Notes (Signed)
Arlee Muslim, PA  - Please enter preop orders in Epic for [facility] - surg date 6/8.  Thanks.

## 2014-01-16 ENCOUNTER — Other Ambulatory Visit: Payer: Self-pay | Admitting: Orthopedic Surgery

## 2014-01-18 ENCOUNTER — Encounter (HOSPITAL_COMMUNITY): Payer: Self-pay | Admitting: Pharmacy Technician

## 2014-01-18 DIAGNOSIS — S20219A Contusion of unspecified front wall of thorax, initial encounter: Secondary | ICD-10-CM

## 2014-01-18 HISTORY — DX: Contusion of unspecified front wall of thorax, initial encounter: S20.219A

## 2014-01-20 ENCOUNTER — Ambulatory Visit (INDEPENDENT_AMBULATORY_CARE_PROVIDER_SITE_OTHER)
Admission: RE | Admit: 2014-01-20 | Discharge: 2014-01-20 | Disposition: A | Discharge status: Home / Self Care | Payer: Medicare Other | Source: Ambulatory Visit | Attending: Family Medicine | Admitting: Family Medicine

## 2014-01-20 ENCOUNTER — Ambulatory Visit (INDEPENDENT_AMBULATORY_CARE_PROVIDER_SITE_OTHER): Payer: Medicare Other | Admitting: Family Medicine

## 2014-01-20 ENCOUNTER — Encounter: Payer: Self-pay | Admitting: Family Medicine

## 2014-01-20 VITALS — BP 140/88 | HR 73 | Temp 97.8°F | Ht 60.0 in | Wt 161.0 lb

## 2014-01-20 DIAGNOSIS — R0781 Pleurodynia: Secondary | ICD-10-CM

## 2014-01-20 DIAGNOSIS — R079 Chest pain, unspecified: Secondary | ICD-10-CM

## 2014-01-20 HISTORY — DX: Pleurodynia: R07.81

## 2014-01-20 NOTE — Progress Notes (Signed)
Pre visit review using our clinic review tool, if applicable. No additional management support is needed unless otherwise documented below in the visit note. 

## 2014-01-20 NOTE — Patient Instructions (Signed)
-  go get xray

## 2014-01-20 NOTE — Patient Instructions (Signed)
Tammy [name]  01/20/2014   Your procedure is scheduled on:  01/30/2014   1135am-1224pm  Report to [facility].  Follow the Signs to [place] at   [phone]     am  Call this number if you have problems the morning of surgery: [phone]   Remember:   Do not eat food or drink liquids after midnight.   Take these medicines the morning of surgery with A SIP OF WATER:    Do not wear jewelry, make-up or nail polish.  Do not wear lotions, powders, or perfumes.   Do not shave 48 hours prior to surgery.   Do not bring valuables to the hospital.  Contacts, dentures or bridgework may not be worn into surgery.  Leave suitcase in the car. After surgery it may be brought to your room.  For patients admitted to the hospital, checkout time is 11:00 AM the day of  discharge.    [place] - Preparing for Surgery Before surgery, you can play an important role.  Because skin is not sterile, your skin needs to be as free of germs as possible.  You can reduce the number of germs on your skin by washing with CHG (chlorahexidine gluconate) soap before surgery.  CHG is an antiseptic cleaner which kills germs and bonds with the skin to continue killing germs even after washing. Please DO NOT use if you have an allergy to CHG or antibacterial soaps.  If your skin becomes reddened/irritated stop using the CHG and inform your nurse when you arrive at Short Stay. Do not shave (including legs and underarms) for at least 48 hours prior to the first CHG shower.  You may shave your face/neck. Please follow these instructions carefully:  1.  Shower with CHG Soap the night before surgery and the  morning of Surgery.  2.  If you choose to wash your hair, wash your hair first as usual with your  normal  shampoo.  3.  After you shampoo, rinse your hair and body thoroughly to remove the  shampoo.                           4.  Use CHG as you would any other liquid soap.  You can apply chg directly  to  the skin and wash                       Gently with a scrungie or clean washcloth.  5.  Apply the CHG Soap to your body ONLY FROM THE NECK DOWN.   Do not use on face/ open                           Wound or open sores. Avoid contact with eyes, ears mouth and genitals (private parts).                       Wash face,  Genitals (private parts) with your normal soap.             6.  Wash thoroughly, paying special attention to the area where your surgery  will be performed.  7.  Thoroughly rinse your body with warm water from the neck down.  8.  DO NOT shower/wash with your normal soap after using and rinsing off  the CHG Soap.  9.  Pat yourself dry with a clean towel.            10.  Wear clean pajamas.            11.  Place clean sheets on your bed the night of your first shower and do not  sleep with pets. Day of Surgery : Do not apply any lotions/deodorants the morning of surgery.  Please wear clean clothes to the hospital/surgery center.  FAILURE TO FOLLOW THESE INSTRUCTIONS MAY RESULT IN THE CANCELLATION OF YOUR SURGERY PATIENT SIGNATURE_________________________________  NURSE SIGNATURE__________________________________  ________________________________________________________________________  WHAT IS A BLOOD TRANSFUSION? Blood Transfusion Information  A transfusion is the replacement of blood or some of its parts. Blood is made up of multiple cells which provide different functions.  Red blood cells carry oxygen and are used for blood loss replacement.  White blood cells fight against infection.  Platelets control bleeding.  Plasma helps clot blood.  Other blood products are available for specialized needs, such as hemophilia or other clotting disorders. BEFORE THE TRANSFUSION  Who gives blood for transfusions?   Healthy volunteers who are fully evaluated to make sure their blood is safe. This is blood bank blood. Transfusion therapy is the safest it has ever  been in the practice of medicine. Before blood is taken from a donor, a complete history is taken to make sure that person has no history of diseases nor engages in risky social behavior (examples are intravenous drug use or sexual activity with multiple partners). The donor's travel history is screened to minimize risk of transmitting infections, such as malaria. The donated blood is tested for signs of infectious diseases, such as HIV and hepatitis. The blood is then tested to be sure it is compatible with you in order to minimize the chance of a transfusion reaction. If you or a relative donates blood, this is often done in anticipation of surgery and is not appropriate for emergency situations. It takes many days to process the donated blood. RISKS AND COMPLICATIONS Although transfusion therapy is very safe and saves many lives, the main dangers of transfusion include:   Getting an infectious disease.  Developing a transfusion reaction. This is an allergic reaction to something in the blood you were given. Every precaution is taken to prevent this. The decision to have a blood transfusion has been considered carefully by your caregiver before blood is given. Blood is not given unless the benefits outweigh the risks. AFTER THE TRANSFUSION  Right after receiving a blood transfusion, you will usually feel much better and more energetic. This is especially true if your red blood cells have gotten low (anemic). The transfusion raises the level of the red blood cells which carry oxygen, and this usually causes an energy increase.  The nurse administering the transfusion will monitor you carefully for complications. HOME CARE INSTRUCTIONS  No special instructions are needed after a transfusion. You may find your energy is better. Speak with your caregiver about any limitations on activity for underlying diseases you may have. SEEK MEDICAL CARE IF:   Your condition is not improving after your  transfusion.  You develop redness or irritation at the intravenous (IV) site. SEEK IMMEDIATE MEDICAL CARE IF:  Any of the following symptoms occur over the next 12 hours:  Shaking chills.  You have a temperature by mouth above 102 F (38.9 C), not controlled by medicine.  Chest, back, or muscle pain.  People around you feel you are not acting correctly or  are confused.  Shortness of breath or difficulty breathing.  Dizziness and fainting.  You get a rash or develop hives.  You have a decrease in urine output.  Your urine turns a dark color or changes to pink, red, or brown. Any of the following symptoms occur over the next 10 days:  You have a temperature by mouth above 102 F (38.9 C), not controlled by medicine.  Shortness of breath.  Weakness after normal activity.  The white part of the eye turns yellow (jaundice).  You have a decrease in the amount of urine or are urinating less often.  Your urine turns a dark color or changes to pink, red, or brown. Document Released: 08/08/2000 Document Revised: 11/03/2011 Document Reviewed: 03/27/2008 ExitCare Patient Information 2014 [place].  _______________________________________________________________________  Incentive Spirometer  An incentive spirometer is a tool that can help keep your lungs clear and active. This tool measures how well you are filling your lungs with each breath. Taking long deep breaths may help reverse or decrease the chance of developing breathing (pulmonary) problems (especially infection) following:  A long period of time when you are unable to move or be active. BEFORE THE PROCEDURE   If the spirometer includes an indicator to show your best effort, your nurse or respiratory therapist will set it to a desired goal.  If possible, sit up straight or lean slightly forward. Try not to slouch.  Hold the incentive spirometer in an upright position. INSTRUCTIONS FOR USE  1. Sit on the  edge of your bed if possible, or sit up as far as you can in bed or on a chair. 2. Hold the incentive spirometer in an upright position. 3. Breathe out normally. 4. Place the mouthpiece in your mouth and seal your lips tightly around it. 5. Breathe in slowly and as deeply as possible, raising the piston or the ball toward the top of the column. 6. Hold your breath for 3-5 seconds or for as long as possible. Allow the piston or ball to fall to the bottom of the column. 7. Remove the mouthpiece from your mouth and breathe out normally. 8. Rest for a few seconds and repeat Steps 1 through 7 at least 10 times every 1-2 hours when you are awake. Take your time and take a few normal breaths between deep breaths. 9. The spirometer may include an indicator to show your best effort. Use the indicator as a goal to work toward during each repetition. 10. After each set of 10 deep breaths, practice coughing to be sure your lungs are clear. If you have an incision (the cut made at the time of surgery), support your incision when coughing by placing a pillow or rolled up towels firmly against it. Once you are able to get out of bed, walk around indoors and cough well. You may stop using the incentive spirometer when instructed by your caregiver.  RISKS AND COMPLICATIONS  Take your time so you do not get dizzy or light-headed.  If you are in pain, you may need to take or ask for pain medication before doing incentive spirometry. It is harder to take a deep breath if you are having pain. AFTER USE  Rest and breathe slowly and easily.  It can be helpful to keep track of a log of your progress. Your caregiver can provide you with a simple table to help with this. If you are using the spirometer at home, follow these instructions: [place] IF:  You are having difficultly using the spirometer.  You have trouble using the spirometer as often as instructed.  Your pain medication is not giving enough  relief while using the spirometer.  You develop fever of 100.5 F (38.1 C) or higher. SEEK IMMEDIATE MEDICAL CARE IF:   You cough up bloody sputum that had not been present before.  You develop fever of 102 F (38.9 C) or greater.  You develop worsening pain at or near the incision site. MAKE SURE YOU:   Understand these instructions.  Will watch your condition.  Will get help right away if you are not doing well or get worse. Document Released: 12/22/2006 Document Revised: 11/03/2011 Document Reviewed: 02/22/2007 ExitCare Patient Information 2014 ExitCare, [place].   ________________________________________________________________________    Please read over the following fact sheets that you were given: MRSA Information, coughing and deep breathing exercises, leg exercises

## 2014-01-20 NOTE — Progress Notes (Signed)
No chief complaint on file.   HPI:  Tammy [name] is here for an acute visit for:  Rib Pain: -started 3 days ago - was reaching for something and his lower L ribs into chair -has been tender in this area since -hurts worse with certain movements -took some of her pain meds and this helps -she has been anxious about this and worried about a rib fx as has knee surgery coming [place] -denies: fevers, malaise, cough, SOB, wheezing, urinary or bowel symptoms  HTN: -elevated BP - she is anxious and feels this is why BP up - better on recheck  ROS: See pertinent positives and negatives per HPI.  Past Medical History  Diagnosis Date  . Chicken pox   . Varicose vein   . Depression     mild, never on medication  . GERD (gastroesophageal reflux disease)   . Heart murmur     told in 2012 benign  . Hyperglycemia     borderline  . Anxiety   . Arthritis     knees and back  . High cholesterol     borderline, no meds  . Hypertension     high blood pressure readings, no meds  . DDD (degenerative disc disease), lumbar   . Scoliosis   . Osteoarthritis   . Hemorrhoids     Past Surgical History  Procedure Laterality Date  . Tonsillectomy and adenoidectomy  1973  . Abdominal hysterectomy  1972  . Incision and drainage breast abscess Bilateral 1966, 1967  . Ovarian cyst surgery Bilateral 1991  . Tubal ligation  1970  . Dental surgery  2007    left jaw bone graft, 2 implants on left, 2 implants on right  . Hemorroidectomy  1975  . Mass excision N/A 04/22/2013    Procedure: EXCISION MULTPILE soft tissue masses and abnormal skin lesion left thigh /left chest/ left posterior shoulder/ right flank ;  Surgeon: Harl Bowie, MD;  Location: [facility];  Service: General;  Laterality: N/A;  multiple sites:  left thigh, left shoulder, right flank, right upper arm  . Ovarian cyst surgery    . Colonoscopy  05/27/2013  . Hemorrhoid banding  06/03/13  . Total knee arthroplasty Right 07/25/2013   Procedure: RIGHT TOTAL KNEE ARTHROPLASTY;  Surgeon: Gearlean Alf, MD;  Location: [facility];  Service: Orthopedics;  Laterality: Right;  Left knee cortisone injection    Family History  Problem Relation Age of Onset  . Alcohol abuse Father   . Heart disease Father   . Arthritis Mother   . Stroke Mother   . Hypertension Mother   . Diabetes Mother   . Breast cancer Maternal Grandmother   . Breast cancer Maternal Aunt   . Colon cancer Maternal Uncle   . Esophageal cancer Neg Hx   . Stomach cancer Neg Hx   . Rectal cancer Neg Hx     History   Social History  . Marital Status: Divorced    Spouse Name: N/A    Number of Children: N/A  . Years of Education: N/A   Social History Main Topics  . Smoking status: Never Smoker   . Smokeless tobacco: Never Used  . Alcohol Use: No  . Drug Use: No  . Sexual Activity: None   Other Topics Concern  . 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 , Disp: , Rfl: ;  methocarbamol (ROBAXIN) 500 MG tablet, Take 500 mg by mouth 3 (three) times daily as needed for muscle spasms. , Disp: , Rfl:  oxyCODONE-acetaminophen (PERCOCET) 5-325 MG per tablet, Take by mouth every 4 (four) hours as needed for severe pain., Disp: , Rfl: ;  pantoprazole (PROTONIX) 40 MG tablet, Take 40 mg by mouth every morning., Disp: , Rfl: ;  traMADol (ULTRAM) 50 MG tablet, Take 50 mg by mouth 3 (three) times daily as needed., Disp: , Rfl:   EXAM:  Filed Vitals:   01/20/14 0843  BP: 140/88  Pulse: 73  Temp: 97.8 F  (36.6 C)    Body mass index is 31.44 kg/(m^2).  GENERAL: vitals reviewed and listed above, alert, oriented, appears well hydrated and in no acute distress  HEENT: atraumatic, conjunttiva clear, no obvious abnormalities on inspection of external nose and ears  NECK: no obvious masses on inspection  LUNGS: clear to auscultation bilaterally, no wheezes, rales or rhonchi, good air movement  CV: HRRR, no peripheral edema  MS: moves all extremities without noticeable abnormality -TTP soft tissues and Lower lateral ribs mid axillary line on L, no bruising on inspection, no crepitus  PSYCH: pleasant and cooperative, no obvious depression or anxiety  ASSESSMENT AND PLAN:  Discussed the following assessment and plan:  Rib pain on left side - Plan: DG Chest 2 View  -BP better on recheck and likely related to anxiety -lateral rib cage TTP - will get CXR per her request and notify ortho if fx -Patient advised to return or notify a doctor immediately if symptoms worsen or persist or new concerns arise.  Patient Instructions  -go get xray     Lucretia Kern

## 2014-01-23 ENCOUNTER — Encounter (HOSPITAL_COMMUNITY)
Admission: RE | Admit: 2014-01-23 | Discharge: 2014-01-23 | Disposition: A | Discharge status: Home / Self Care | Payer: Medicare Other | Source: Ambulatory Visit | Attending: Orthopedic Surgery | Admitting: Orthopedic Surgery

## 2014-01-23 ENCOUNTER — Encounter (HOSPITAL_COMMUNITY): Payer: Self-pay

## 2014-01-23 DIAGNOSIS — Z01812 Encounter for preprocedural laboratory examination: Secondary | ICD-10-CM | POA: Insufficient documentation

## 2014-01-23 HISTORY — DX: Deviated nasal septum: J34.2

## 2014-01-23 HISTORY — DX: Pleurodynia: R07.81

## 2014-01-23 LAB — URINALYSIS, ROUTINE W REFLEX MICROSCOPIC
Glucose, UA: NEGATIVE mg/dL
Hgb urine dipstick: NEGATIVE
Ketones, ur: NEGATIVE mg/dL
Nitrite: NEGATIVE
PROTEIN: NEGATIVE mg/dL
Specific Gravity, Urine: 1.018 (ref 1.005–1.030)
UROBILINOGEN UA: 0.2 mg/dL (ref 0.0–1.0)
pH: 8 (ref 5.0–8.0)

## 2014-01-23 LAB — COMPREHENSIVE METABOLIC PANEL
ALT: 17 U/L (ref 0–35)
AST: 26 U/L (ref 0–37)
Albumin: 3.8 g/dL (ref 3.5–5.2)
Alkaline Phosphatase: 82 U/L (ref 39–117)
BUN: 11 mg/dL (ref 6–23)
CO2: 27 mEq/L (ref 19–32)
Calcium: 9.6 mg/dL (ref 8.4–10.5)
Chloride: 99 mEq/L (ref 96–112)
Creatinine, Ser: 0.75 mg/dL (ref 0.50–1.10)
GFR calc non Af Amer: 82 mL/min — ABNORMAL LOW (ref >90)
GLUCOSE: 92 mg/dL (ref 70–99)
Potassium: 4.6 mEq/L (ref 3.7–5.3)
Sodium: 137 mEq/L (ref 137–147)
TOTAL PROTEIN: 7.1 g/dL (ref 6.0–8.3)
Total Bilirubin: 0.4 mg/dL (ref 0.3–1.2)

## 2014-01-23 LAB — APTT: aPTT: 32 seconds (ref 24–37)

## 2014-01-23 LAB — URINE MICROSCOPIC-ADD ON

## 2014-01-23 LAB — SURGICAL PCR SCREEN
MRSA, PCR: NEGATIVE
Staphylococcus aureus: NEGATIVE

## 2014-01-23 LAB — CBC
HEMATOCRIT: 38.8 % (ref 36.0–46.0)
Hemoglobin: 12.6 g/dL (ref 12.0–15.0)
MCH: 28.4 pg (ref 26.0–34.0)
MCHC: 32.5 g/dL (ref 30.0–36.0)
MCV: 87.6 fL (ref 78.0–100.0)
Platelets: 173 10*3/uL (ref 150–400)
RBC: 4.43 MIL/uL (ref 3.87–5.11)
RDW: 13.8 % (ref 11.5–15.5)
WBC: 4.4 10*3/uL (ref 4.0–10.5)

## 2014-01-23 LAB — PROTIME-INR
INR: 0.94 (ref 0.00–1.49)
PROTHROMBIN TIME: 12.4 s (ref 11.6–15.2)

## 2014-01-23 NOTE — Progress Notes (Signed)
Patient states she reported left rib pain on 01/20/14 to office of Dr Wynelle Link and was seen by MD and Xray done 01/20/14 which is in EPIC.

## 2014-01-23 NOTE — Progress Notes (Addendum)
Clearance Dr Maudie Mercury no chart along with LOV note dated 10/17/13.  Also last office visit note dated 01/20/14 in [place].

## 2014-01-24 ENCOUNTER — Other Ambulatory Visit: Payer: Self-pay | Admitting: Surgical

## 2014-01-24 NOTE — H&P (Signed)
TOTAL KNEE ADMISSION H&P  Patient is being admitted for left total knee arthroplasty.  Subjective:  Chief Complaint:left knee pain.  HPI: Tammy [name], 73 y.o. female, has a history of pain and functional disability in the left knee due to arthritis and has failed non-surgical conservative treatments for greater than 12 weeks to includeNSAID's and/or analgesics, corticosteriod injections, flexibility and strengthening excercises and activity modification.  Onset of symptoms was gradual, starting 5 years ago with gradually worsening course since that time. The patient noted no past surgery on the left knee(s).  Patient currently rates pain in the left knee(s) at 5 out of 10 with activity. Patient has night pain, worsening of pain with activity and weight bearing, pain that interferes with activities of daily living, pain with passive range of motion, crepitus and joint swelling.  Patient has evidence of periarticular osteophytes and joint space narrowing by imaging studies. There is no active infection.  Patient Active Problem List   Diagnosis Date Noted  . Knee pain, right 10/10/2013  . Constipation 08/12/2013  . Postoperative anemia due to acute blood loss 07/26/2013  . Hyponatremia 07/26/2013  . OA (osteoarthritis) of knee 07/25/2013  . Internal hemorrhoids with Grade 2 prolapse and bleeding 05/27/2013  . Soft tissue mass 04/05/2013  . Essential hypertension, benign 03/22/2013  . GERD (gastroesophageal reflux disease) 03/22/2013  . Chronic back pain 03/22/2013  . Prediabetes 03/22/2013  . Hyperlipidemia, mild 03/22/2013  . Obesity (BMI 30-39.9) 03/22/2013   Past Medical History  Diagnosis Date  . Chicken pox   . Varicose vein   . Depression     mild, never on medication  . GERD (gastroesophageal reflux disease)   . Heart murmur     told in 2012 benign  . Hyperglycemia     borderline  . Anxiety   . Arthritis     knees and back  . High cholesterol     borderline, no  meds  . Hypertension     high blood pressure readings, no meds  . DDD (degenerative disc disease), lumbar   . Scoliosis   . Osteoarthritis   . Hemorrhoids   . Diabetes mellitus without complication     borderline , diet controlled   . Deviated septum   . Rib pain on left side 01/20/2014     xray done seen by MD 01/20/14 in [facility]     Past Surgical History  Procedure Laterality Date  . Tonsillectomy and adenoidectomy  1973  . Abdominal hysterectomy  1972  . Incision and drainage breast abscess Bilateral 1966, 1967  . Ovarian cyst surgery Bilateral 1991  . Tubal ligation  1970  . Dental surgery  2007    left jaw bone graft, 2 implants on left, 2 implants on right  . Hemorroidectomy  1975  . Mass excision N/A 04/22/2013    Procedure: EXCISION MULTPILE soft tissue masses and abnormal skin lesion left thigh /left chest/ left posterior shoulder/ right flank ;  Surgeon: Harl Bowie, MD;  Location: [facility];  Service: General;  Laterality: N/A;  multiple sites:  left thigh, left shoulder, right flank, right upper arm  . Ovarian cyst surgery    . Colonoscopy  05/27/2013  . Hemorrhoid banding  06/03/13  . Total knee arthroplasty Right 07/25/2013    Procedure: RIGHT TOTAL KNEE ARTHROPLASTY;  Surgeon: Gearlean Alf, MD;  Location: [facility];  Service: Orthopedics;  Laterality: Right;  Left knee cortisone injection     Current outpatient prescriptions: aspirin  325 MG tablet, Take 325 mg by mouth daily. Patient takes at 6pm daily, Disp: , Rfl: ;   Calcium Carbonate-Vitamin D (CALTRATE 600+D) 600-400 MG-UNIT per tablet, Take 1 tablet by mouth 2 (two) times daily. , Disp: , Rfl: ;   cyclobenzaprine (FLEXERIL) 10 MG tablet, Take 10 mg by mouth 3 (three) times daily as needed for muscle spasms., Disp: , Rfl:  etodolac (LODINE) 400 MG tablet, Take 400 mg by mouth 2 (two) times daily. , Disp: , Rfl: ;   methocarbamol (ROBAXIN) 500 MG tablet, Take 500 mg by mouth 3 (three) times daily as needed for  muscle spasms. , Disp: , Rfl: ;   oxyCODONE-acetaminophen (PERCOCET) 5-325 MG per tablet, Take by mouth every 4 (four) hours as needed for severe pain., Disp: , Rfl: ;   pantoprazole (PROTONIX) 40 MG tablet, Take 40 mg by mouth every morning., Disp: , Rfl:  traMADol (ULTRAM) 50 MG tablet, Take 50 mg by mouth 3 (three) times daily as needed., Disp: , Rfl:   Allergies  Allergen Reactions  . Hydrocodone     Made her feel very strange, wired, restless   . Penicillins Rash    History  Substance Use Topics  . Smoking status: Never Smoker   . Smokeless tobacco: Never Used  . Alcohol Use: No     Comment: rare    Family History  Problem Relation Age of Onset  . Alcohol abuse Father   . Heart disease Father   . Arthritis Mother   . Stroke Mother   . Hypertension Mother   . Diabetes Mother   . Breast cancer Maternal Grandmother   . Breast cancer Maternal Aunt   . Colon cancer Maternal Uncle   . Esophageal cancer Neg Hx   . Stomach cancer Neg Hx   . Rectal cancer Neg Hx      Review of Systems  Constitutional: Positive for malaise/fatigue. Negative for fever, chills, weight loss and diaphoresis.  HENT: Positive for hearing loss and tinnitus. Negative for congestion, ear discharge, ear pain, nosebleeds and sore throat.   Eyes: Negative.   Respiratory: Negative.  Negative for stridor.   Cardiovascular: Negative.   Gastrointestinal: Positive for heartburn and nausea. Negative for vomiting, abdominal pain, diarrhea, constipation, blood in stool and melena.  Genitourinary: Positive for frequency. Negative for dysuria, urgency, hematuria and flank pain.  Musculoskeletal: Positive for back pain and joint pain. Negative for falls, myalgias and neck pain.       Left knee pain  Skin: Negative.   Neurological: Positive for dizziness and headaches. Negative for tingling, tremors, sensory change, speech change, focal weakness, seizures, loss of consciousness and weakness.  Endo/Heme/Allergies:  Negative.   Psychiatric/Behavioral: Positive for memory loss. Negative for depression, suicidal ideas, hallucinations and substance abuse. The patient has insomnia. The patient is not nervous/anxious.     Objective:  Physical Exam  Constitutional: She is oriented to person, place, and time. She appears well-developed and well-nourished. No distress.  HENT:  Head: Normocephalic and atraumatic.  Right Ear: External ear normal.  Left Ear: External ear normal.  Nose: Nose normal.  Mouth/Throat: Oropharynx is clear and moist.  Eyes: Conjunctivae and EOM are normal.  Neck: Normal range of motion. Neck supple.  Cardiovascular: Normal rate, regular rhythm and intact distal pulses.   Murmur heard.  Systolic murmur is present with a grade of 3/6  Respiratory: Effort normal and breath sounds normal. No respiratory distress. She has no wheezes.  GI: Soft. Bowel sounds  are normal. She exhibits no distension. There is no tenderness.  Musculoskeletal:       Right hip: Normal.       Left hip: Normal.       Right knee: Normal.       Left knee: She exhibits decreased range of motion and swelling. She exhibits no effusion and no erythema. Tenderness found. Medial joint line and lateral joint line tenderness noted.       Right lower leg: She exhibits no tenderness and no swelling.       Left lower leg: She exhibits no tenderness and no swelling.  Right knee: Range 0 to 130 with no tenderness or instability. Left knee: No effusion. Range of motion of the left is about 5 to 120 with marked crepitus and range of motion. Tenderness medially greater then laterally, with no instability. She has got a varus deformity on the left.  Neurological: She is alert and oriented to person, place, and time. She has normal strength and normal reflexes. No sensory deficit.  Skin: No rash noted. She is not diaphoretic. No erythema.  Psychiatric: She has a normal mood and affect. Her behavior is normal.    Vital signs in  last 24 hours: Temp:  [97.6 F (36.4 C)] 97.6 F (36.4 C) (06/01 1431) Pulse Rate:  [70] 70 (06/01 1431) Resp:  [16] 16 (06/01 1431) BP: (175)/(88) 175/88 mmHg (06/01 1431) SpO2:  [97 %] 97 % (06/01 1431) Weight:  [72.576 kg (160 lb)] 72.576 kg (160 lb) (06/01 1431)  Labs:   Estimated body mass index is 31.64 kg/(m^2) as calculated from the following:   Height as of 07/25/13: 5' (1.524 m).   Weight as of 07/13/13: 73.483 kg (162 lb).   Imaging Review Plain radiographs demonstrate severe degenerative joint disease of the left knee(s). The overall alignment ismild varus. The bone quality appears to be adequate for age and reported activity level.  Assessment/Plan:  End stage arthritis, left knee   The patient history, physical examination, clinical judgment of the provider and imaging studies are consistent with end stage degenerative joint disease of the left knee(s) and total knee arthroplasty is deemed medically necessary. The treatment options including medical management, injection therapy arthroscopy and arthroplasty were discussed at length. The risks and benefits of total knee arthroplasty were presented and reviewed. The risks due to aseptic loosening, infection, stiffness, patella tracking problems, thromboembolic complications and other imponderables were discussed. The patient acknowledged the explanation, agreed to proceed with the plan and consent was signed. Patient is being admitted for inpatient treatment for surgery, pain control, PT, OT, prophylactic antibiotics, VTE prophylaxis, progressive ambulation and ADL's and discharge planning. The patient is planning to be discharged to skilled nursing facility ([facility])     Ardeen Jourdain, [place]

## 2014-01-30 ENCOUNTER — Encounter (HOSPITAL_COMMUNITY): Payer: Self-pay | Admitting: *Deleted

## 2014-01-30 ENCOUNTER — Inpatient Hospital Stay (HOSPITAL_COMMUNITY): Payer: Medicare Other | Admitting: Anesthesiology

## 2014-01-30 ENCOUNTER — Inpatient Hospital Stay (HOSPITAL_COMMUNITY)
Admission: RE | Admit: 2014-01-30 | Discharge: 2014-02-01 | DRG: 470 | Disposition: A | Discharge status: Skilled Nursing Facility | Payer: Medicare Other | Source: Ambulatory Visit | Attending: Orthopedic Surgery | Admitting: Orthopedic Surgery

## 2014-01-30 ENCOUNTER — Encounter (HOSPITAL_COMMUNITY): Payer: Medicare Other | Admitting: Anesthesiology

## 2014-01-30 ENCOUNTER — Encounter (HOSPITAL_COMMUNITY)
Admission: RE | Disposition: A | Discharge status: Skilled Nursing Facility | Payer: Self-pay | Source: Ambulatory Visit | Attending: Orthopedic Surgery

## 2014-01-30 DIAGNOSIS — M1710 Unilateral primary osteoarthritis, unspecified knee: Principal | ICD-10-CM | POA: Diagnosis present

## 2014-01-30 DIAGNOSIS — Z6831 Body mass index (BMI) 31.0-31.9, adult: Secondary | ICD-10-CM

## 2014-01-30 DIAGNOSIS — I10 Essential (primary) hypertension: Secondary | ICD-10-CM | POA: Diagnosis present

## 2014-01-30 DIAGNOSIS — Z7982 Long term (current) use of aspirin: Secondary | ICD-10-CM

## 2014-01-30 DIAGNOSIS — K219 Gastro-esophageal reflux disease without esophagitis: Secondary | ICD-10-CM | POA: Diagnosis present

## 2014-01-30 DIAGNOSIS — E669 Obesity, unspecified: Secondary | ICD-10-CM | POA: Diagnosis present

## 2014-01-30 DIAGNOSIS — Z79899 Other long term (current) drug therapy: Secondary | ICD-10-CM

## 2014-01-30 DIAGNOSIS — D62 Acute posthemorrhagic anemia: Secondary | ICD-10-CM | POA: Diagnosis not present

## 2014-01-30 DIAGNOSIS — E119 Type 2 diabetes mellitus without complications: Secondary | ICD-10-CM | POA: Diagnosis present

## 2014-01-30 DIAGNOSIS — M179 Osteoarthritis of knee, unspecified: Secondary | ICD-10-CM | POA: Diagnosis present

## 2014-01-30 DIAGNOSIS — Z96659 Presence of unspecified artificial knee joint: Secondary | ICD-10-CM

## 2014-01-30 DIAGNOSIS — E785 Hyperlipidemia, unspecified: Secondary | ICD-10-CM | POA: Diagnosis present

## 2014-01-30 DIAGNOSIS — Z01812 Encounter for preprocedural laboratory examination: Secondary | ICD-10-CM

## 2014-01-30 HISTORY — DX: Contusion of unspecified front wall of thorax, initial encounter: S20.219A

## 2014-01-30 HISTORY — PX: TOTAL KNEE ARTHROPLASTY: SHX125

## 2014-01-30 LAB — GLUCOSE, CAPILLARY
GLUCOSE-CAPILLARY: 107 mg/dL — AB (ref 70–99)
Glucose-Capillary: 102 mg/dL — ABNORMAL HIGH (ref 70–99)
Glucose-Capillary: 198 mg/dL — ABNORMAL HIGH (ref 70–99)
Glucose-Capillary: 152 mg/dL — ABNORMAL HIGH (ref 70–99)

## 2014-01-30 LAB — TYPE AND SCREEN
ABO/RH(D): A POS
Antibody Screen: NEGATIVE

## 2014-01-30 SURGERY — ARTHROPLASTY, KNEE, TOTAL
Anesthesia: Spinal | Site: Knee | Laterality: Left

## 2014-01-30 MED ORDER — METOCLOPRAMIDE HCL 10 MG PO TABS: 5.0000 mg | ORAL_TABLET | Freq: Three times a day (TID) | ORAL | Status: DC | PRN

## 2014-01-30 MED ORDER — TRAMADOL HCL 50 MG PO TABS: 50.0000 mg | ORAL_TABLET | Freq: Four times a day (QID) | ORAL | Status: DC | PRN

## 2014-01-30 MED ORDER — METHOCARBAMOL 1000 MG/10ML IJ SOLN
500.0000 mg | Freq: Four times a day (QID) | INTRAMUSCULAR | Status: DC | PRN
  Administered 2014-01-30: 500 mg via INTRAVENOUS
  Filled 2014-01-30: qty 5

## 2014-01-30 MED ORDER — BUPIVACAINE IN DEXTROSE 0.75-8.25 % IT SOLN
INTRATHECAL | Status: DC | PRN
  Administered 2014-01-30: 1.6 mL via INTRATHECAL

## 2014-01-30 MED ORDER — MIDAZOLAM HCL 2 MG/2ML IJ SOLN
INTRAMUSCULAR | Status: AC
  Filled 2014-01-30: qty 2

## 2014-01-30 MED ORDER — FENTANYL CITRATE 0.05 MG/ML IJ SOLN
INTRAMUSCULAR | Status: AC
  Filled 2014-01-30: qty 5

## 2014-01-30 MED ORDER — KETOROLAC TROMETHAMINE 30 MG/ML IJ SOLN
INTRAMUSCULAR | Status: AC
Start: 2014-01-30 — End: 2014-01-31
  Filled 2014-01-30: qty 1

## 2014-01-30 MED ORDER — ACETAMINOPHEN 650 MG RE SUPP: 650.0000 mg | Freq: Four times a day (QID) | RECTAL | Status: DC | PRN

## 2014-01-30 MED ORDER — LACTATED RINGERS IV SOLN
INTRAVENOUS | Status: DC
  Administered 2014-01-30: 1000 mL via INTRAVENOUS

## 2014-01-30 MED ORDER — METOCLOPRAMIDE HCL 5 MG/ML IJ SOLN: 5.0000 mg | Freq: Three times a day (TID) | INTRAMUSCULAR | Status: DC | PRN

## 2014-01-30 MED ORDER — LIDOCAINE HCL (CARDIAC) 20 MG/ML IV SOLN
INTRAVENOUS | Status: AC
  Filled 2014-01-30: qty 5

## 2014-01-30 MED ORDER — DEXAMETHASONE SODIUM PHOSPHATE 10 MG/ML IJ SOLN
10.0000 mg | Freq: Once | INTRAMUSCULAR | Status: AC
  Administered 2014-01-30: 10 mg via INTRAVENOUS

## 2014-01-30 MED ORDER — OXYCODONE HCL 5 MG PO TABS
5.0000 mg | ORAL_TABLET | ORAL | Status: DC | PRN
  Administered 2014-01-30 – 2014-02-01 (×13): 10 mg via ORAL
  Filled 2014-01-30 (×13): qty 2

## 2014-01-30 MED ORDER — MORPHINE SULFATE 2 MG/ML IJ SOLN
1.0000 mg | INTRAMUSCULAR | Status: DC | PRN
  Administered 2014-01-30 – 2014-01-31 (×2): 2 mg via INTRAVENOUS
  Administered 2014-01-31: 1 mg via INTRAVENOUS
  Filled 2014-01-30 (×3): qty 1

## 2014-01-30 MED ORDER — LACTATED RINGERS IV SOLN
INTRAVENOUS | Status: DC | PRN
Start: 2014-01-30 — End: 2014-01-30
  Administered 2014-01-30 (×2): via INTRAVENOUS

## 2014-01-30 MED ORDER — MIDAZOLAM HCL 5 MG/5ML IJ SOLN
INTRAMUSCULAR | Status: DC | PRN
  Administered 2014-01-30 (×2): 1 mg via INTRAVENOUS

## 2014-01-30 MED ORDER — SODIUM CHLORIDE 0.9 % IV SOLN: INTRAVENOUS | Status: DC

## 2014-01-30 MED ORDER — ONDANSETRON HCL 4 MG PO TABS: 4.0000 mg | ORAL_TABLET | Freq: Four times a day (QID) | ORAL | Status: DC | PRN

## 2014-01-30 MED ORDER — ACETAMINOPHEN 500 MG PO TABS
1000.0000 mg | ORAL_TABLET | Freq: Four times a day (QID) | ORAL | Status: AC
  Administered 2014-01-30 – 2014-01-31 (×4): 1000 mg via ORAL
  Filled 2014-01-30 (×4): qty 2

## 2014-01-30 MED ORDER — SODIUM CHLORIDE 0.9 % IJ SOLN
INTRAMUSCULAR | Status: AC
Start: 2014-01-30 — End: 2014-01-30
  Filled 2014-01-30: qty 50

## 2014-01-30 MED ORDER — CEFAZOLIN SODIUM-DEXTROSE 2-3 GM-% IV SOLR
2.0000 g | INTRAVENOUS | Status: AC
  Administered 2014-01-30: 2 g via INTRAVENOUS

## 2014-01-30 MED ORDER — DEXAMETHASONE SODIUM PHOSPHATE 10 MG/ML IJ SOLN
10.0000 mg | Freq: Every day | INTRAMUSCULAR | Status: AC
  Filled 2014-01-30: qty 1

## 2014-01-30 MED ORDER — CYCLOBENZAPRINE HCL 10 MG PO TABS
10.0000 mg | ORAL_TABLET | Freq: Every day | ORAL | Status: DC
  Administered 2014-01-30 – 2014-01-31 (×2): 10 mg via ORAL
  Filled 2014-01-30 (×3): qty 1

## 2014-01-30 MED ORDER — PROPOFOL 10 MG/ML IV BOLUS
INTRAVENOUS | Status: AC
  Filled 2014-01-30: qty 20

## 2014-01-30 MED ORDER — CEFAZOLIN SODIUM-DEXTROSE 2-3 GM-% IV SOLR
2.0000 g | Freq: Four times a day (QID) | INTRAVENOUS | Status: AC
  Administered 2014-01-30 – 2014-01-31 (×2): 2 g via INTRAVENOUS
  Filled 2014-01-30 (×2): qty 50

## 2014-01-30 MED ORDER — KETOROLAC TROMETHAMINE 30 MG/ML IJ SOLN
15.0000 mg | Freq: Once | INTRAMUSCULAR | Status: AC | PRN
  Administered 2014-01-30: 30 mg via INTRAVENOUS

## 2014-01-30 MED ORDER — PROPOFOL INFUSION 10 MG/ML OPTIME
INTRAVENOUS | Status: DC | PRN
  Administered 2014-01-30: 75 ug/kg/min via INTRAVENOUS

## 2014-01-30 MED ORDER — METHOCARBAMOL 500 MG PO TABS
500.0000 mg | ORAL_TABLET | Freq: Four times a day (QID) | ORAL | Status: DC | PRN
Start: 2014-01-30 — End: 2014-02-01
  Administered 2014-01-31 – 2014-02-01 (×5): 500 mg via ORAL
  Filled 2014-01-30 (×6): qty 1

## 2014-01-30 MED ORDER — BUPIVACAINE HCL (PF) 0.25 % IJ SOLN
INTRAMUSCULAR | Status: AC
  Filled 2014-01-30: qty 30

## 2014-01-30 MED ORDER — KETOROLAC TROMETHAMINE 15 MG/ML IJ SOLN: 7.5000 mg | Freq: Four times a day (QID) | INTRAMUSCULAR | Status: AC | PRN

## 2014-01-30 MED ORDER — DEXAMETHASONE 4 MG PO TABS
10.0000 mg | ORAL_TABLET | Freq: Every day | ORAL | Status: AC
  Administered 2014-01-31: 10 mg via ORAL
  Filled 2014-01-30: qty 1

## 2014-01-30 MED ORDER — SODIUM CHLORIDE 0.9 % IJ SOLN
INTRAMUSCULAR | Status: DC | PRN
  Administered 2014-01-30: 30 mL

## 2014-01-30 MED ORDER — FLEET ENEMA 7-19 GM/118ML RE ENEM: 1.0000 | ENEMA | Freq: Once | RECTAL | Status: AC | PRN

## 2014-01-30 MED ORDER — CEFAZOLIN SODIUM-DEXTROSE 2-3 GM-% IV SOLR
INTRAVENOUS | Status: AC
  Filled 2014-01-30: qty 50

## 2014-01-30 MED ORDER — DOCUSATE SODIUM 100 MG PO CAPS
100.0000 mg | ORAL_CAPSULE | Freq: Two times a day (BID) | ORAL | Status: DC
  Administered 2014-01-30 – 2014-02-01 (×4): 100 mg via ORAL

## 2014-01-30 MED ORDER — CHLORHEXIDINE GLUCONATE 4 % EX LIQD: 60.0000 mL | Freq: Once | CUTANEOUS | Status: DC

## 2014-01-30 MED ORDER — BUPIVACAINE LIPOSOME 1.3 % IJ SUSP
INTRAMUSCULAR | Status: DC | PRN
  Administered 2014-01-30: 20 mL

## 2014-01-30 MED ORDER — DEXTROSE-NACL 5-0.9 % IV SOLN
INTRAVENOUS | Status: DC
  Administered 2014-01-30: 19:00:00 via INTRAVENOUS

## 2014-01-30 MED ORDER — BUPIVACAINE HCL 0.25 % IJ SOLN
INTRAMUSCULAR | Status: DC | PRN
  Administered 2014-01-30: 20 mL

## 2014-01-30 MED ORDER — PROPOFOL 10 MG/ML IV EMUL
INTRAVENOUS | Status: DC | PRN
  Administered 2014-01-30: 20 mg via INTRAVENOUS

## 2014-01-30 MED ORDER — ACETAMINOPHEN 10 MG/ML IV SOLN
1000.0000 mg | Freq: Once | INTRAVENOUS | Status: AC
  Administered 2014-01-30: 1000 mg via INTRAVENOUS
  Filled 2014-01-30: qty 100

## 2014-01-30 MED ORDER — SODIUM CHLORIDE 0.9 % IR SOLN
Status: DC | PRN
  Administered 2014-01-30: 1000 mL

## 2014-01-30 MED ORDER — ONDANSETRON HCL 4 MG/2ML IJ SOLN
INTRAMUSCULAR | Status: AC
  Filled 2014-01-30: qty 2

## 2014-01-30 MED ORDER — PANTOPRAZOLE SODIUM 40 MG PO TBEC
40.0000 mg | DELAYED_RELEASE_TABLET | Freq: Every morning | ORAL | Status: DC
  Administered 2014-01-31 – 2014-02-01 (×2): 40 mg via ORAL
  Filled 2014-01-30 (×5): qty 1

## 2014-01-30 MED ORDER — ACETAMINOPHEN 325 MG PO TABS
650.0000 mg | ORAL_TABLET | Freq: Four times a day (QID) | ORAL | Status: DC | PRN
  Administered 2014-02-01: 650 mg via ORAL
  Filled 2014-01-30: qty 2

## 2014-01-30 MED ORDER — PHENOL 1.4 % MT LIQD
1.0000 | OROMUCOSAL | Status: DC | PRN
  Filled 2014-01-30: qty 177

## 2014-01-30 MED ORDER — TRANEXAMIC ACID 100 MG/ML IV SOLN
1000.0000 mg | INTRAVENOUS | Status: AC
  Administered 2014-01-30: 1000 mg via INTRAVENOUS
  Filled 2014-01-30: qty 10

## 2014-01-30 MED ORDER — BISACODYL 10 MG RE SUPP: 10.0000 mg | Freq: Every day | RECTAL | Status: DC | PRN

## 2014-01-30 MED ORDER — RIVAROXABAN 10 MG PO TABS
10.0000 mg | ORAL_TABLET | Freq: Every day | ORAL | Status: DC
  Administered 2014-01-31 – 2014-02-01 (×2): 10 mg via ORAL
  Filled 2014-01-30 (×3): qty 1

## 2014-01-30 MED ORDER — ONDANSETRON HCL 4 MG/2ML IJ SOLN: 4.0000 mg | Freq: Four times a day (QID) | INTRAMUSCULAR | Status: DC | PRN

## 2014-01-30 MED ORDER — FENTANYL CITRATE 0.05 MG/ML IJ SOLN: 25.0000 ug | INTRAMUSCULAR | Status: DC | PRN

## 2014-01-30 MED ORDER — PROMETHAZINE HCL 25 MG/ML IJ SOLN
6.2500 mg | INTRAMUSCULAR | Status: DC | PRN
Start: 2014-01-30 — End: 2014-01-30

## 2014-01-30 MED ORDER — BUPIVACAINE LIPOSOME 1.3 % IJ SUSP
20.0000 mL | Freq: Once | INTRAMUSCULAR | Status: DC
  Filled 2014-01-30: qty 20

## 2014-01-30 MED ORDER — POLYETHYLENE GLYCOL 3350 17 G PO PACK
17.0000 g | PACK | Freq: Every day | ORAL | Status: DC | PRN
  Administered 2014-01-31: 17 g via ORAL

## 2014-01-30 MED ORDER — 0.9 % SODIUM CHLORIDE (POUR BTL) OPTIME
TOPICAL | Status: DC | PRN
  Administered 2014-01-30: 1000 mL

## 2014-01-30 MED ORDER — MENTHOL 3 MG MT LOZG
1.0000 | LOZENGE | OROMUCOSAL | Status: DC | PRN
  Filled 2014-01-30: qty 9

## 2014-01-30 MED ORDER — DIPHENHYDRAMINE HCL 12.5 MG/5ML PO ELIX: 12.5000 mg | ORAL_SOLUTION | ORAL | Status: DC | PRN

## 2014-01-30 SURGICAL SUPPLY — 56 items
BAG ZIPLOCK 12X15 (MISCELLANEOUS) IMPLANT
BANDAGE ELASTIC 6 VELCRO ST LF (GAUZE/BANDAGES/DRESSINGS) ×2 IMPLANT
BANDAGE ESMARK 6X9 LF (GAUZE/BANDAGES/DRESSINGS) ×1 IMPLANT
BLADE SAG 18X100X1.27 (BLADE) ×2 IMPLANT
BLADE SAW SGTL 11.0X1.19X90.0M (BLADE) ×2 IMPLANT
BNDG ESMARK 6X9 LF (GAUZE/BANDAGES/DRESSINGS) ×2
BOWL SMART MIX CTS (DISPOSABLE) ×2 IMPLANT
CAPT RP KNEE ×2 IMPLANT
CEMENT HV SMART SET (Cement) ×4 IMPLANT
CUFF TOURN SGL QUICK 34 (TOURNIQUET CUFF) ×1
CUFF TRNQT CYL 34X4X40X1 (TOURNIQUET CUFF) ×1 IMPLANT
DECANTER SPIKE VIAL GLASS SM (MISCELLANEOUS) ×2 IMPLANT
DRAPE EXTREMITY T 121X128X90 (DRAPE) ×2 IMPLANT
DRAPE POUCH INSTRU U-SHP 10X18 (DRAPES) ×2 IMPLANT
DRAPE U-SHAPE 47X51 STRL (DRAPES) ×2 IMPLANT
DRSG ADAPTIC 3X8 NADH LF (GAUZE/BANDAGES/DRESSINGS) ×2 IMPLANT
DRSG PAD ABDOMINAL 8X10 ST (GAUZE/BANDAGES/DRESSINGS) ×2 IMPLANT
DURAPREP 26ML APPLICATOR (WOUND CARE) ×2 IMPLANT
ELECT REM PT RETURN 9FT ADLT (ELECTROSURGICAL) ×2
ELECTRODE REM PT RTRN 9FT ADLT (ELECTROSURGICAL) ×1 IMPLANT
EVACUATOR 1/8 PVC DRAIN (DRAIN) ×2 IMPLANT
FACESHIELD WRAPAROUND (MASK) ×10 IMPLANT
GLOVE BIO SURGEON STRL SZ8 (GLOVE) ×2 IMPLANT
GLOVE BIOGEL PI IND STRL 6.5 (GLOVE) ×1 IMPLANT
GLOVE BIOGEL PI IND STRL 8 (GLOVE) ×2 IMPLANT
GLOVE BIOGEL PI INDICATOR 6.5 (GLOVE) ×1
GLOVE BIOGEL PI INDICATOR 8 (GLOVE) ×2
GLOVE ECLIPSE 8.0 STRL XLNG CF (GLOVE) ×2 IMPLANT
GLOVE SURG SS PI 7.0 STRL IVOR (GLOVE) ×4 IMPLANT
GOWN STRL REUS W/TWL LRG LVL3 (GOWN DISPOSABLE) ×4 IMPLANT
GOWN STRL REUS W/TWL XL LVL3 (GOWN DISPOSABLE) ×4 IMPLANT
HANDPIECE INTERPULSE COAX TIP (DISPOSABLE) ×1
IMMOBILIZER KNEE 20 (SOFTGOODS) ×4 IMPLANT
IMMOBILIZER KNEE 20 THIGH 36 (SOFTGOODS) ×1 IMPLANT
KIT BASIN OR (CUSTOM PROCEDURE TRAY) ×2 IMPLANT
MANIFOLD NEPTUNE II (INSTRUMENTS) ×2 IMPLANT
NDL SAFETY ECLIPSE 18X1.5 (NEEDLE) ×2 IMPLANT
NEEDLE HYPO 18GX1.5 SHARP (NEEDLE) ×2
PACK TOTAL JOINT (CUSTOM PROCEDURE TRAY) ×2 IMPLANT
PADDING CAST COTTON 6X4 STRL (CAST SUPPLIES) ×6 IMPLANT
POSITIONER SURGICAL ARM (MISCELLANEOUS) ×2 IMPLANT
SET HNDPC FAN SPRY TIP SCT (DISPOSABLE) ×1 IMPLANT
SPONGE GAUZE 4X4 12PLY (GAUZE/BANDAGES/DRESSINGS) ×2 IMPLANT
STRIP CLOSURE SKIN 1/2X4 (GAUZE/BANDAGES/DRESSINGS) ×4 IMPLANT
SUCTION FRAZIER 12FR DISP (SUCTIONS) ×2 IMPLANT
SUT MNCRL AB 4-0 PS2 18 (SUTURE) ×2 IMPLANT
SUT VIC AB 2-0 CT1 27 (SUTURE) ×3
SUT VIC AB 2-0 CT1 TAPERPNT 27 (SUTURE) ×3 IMPLANT
SUT VLOC 180 0 24IN GS25 (SUTURE) ×2 IMPLANT
SYR 20CC LL (SYRINGE) ×2 IMPLANT
SYR 50ML LL SCALE MARK (SYRINGE) ×2 IMPLANT
TOWEL OR 17X26 10 PK STRL BLUE (TOWEL DISPOSABLE) ×2 IMPLANT
TOWEL OR NON WOVEN STRL DISP B (DISPOSABLE) ×2 IMPLANT
TRAY FOLEY CATH 14FRSI W/METER (CATHETERS) ×2 IMPLANT
WATER STERILE IRR 1500ML POUR (IV SOLUTION) ×2 IMPLANT
WRAP KNEE MAXI GEL POST OP (GAUZE/BANDAGES/DRESSINGS) ×2 IMPLANT

## 2014-01-30 NOTE — Anesthesia Preprocedure Evaluation (Addendum)
Anesthesia Evaluation  Patient identified by MRN, date of birth, ID band Patient awake    Reviewed: Allergy & Precautions, H&P , NPO status , Patient's Chart, lab work & pertinent test results  Airway Mallampati: II TM Distance: >3 FB Neck ROM: Full    Dental no notable dental hx.    Pulmonary neg pulmonary ROS,  breath sounds clear to auscultation  Pulmonary exam normal       Cardiovascular hypertension, Rhythm:Regular Rate:Normal  Hypertension   high blood pressure readings, no meds    Neuro/Psych negative neurological ROS  negative psych ROS   GI/Hepatic negative GI ROS, Neg liver ROS,   Endo/Other  diabetes  Renal/GU negative Renal ROS  negative genitourinary   Musculoskeletal negative musculoskeletal ROS (+)   Abdominal   Peds negative pediatric ROS (+)  Hematology negative hematology ROS (+)   Anesthesia Other Findings   Reproductive/Obstetrics negative OB ROS                         Anesthesia Physical Anesthesia Plan  ASA: II  Anesthesia Plan: Spinal   Post-op Pain Management:    Induction:   Airway Management Planned: Simple Face Mask  Additional Equipment:   Intra-op Plan:   Post-operative Plan:   Informed Consent: I have reviewed the patients History and Physical, chart, labs and discussed the procedure including the risks, benefits and alternatives for the proposed anesthesia with the patient or authorized representative who has indicated his/her understanding and acceptance.   Dental advisory given  Plan Discussed with: CRNA and Surgeon  Anesthesia Plan Comments:        Anesthesia Quick Evaluation

## 2014-01-30 NOTE — Op Note (Signed)
Pre-operative diagnosis- Osteoarthritis  Left knee(s)  Post-operative diagnosis- Osteoarthritis Left knee(s)  Procedure-  Left  Total Knee Arthroplasty  Surgeon- Dione Plover. Malayja Freund, MD  Assistant- Molli Barrows, PA-C   Anesthesia-  Spinal  EBL-* No blood loss amount entered *   Drains Hemovac  Tourniquet time-  Total Tourniquet Time Documented: Thigh (Left) - 32 minutes Total: Thigh (Left) - 32 minutes     Complications- None  Condition-PACU - hemodynamically stable.   Brief Clinical Note  Tammy [name] is a 73 y.o. year old female with end stage OA of her left knee with progressively worsening pain and dysfunction. She has constant pain, with activity and at rest and significant functional deficits with difficulties even with ADLs. She has had extensive non-op management including analgesics, injections of cortisone and viscosupplements, and home exercise program, but remains in significant pain with significant dysfunction. Radiographs show bone on bone arthritis medial and patellofemoral. She presents now for left Total Knee Arthroplasty.    Procedure in detail---   The patient is brought into the operating room and positioned supine on the operating table. After successful administration of  Spinal,   a tourniquet is placed high on the  Left thigh(s) and the lower extremity is prepped and draped in the usual sterile fashion. Time out is performed by the operating team and then the  Left lower extremity is wrapped in Esmarch, knee flexed and the tourniquet inflated to 300 mmHg.       A midline incision is made with a ten blade through the subcutaneous tissue to the level of the extensor mechanism. A fresh blade is used to make a medial parapatellar arthrotomy. Soft tissue over the proximal medial tibia is subperiosteally elevated to the joint line with a knife and into the semimembranosus bursa with a Cobb elevator. Soft tissue over the proximal lateral tibia is elevated with  attention being paid to avoiding the patellar tendon on the tibial tubercle. The patella is everted, knee flexed 90 degrees and the ACL and PCL are removed. Findings are bone on bone medial and patellofemoral  With large global osteophytes.      The drill is used to create a starting hole in the distal femur and the canal is thoroughly irrigated with sterile saline to remove the fatty contents. The 5 degree Left  valgus alignment guide is placed into the femoral canal and the distal femoral cutting block is pinned to remove 10 mm off the distal femur. Resection is made with an oscillating saw.      The tibia is subluxed forward and the menisci are removed. The extramedullary alignment guide is placed referencing proximally at the medial aspect of the tibial tubercle and distally along the second metatarsal axis and tibial crest. The block is pinned to remove 93mm off the more deficient medial  side. Resection is made with an oscillating saw. Size 2.5is the most appropriate size for the tibia and the proximal tibia is prepared with the modular drill and keel punch for that size.      The femoral sizing guide is placed and size 3 is most appropriate. Rotation is marked off the epicondylar axis and confirmed by creating a rectangular flexion gap at 90 degrees. The size 3 cutting block is pinned in this rotation and the anterior, posterior and chamfer cuts are made with the oscillating saw. The intercondylar block is then placed and that cut is made.      Trial size 2.5 tibial component, trial  size 3 posterior stabilized femur and a 15  mm posterior stabilized rotating platform insert trial is placed. Full extension is achieved with excellent varus/valgus and anterior/posterior balance throughout full range of motion. The patella is everted and thickness measured to be 22  mm. Free hand resection is taken to 12 mm, a 35 template is placed, lug holes are drilled, trial patella is placed, and it tracks normally.  Osteophytes are removed off the posterior femur with the trial in place. All trials are removed and the cut bone surfaces prepared with pulsatile lavage. Cement is mixed and once ready for implantation, the size 2.5 tibial implant, size  3 posterior stabilized femoral component, and the size 35 patella are cemented in place and the patella is held with the clamp. The trial insert is placed and the knee held in full extension. The Exparel (20 ml mixed with 30 ml saline) and .25% Bupivicaine, are injected into the extensor mechanism, posterior capsule, medial and lateral gutters and subcutaneous tissues.  All extruded cement is removed and once the cement is hard the permanent 15 mm posterior stabilized rotating platform insert is placed into the tibial tray.      The wound is copiously irrigated with saline solution and the extensor mechanism closed over a hemovac drain with #1 V-loc suture. The tourniquet is released for a total tourniquet time of 32  minutes. Flexion against gravity is 140 degrees and the patella tracks normally. Subcutaneous tissue is closed with 2.0 vicryl and subcuticular with running 4.0 Monocryl. The incision is cleaned and dried and steri-strips and a bulky sterile dressing are applied. The limb is placed into a knee immobilizer and the patient is awakened and transported to recovery in stable condition.      Please note that a surgical assistant was a medical necessity for this procedure in order to perform it in a safe and expeditious manner. Surgical assistant was necessary to retract the ligaments and vital neurovascular structures to prevent injury to them and also necessary for proper positioning of the limb to allow for anatomic placement of the prosthesis.   Dione Plover Cailie Bosshart, MD    01/30/2014, 12:39 PM

## 2014-01-30 NOTE — Interval H&P Note (Signed)
History and Physical Interval Note:  01/30/2014 9:51 AM  Tammy [name]  has presented today for surgery, with the diagnosis of OA LEFT KNEE   The various methods of treatment have been discussed with the patient and family. After consideration of risks, benefits and other options for treatment, the patient has consented to  Procedure(s): LEFT TOTAL KNEE ARTHROPLASTY (Left) as a surgical intervention .  The patient's history has been reviewed, patient examined, no change in status, stable for surgery.  I have reviewed the patient's chart and labs.  Questions were answered to the patient's satisfaction.     Pilar Plate Dustine Stickler V

## 2014-01-30 NOTE — Transfer of Care (Signed)
Immediate Anesthesia Transfer of Care Note  Patient: Tammy [name]  Procedure(s) Performed: Procedure(s): LEFT TOTAL KNEE ARTHROPLASTY (Left)  Patient Location: PACU  Anesthesia Type:Spinal  Level of Consciousness: awake, alert , oriented and patient cooperative  Airway & Oxygen Therapy: Patient Spontanous Breathing and Patient connected to face mask oxygen  Post-op Assessment: Report given to PACU RN and Post -op Vital signs reviewed and stable  Post vital signs: Reviewed and stable  Complications: No apparent anesthesia complications

## 2014-01-30 NOTE — Progress Notes (Signed)
Utilization review completed.  

## 2014-01-30 NOTE — Anesthesia Procedure Notes (Signed)
Spinal  Patient location during procedure: OR Staffing Performed by: anesthesiologist  Preanesthetic Checklist Completed: patient identified, site marked, surgical consent, pre-op evaluation, timeout performed, IV checked, risks and benefits discussed and monitors and equipment checked Spinal Block Patient position: sitting Prep: Betadine Patient monitoring: heart rate, continuous pulse ox and blood pressure Injection technique: single-shot Needle Needle type: Quincke  Needle gauge: 22 G Needle length: 9 cm Additional Notes Expiration date of kit checked and confirmed. Patient tolerated procedure well, without complications.     

## 2014-01-31 LAB — GLUCOSE, CAPILLARY
Glucose-Capillary: 133 mg/dL — ABNORMAL HIGH (ref 70–99)
Glucose-Capillary: 134 mg/dL — ABNORMAL HIGH (ref 70–99)
Glucose-Capillary: 150 mg/dL — ABNORMAL HIGH (ref 70–99)
Glucose-Capillary: 138 mg/dL — ABNORMAL HIGH (ref 70–99)

## 2014-01-31 LAB — CBC
HEMATOCRIT: 27.4 % — AB (ref 36.0–46.0)
Hemoglobin: 9.1 g/dL — ABNORMAL LOW (ref 12.0–15.0)
MCH: 28.6 pg (ref 26.0–34.0)
MCHC: 33.2 g/dL (ref 30.0–36.0)
MCV: 86.2 fL (ref 78.0–100.0)
Platelets: 170 10*3/uL (ref 150–400)
RBC: 3.18 MIL/uL — ABNORMAL LOW (ref 3.87–5.11)
RDW: 13.7 % (ref 11.5–15.5)
WBC: 9.4 10*3/uL (ref 4.0–10.5)

## 2014-01-31 LAB — BASIC METABOLIC PANEL
BUN: 18 mg/dL (ref 6–23)
CO2: 24 mEq/L (ref 19–32)
CREATININE: 0.8 mg/dL (ref 0.50–1.10)
Calcium: 8.8 mg/dL (ref 8.4–10.5)
Chloride: 105 mEq/L (ref 96–112)
GFR calc Af Amer: 83 mL/min — ABNORMAL LOW (ref >90)
GFR, EST NON AFRICAN AMERICAN: 71 mL/min — AB (ref >90)
Glucose, Bld: 150 mg/dL — ABNORMAL HIGH (ref 70–99)
Potassium: 4.3 mEq/L (ref 3.7–5.3)
Sodium: 141 mEq/L (ref 137–147)

## 2014-01-31 MED ORDER — POLYETHYLENE GLYCOL 3350 17 G PO PACK
17.0000 g | PACK | Freq: Every day | ORAL | Status: DC
  Administered 2014-02-01: 17 g via ORAL

## 2014-01-31 MED ORDER — POLYSACCHARIDE IRON COMPLEX 150 MG PO CAPS
150.0000 mg | ORAL_CAPSULE | Freq: Every day | ORAL | Status: DC
  Administered 2014-01-31 – 2014-02-01 (×2): 150 mg via ORAL
  Filled 2014-01-31 (×2): qty 1

## 2014-01-31 MED ORDER — SODIUM CHLORIDE 0.9 % IV SOLN
INTRAVENOUS | Status: DC
  Administered 2014-01-31: 07:00:00 via INTRAVENOUS

## 2014-01-31 MED ORDER — TRAMADOL HCL 50 MG PO TABS: 50.0000 mg | ORAL_TABLET | Freq: Four times a day (QID) | ORAL | Status: DC | PRN

## 2014-01-31 MED ORDER — OXYCODONE HCL 5 MG PO TABS: 5.0000 mg | ORAL_TABLET | ORAL | Status: AC | PRN

## 2014-01-31 MED ORDER — METHOCARBAMOL 500 MG PO TABS: 500.0000 mg | ORAL_TABLET | Freq: Three times a day (TID) | ORAL | Status: DC | PRN

## 2014-01-31 MED ORDER — DSS 100 MG PO CAPS: 100.0000 mg | ORAL_CAPSULE | Freq: Two times a day (BID) | ORAL | Status: DC

## 2014-01-31 MED ORDER — RIVAROXABAN 10 MG PO TABS: 10.0000 mg | ORAL_TABLET | Freq: Every day | ORAL | Status: DC

## 2014-01-31 NOTE — Evaluation (Signed)
Occupational Therapy Evaluation Patient Details Name: Tammy [name] MRN: [id number] DOB: 01-11-1941 Today's Date: 01/31/2014    History of Present Illness Pt is a 73 year old female who underwent L TKA.  She has h/o R TKA in 12/14   Clinical Impression   Pt was admitted for T TKA. She is WBAT.  Pt is independent at baseline and is currently overall min A for adls.  She will benefit from skilled OT.  Goals in acute are for supervision level.  She plans SNF as she lives alone.      Follow Up Recommendations  SNF    Equipment Recommendations  None recommended by OT    Recommendations for Other Services       Precautions / Restrictions Precautions Precautions: Knee Restrictions Weight Bearing Restrictions: No      Mobility Bed Mobility                  Transfers Overall transfer level: Needs assistance Equipment used: Rolling walker (2 wheeled) Transfers: Sit to/from Stand Sit to Stand: Min assist         General transfer comment: cues to extend LLE before sitting    Balance                                            ADL Overall ADL's : Needs assistance/impaired     Grooming: Supervision/safety;Wash/dry face;Standing       Lower Body Bathing: Minimal assistance;Sit to/from stand       Lower Body Dressing: Minimal assistance;Sit to/from stand   Toilet Transfer: Minimal assistance;Comfort height toilet;RW;Ambulation;Grab bars.  Performed hygiene sitting at set up level (washcloths)             General ADL Comments: ambulated to bathroom with min guard:  min A to get up from comfort height commode.  Pt donned underwear with min guard, standing from recliner.       Vision                     Perception     Praxis      Pertinent Vitals/Pain L knee sore; repositioned with ice     Hand Dominance     Extremity/Trunk Assessment Upper Extremity Assessment Upper Extremity Assessment: Overall WFL for tasks  assessed           Communication Communication Communication: No difficulties   Cognition Arousal/Alertness: Awake/alert Behavior During Therapy: WFL for tasks assessed/performed Overall Cognitive Status: Within Functional Limits for tasks assessed                     General Comments       Exercises       Shoulder Instructions      Home Living Family/patient expects to be discharged to:: Skilled nursing facility Living Arrangements: Alone                               Additional Comments: has all DME.  Has a tub/shower with seat      Prior Functioning/Environment Level of Independence: Independent             OT Diagnosis: Generalized weakness   OT Problem List: Decreased strength;Decreased activity tolerance;Decreased knowledge of use of DME or AE;Pain   OT Treatment/Interventions: Self-care/ADL training;DME and/or AE  instruction;Patient/family education    OT Goals(Current goals can be found in the care plan section) Acute Rehab OT Goals Patient Stated Goal: lose some more weight OT Goal Formulation: With patient Time For Goal Achievement: 02/07/14 Potential to Achieve Goals: Good ADL Goals Pt Will Perform Lower Body Dressing: with supervision;with adaptive equipment;sit to/from stand Pt Will Transfer to Toilet: with supervision;ambulating;bedside commode  OT Frequency: Min 2X/week   Barriers to D/C:            Co-evaluation              End of Session CPM Left Knee CPM Left Knee: Off  Activity Tolerance: Patient tolerated treatment well Patient left: in chair;with call bell/phone within reach   Time: 2060-1561 OT Time Calculation (min): 21 min Charges:  OT General Charges $OT Visit: 1 Procedure OT Evaluation $Initial OT Evaluation Tier I: 1 Procedure OT Treatments $Self Care/Home Management : 8-22 mins G-Codes:    Lesle Chris 2014-02-22, 9:32 AM  Lesle Chris, OTR/L [phone] 22-Feb-2014

## 2014-01-31 NOTE — Progress Notes (Signed)
Clinical Social Work Department BRIEF PSYCHOSOCIAL ASSESSMENT 01/31/2014  Patient:  Tammy [name], Tammy [name]     Account Number:  [id number]     Jeannette date:  01/30/2014  Clinical Social Worker:  Renold Genta  Date/Time:  01/31/2014 03:35 PM  Referred by:  Physician  Date Referred:  01/31/2014 Referred for  SNF Placement   Other Referral:   Interview type:  Patient Other interview type:    PSYCHOSOCIAL DATA Living Status:  ALONE Admitted from facility:   Level of care:   Primary support name:  Wayland Salinas (daughter) ph#: [phone] c#: [phone] Primary support relationship to patient:  CHILD, ADULT Degree of support available:   good    CURRENT CONCERNS Current Concerns  Post-Acute Placement   Other Concerns:    SOCIAL WORK ASSESSMENT / PLAN CSW received consult that patient is interested in SNF.   Assessment/plan status:  Information/Referral to [employer] Other assessment/ plan:   Information/referral to community resources:   CSW completed FL2 and faxed information to [facility], per patient she is pre-registered to go there.    PATIENT'S/FAMILY'S RESPONSE TO PLAN OF CARE: Patient informed CSW that she had been to [facility] back in [place] for her right knee replacement, and now she's back for her left knee replacement. She states she has [name] better outlook on her rehab stay since she's been through it all before. CSW confirmed with [facility] that they will have [name] bed for patient tomorrow & Hoyle Sauer will complete admission paperwork with patient tomorrow morning.       Raynaldo Opitz, [facility] Clinical Social Worker cell #: [phone]

## 2014-01-31 NOTE — Anesthesia Postprocedure Evaluation (Signed)
  Anesthesia Post-op Note  Patient: Tammy [name]  Procedure(s) Performed: Procedure(s) (LRB): LEFT TOTAL KNEE ARTHROPLASTY (Left)  Patient Location: PACU  Anesthesia Type: Spinal  Level of Consciousness: awake and alert   Airway and Oxygen Therapy: Patient Spontanous Breathing  Post-op Pain: mild  Post-op Assessment: Post-op Vital signs reviewed, Patient's Cardiovascular Status Stable, Respiratory Function Stable, Patent Airway and No signs of Nausea or vomiting  Last Vitals:  Filed Vitals:   01/31/14 0531  BP: 130/69  Pulse: 81  Temp: 36.6 C  Resp: 16    Post-op Vital Signs: stable   Complications: No apparent anesthesia complications

## 2014-01-31 NOTE — Progress Notes (Signed)
Patient has [name] bed at [facility] when ready - anticipating discharge tomorrow. Patient states that her daughter, Jacqlyn Larsen will transport to SNF.   Clinical Social Work Department CLINICAL SOCIAL WORK PLACEMENT NOTE 01/31/2014  Patient:  Tammy [name], Tammy [name]  Account Number:  [id number] Admit date:  01/30/2014  Clinical Social Worker:  Renold Genta  Date/time:  01/31/2014 04:28 PM  Clinical Social Work is seeking post-discharge placement for this patient at the following level of care:   SKILLED NURSING   (*CSW will update this form in Epic as items are completed)   01/31/2014  Patient/family provided with [place] Department of Clinical Social Work's list of facilities offering this level of care within the geographic area requested by the patient (or if unable, by the patient's family).  01/31/2014  Patient/family informed of their freedom to choose among providers that offer the needed level of care, that participate in Medicare, Medicaid or managed care program needed by the patient, have an available bed and are willing to accept the patient.  01/31/2014  Patient/family informed of [facility]' ownership interest in [facility], as well as of the fact that they are under no obligation to receive care at this facility.  PASARR submitted to EDS on 01/31/2014 PASARR number received on 01/31/2014  FL2 transmitted to all facilities in geographic area requested by pt/family on  01/31/2014 FL2 transmitted to all facilities within larger geographic area on   Patient informed that his/her managed care company has contracts with or will negotiate with  certain facilities, including the following:     Patient/family informed of bed offers received:  01/31/2014 Patient chooses bed at [place] Physician recommends and patient chooses bed at    Patient to be transferred to [place] on   Patient to be transferred to facility by  Patient and family notified of  transfer on  Name of family member notified:    The following physician request were entered in Epic:   Additional Comments:   Raynaldo Opitz, [place] Social Worker cell #: [phone]

## 2014-01-31 NOTE — Evaluation (Signed)
Physical Therapy Evaluation Patient Details Name: Tammy [name] MRN: [id number] DOB: 04/12/1941 Today's Date: 01/31/2014   History of Present Illness  Pt is a 73 year old female who underwent L TKA.  She has h/o R TKA in 12/14  Clinical Impression  Pt s/p L TKR presents with decreased L LE strength/ROM and post op pain limiting functional mobility.  Pt would benefit from follow up rehab at SNF level to maximize IND and safety prior to return home with ltd assist.    Follow Up Recommendations SNF    Equipment Recommendations  None recommended by PT    Recommendations for Other Services OT consult     Precautions / Restrictions Precautions Precautions: Knee Restrictions Weight Bearing Restrictions: No Other Position/Activity Restrictions: WBAT      Mobility  Bed Mobility Overal bed mobility: Needs Assistance Bed Mobility: Supine to Sit     Supine to sit: Min assist     General bed mobility comments: cues for sequence and use of R LE to self assist  Transfers Overall transfer level: Needs assistance Equipment used: Rolling walker (2 wheeled) Transfers: Sit to/from Stand Sit to Stand: Min assist         General transfer comment: cues to extend LLE before sitting  Ambulation/Gait Ambulation/Gait assistance: Min assist Ambulation Distance (Feet): 55 Feet Assistive device: Rolling walker (2 wheeled) Gait Pattern/deviations: Step-to pattern;Decreased step length - right;Decreased step length - left;Shuffle;Trunk flexed     General Gait Details: cues for sequence, posture and position from [employer]            Wheelchair Mobility    Modified Rankin (Stroke Patients Only)       Balance                                             Pertinent Vitals/Pain 4/10; premed, ice packs provided    Home Living Family/patient expects to be discharged to:: Skilled nursing facility Living Arrangements: Alone                Additional Comments: has all DME.  Has a tub/shower with seat    Prior Function Level of Independence: Independent               Hand Dominance        Extremity/Trunk Assessment   Upper Extremity Assessment: Overall WFL for tasks assessed           Lower Extremity Assessment: LLE deficits/detail   LLE Deficits / Details: 3/5 quads with AAROM at knee -10 - 80  Cervical / Trunk Assessment: Normal  Communication   Communication: No difficulties  Cognition Arousal/Alertness: Awake/alert Behavior During Therapy: WFL for tasks assessed/performed Overall Cognitive Status: Within Functional Limits for tasks assessed                      General Comments      Exercises Total Joint Exercises Ankle Circles/Pumps: AROM;10 reps;Supine;Both Quad Sets: AROM;Both;10 reps;Supine Heel Slides: AAROM;Left;10 reps;Supine Straight Leg Raises: AAROM;AROM;Left;15 reps;Supine      Assessment/Plan    PT Assessment Patient needs continued PT services  PT Diagnosis Difficulty walking   PT Problem List Decreased strength;Decreased range of motion;Decreased activity tolerance;Decreased mobility;Decreased knowledge of use of DME;Pain;Decreased knowledge of precautions  PT Treatment Interventions DME instruction;Gait training;Stair training;Functional mobility training;Therapeutic activities;Therapeutic exercise;Patient/family education   PT  Goals (Current goals can be found in the Care Plan section) Acute Rehab PT Goals Patient Stated Goal: lose some more weight PT Goal Formulation: With patient Time For Goal Achievement: 02/08/14 Potential to Achieve Goals: Good    Frequency 7X/week   Barriers to discharge        Co-evaluation               End of Session Equipment Utilized During Treatment: Gait belt Activity Tolerance: Patient tolerated treatment well Patient left: in chair;with call bell/phone within reach Nurse Communication: Mobility status          Time: 6734-1937 PT Time Calculation (min): 26 min   Charges:   PT Evaluation $Initial PT Evaluation Tier I: 1 Procedure PT Treatments $Gait Training: 8-22 mins $Therapeutic Exercise: 8-22 mins   PT G Codes:          Mathis Fare 01/31/2014, 11:23 AM

## 2014-01-31 NOTE — Progress Notes (Signed)
   Subjective: 1 Day Post-Op Procedure(s) (LRB): LEFT TOTAL KNEE ARTHROPLASTY (Left) Patient reports pain as moderate.   Patient seen in rounds with Dr. Wynelle Link. Patient is well, and has had no acute complaints or problems. No issues overnight. No SOB or chest pain.  We will start therapy today.  Plan is to go Skilled nursing facility after hospital stay.  Objective: Vital signs in last 24 hours: Temp:  [97.4 F (36.3 C)-98.4 F (36.9 C)] 97.8 F (36.6 C) (06/09 0531) Pulse Rate:  [64-92] 81 (06/09 0531) Resp:  [13-18] 16 (06/09 0531) BP: (130-185)/(69-94) 130/69 mmHg (06/09 0531) SpO2:  [98 %-100 %] 98 % (06/09 0531) Weight:  [72.576 kg (160 lb)] 72.576 kg (160 lb) (06/08 1402)  Intake/Output from previous day:  Intake/Output Summary (Last 24 hours) at 01/31/14 0807 Last data filed at 01/31/14 0657  Gross per 24 hour  Intake 3181.25 ml  Output   2035 ml  Net 1146.25 ml     Labs:  Recent Labs  01/31/14 0440  HGB 9.1*    Recent Labs  01/31/14 0440  WBC 9.4  RBC 3.18*  HCT 27.4*  PLT 170    Recent Labs  01/31/14 0440  NA 141  K 4.3  CL 105  CO2 24  BUN 18  CREATININE 0.80  GLUCOSE 150*  CALCIUM 8.8    EXAM General - Patient is Alert and Oriented Extremity - Neurologically intact Intact pulses distally Dorsiflexion/Plantar flexion intact Compartment soft Dressing - dressing C/D/I Motor Function - intact, moving foot and toes well on exam.  Hemovac pulled without difficulty.  Past Medical History  Diagnosis Date  . Chicken pox   . Varicose vein   . Depression     mild, never on medication  . GERD (gastroesophageal reflux disease)   . Heart murmur     told in 2012 benign  . Hyperglycemia     borderline  . Anxiety   . Arthritis     knees and back  . High cholesterol     borderline, no meds  . Hypertension     high blood pressure readings, no meds  . DDD (degenerative disc disease), lumbar   . Scoliosis   . Osteoarthritis   .  Hemorrhoids   . Diabetes mellitus without complication     borderline , diet controlled   . Deviated septum   . Rib pain on left side 01/20/2014     xray done seen by MD 01/20/14 in EPIC   . Bruised rib 01/18/2014    x-rayed    Assessment/Plan: 1 Day Post-Op Procedure(s) (LRB): LEFT TOTAL KNEE ARTHROPLASTY (Left) Active Problems:   OA (osteoarthritis) of knee  Estimated body mass index is 30.25 kg/(m^2) as calculated from the following:   Height as of this encounter: 5\' 1"  (1.549 m).   Weight as of this encounter: 72.576 kg (160 lb). Advance diet Up with therapy D/C IV fluids when tolerating POs well  DVT Prophylaxis - Xarelto Weight-Bearing as tolerated  D/C O2 and Pulse OX and try on Room Air   Therapy today. Continue to monitor labs. Plan for SNF Thursday. Patient requests [facility].   Ardeen Jourdain, PA-C Orthopaedic Surgery 01/31/2014, 8:07 AM

## 2014-01-31 NOTE — Discharge Instructions (Addendum)
° °Dr. Ursula Dermody °Total Joint Specialist °[facility] °[address] °[place], [place] [address] °[phone] ° °TOTAL KNEE REPLACEMENT POSTOPERATIVE DIRECTIONS ° ° ° °Knee Rehabilitation, Guidelines Following Surgery  °Results after knee surgery are often greatly improved when you follow the exercise, range of motion and muscle strengthening exercises prescribed by your doctor. Safety measures are also important to protect the knee from further injury. Any time any of these exercises cause you to have increased pain or swelling in your knee joint, decrease the amount until you are comfortable again and slowly increase them. If you have problems or questions, call your caregiver or physical therapist for advice.  ° °HOME CARE INSTRUCTIONS  °Remove items at home which could result in a fall. This includes throw rugs or furniture in walking pathways.  °Continue medications as instructed at time of discharge. °You may have some home medications which will be placed on hold until you complete the course of blood thinner medication.  °You may start showering once you are discharged home but do not submerge the incision under water. Just pat the incision dry and apply a dry gauze dressing on daily. °Walk with walker as instructed.  °You may resume a sexual relationship in one month or when given the OK by  your doctor.  °· Use walker as long as suggested by your caregivers. °· Avoid periods of inactivity such as sitting longer than an hour when not asleep. This helps prevent blood clots.  °You may put full weight on your legs and walk as much as is comfortable.  °You may return to work once you are cleared by your doctor.  °Do not drive a car for 6 weeks or until released by you surgeon.  °· Do not drive while taking narcotics.  °Wear the elastic stockings for three weeks following surgery during the day but you may remove then at night. °Make sure you keep all of your appointments after your  operation with all of your doctors and caregivers. You should call the office at the above phone number and make an appointment for approximately two weeks after the date of your surgery. °Change the dressing daily and reapply a dry dressing each time. °Please pick up a stool softener and laxative for home use as long as you are requiring pain medications. °· Continue to use ice on the knee for pain and swelling from surgery. You may notice swelling that will progress down to the foot and ankle.  This is normal after surgery.  Elevate the leg when you are not up walking on it.   °It is important for you to complete the blood thinner medication as prescribed by your doctor. °· Continue to use the breathing machine which will help keep your temperature down.  It is common for your temperature to cycle up and down following surgery, especially at night when you are not up moving around and exerting yourself.  The breathing machine keeps your lungs expanded and your temperature down. ° °RANGE OF MOTION AND STRENGTHENING EXERCISES  °Rehabilitation of the knee is important following a knee injury or an operation. After just a few days of immobilization, the muscles of the thigh which control the knee become weakened and shrink (atrophy). Knee exercises are designed to build up the tone and strength of the thigh muscles and to improve knee motion. Often times heat used for twenty to thirty minutes before working out will loosen up your tissues and help with improving the   range of motion but do not use heat for the first two weeks following surgery. These exercises can be done on a training (exercise) mat, on the floor, on a table or on a bed. Use what ever works the best and is most comfortable for you Knee exercises include:  Leg Lifts - While your knee is still immobilized in a splint or cast, you can do straight leg raises. Lift the leg to 60 degrees, hold for 3 sec, and slowly lower the leg. Repeat 10-20 times 2-3  times daily. Perform this exercise against resistance later as your knee gets better.  Quad and Hamstring Sets - Tighten up the muscle on the front of the thigh (Quad) and hold for 5-10 sec. Repeat this 10-20 times hourly. Hamstring sets are done by pushing the foot backward against an object and holding for 5-10 sec. Repeat as with quad sets.  A rehabilitation program following serious knee injuries can speed recovery and prevent re-injury in the future due to weakened muscles. Contact your doctor or a physical therapist for more information on knee rehabilitation.   SKILLED REHAB INSTRUCTIONS: If the patient is transferred to a skilled rehab facility following release from the hospital, a list of the current medications will be sent to the facility for the patient to continue.  When discharged from the skilled rehab facility, please have the facility set up the patient's [place] prior to being released. Also, the skilled facility will be responsible for providing the patient with their medications at time of release from the facility to include their pain medication, the muscle relaxants, and their blood thinner medication. If the patient is still at the rehab facility at time of the two week follow up appointment, the skilled rehab facility will also need to assist the patient in arranging follow up appointment in our office and any transportation needs.  MAKE SURE YOU:  Understand these instructions.  Will watch your condition.  Will get help right away if you are not doing well or get worse.    Pick up stool softner and laxative for home. Do not submerge incision under water. May shower. Continue to use ice for pain and swelling from surgery.            Information on my medicine - XARELTO (Rivaroxaban)  This medication education was reviewed with me or my healthcare representative as part of my discharge preparation.  The pharmacist that spoke with me during  my hospital stay was:  Kara Mead, [facility]  Why was Xarelto prescribed for you? Xarelto was prescribed for you to reduce the risk of blood clots forming after orthopedic surgery. The medical term for these abnormal blood clots is venous thromboembolism (VTE).  What do you need to know about xarelto ? Take your Xarelto ONCE DAILY at the same time every day. You may take it either with or without food.  If you have difficulty swallowing the tablet whole, you may crush it and mix in applesauce just prior to taking your dose.  Take Xarelto exactly as prescribed by your doctor and DO NOT stop taking Xarelto without talking to the doctor who prescribed the medication.  Stopping without other VTE prevention medication to take the place of Xarelto may increase your risk of developing a clot.  After discharge, you should have regular check-up appointments with your healthcare provider that is prescribing your Xarelto.    What do you do if you miss a dose? If you miss  a dose, take it as soon as you remember on the same day then continue your regularly scheduled once daily regimen the next day. Do not take two doses of Xarelto on the same day.   Important Safety Information A possible side effect of Xarelto is bleeding. You should call your healthcare provider right away if you experience any of the following:   Bleeding from an injury or your nose that does not stop.   Unusual colored urine (red or dark brown) or unusual colored stools (red or black).   Unusual bruising for unknown reasons.   A serious fall or if you hit your head (even if there is no bleeding).  Some medicines may interact with Xarelto and might increase your risk of bleeding while on Xarelto. To help avoid this, consult your healthcare provider or pharmacist prior to using any new prescription or non-prescription medications, including herbals, vitamins, non-steroidal anti-inflammatory drugs (NSAIDs) and  supplements.  This website has more information on Xarelto: [email or web address]

## 2014-02-01 ENCOUNTER — Encounter: Payer: Self-pay | Admitting: Adult Health

## 2014-02-01 ENCOUNTER — Non-Acute Institutional Stay (SKILLED_NURSING_FACILITY): Payer: Medicare Other | Admitting: Adult Health

## 2014-02-01 DIAGNOSIS — M179 Osteoarthritis of knee, unspecified: Secondary | ICD-10-CM

## 2014-02-01 DIAGNOSIS — D62 Acute posthemorrhagic anemia: Secondary | ICD-10-CM

## 2014-02-01 DIAGNOSIS — IMO0002 Reserved for concepts with insufficient information to code with codable children: Secondary | ICD-10-CM

## 2014-02-01 DIAGNOSIS — M1710 Unilateral primary osteoarthritis, unspecified knee: Secondary | ICD-10-CM

## 2014-02-01 DIAGNOSIS — K5900 Constipation, unspecified: Secondary | ICD-10-CM

## 2014-02-01 DIAGNOSIS — K219 Gastro-esophageal reflux disease without esophagitis: Secondary | ICD-10-CM

## 2014-02-01 DIAGNOSIS — G4700 Insomnia, unspecified: Secondary | ICD-10-CM | POA: Insufficient documentation

## 2014-02-01 LAB — CBC
HCT: 23.3 % — ABNORMAL LOW (ref 36.0–46.0)
Hemoglobin: 7.8 g/dL — ABNORMAL LOW (ref 12.0–15.0)
MCH: 29.3 pg (ref 26.0–34.0)
MCHC: 33.5 g/dL (ref 30.0–36.0)
MCV: 87.6 fL (ref 78.0–100.0)
Platelets: 136 10*3/uL — ABNORMAL LOW (ref 150–400)
RBC: 2.66 MIL/uL — AB (ref 3.87–5.11)
RDW: 14.2 % (ref 11.5–15.5)
WBC: 6.8 10*3/uL (ref 4.0–10.5)

## 2014-02-01 LAB — GLUCOSE, CAPILLARY: GLUCOSE-CAPILLARY: 110 mg/dL — AB (ref 70–99)

## 2014-02-01 LAB — BASIC METABOLIC PANEL
BUN: 16 mg/dL (ref 6–23)
CO2: 26 meq/L (ref 19–32)
Calcium: 8.7 mg/dL (ref 8.4–10.5)
Chloride: 104 mEq/L (ref 96–112)
Creatinine, Ser: 0.63 mg/dL (ref 0.50–1.10)
GFR calc Af Amer: >90 mL/min (ref >90)
GFR, EST NON AFRICAN AMERICAN: 87 mL/min — AB (ref >90)
Glucose, Bld: 116 mg/dL — ABNORMAL HIGH (ref 70–99)
POTASSIUM: 3.9 meq/L (ref 3.7–5.3)
SODIUM: 140 meq/L (ref 137–147)

## 2014-02-01 MED ORDER — DIPHENHYDRAMINE HCL (SLEEP) 25 MG PO TABS: 25.0000 mg | ORAL_TABLET | Freq: Every evening | ORAL | Status: DC | PRN

## 2014-02-01 MED ORDER — POLYSACCHARIDE IRON COMPLEX 150 MG PO CAPS: 150.0000 mg | ORAL_CAPSULE | Freq: Every day | ORAL | Status: DC

## 2014-02-01 NOTE — Progress Notes (Signed)
Clinical Social Work Department CLINICAL SOCIAL WORK PLACEMENT NOTE 02/01/2014  Patient:  Tammy [name], Tammy [name]  Account Number:  [id number] Admit date:  01/30/2014  Clinical Social Worker:  Renold Genta  Date/time:  01/31/2014 04:28 PM  Clinical Social Work is seeking post-discharge placement for this patient at the following level of care:   SKILLED NURSING   (*CSW will update this form in Epic as items are completed)   01/31/2014  Patient/family provided with [place] Department of Clinical Social Work's list of facilities offering this level of care within the geographic area requested by the patient (or if unable, by the patient's family).  01/31/2014  Patient/family informed of their freedom to choose among providers that offer the needed level of care, that participate in Medicare, Medicaid or managed care program needed by the patient, have an available bed and are willing to accept the patient.  01/31/2014  Patient/family informed of [facility]' ownership interest in [facility], as well as of the fact that they are under no obligation to receive care at this facility.  PASARR submitted to EDS on 01/31/2014 PASARR number received on 01/31/2014  FL2 transmitted to all facilities in geographic area requested by pt/family on  01/31/2014 FL2 transmitted to all facilities within larger geographic area on   Patient informed that his/her managed care company has contracts with or will negotiate with  certain facilities, including the following:     Patient/family informed of bed offers received:  01/31/2014 Patient chooses bed at [place] Physician recommends and patient chooses bed at    Patient to be transferred to [place] on  02/01/2014 Patient to be transferred to facility by FAMILY Patient and family notified of transfer on 02/01/2014 Name of family member notified:  Jacqlyn Larsen Derek Jack  The following physician request were entered in  Epic:   Additional Comments:  PT consulted. Confirmed pt is ok to d/c by car to SNF. Nsg reviewed D/C summary, AVS and scripts. Scripts included in D/C packet. Pt / daughter are in agreement with d/c plan.  Werner Lean LCSW [phone]

## 2014-02-01 NOTE — Discharge Summary (Signed)
Physician Discharge Summary   Patient ID: Tammy [name] MRN: [id number] DOB/AGE: [birth year] 73 y.o.  Admit date: 01/30/2014 Discharge date:   Primary Diagnosis: Osteoarthritis, left knee   Admission Diagnoses:  Past Medical History  Diagnosis Date  . Chicken pox   . Varicose vein   . Depression     mild, never on medication  . GERD (gastroesophageal reflux disease)   . Heart murmur     told in 2012 benign  . Hyperglycemia     borderline  . Anxiety   . Arthritis     knees and back  . High cholesterol     borderline, no meds  . Hypertension     high blood pressure readings, no meds  . DDD (degenerative disc disease), lumbar   . Scoliosis   . Osteoarthritis   . Hemorrhoids   . Diabetes mellitus without complication     borderline , diet controlled   . Deviated septum   . Rib pain on left side 01/20/2014     xray done seen by MD 01/20/14 in [facility]   . Bruised rib 01/18/2014    x-rayed   Discharge Diagnoses:   Active Problems:   OA (osteoarthritis) of knee  Estimated body mass index is 30.25 kg/(m^2) as calculated from the following:   Height as of this encounter: 5' 1"  (1.549 m).   Weight as of this encounter: 72.576 kg (160 lb).  Procedure:  Procedure(s) (LRB): LEFT TOTAL KNEE ARTHROPLASTY (Left)   Consults: None  HPI: Tammy [name], 73 y.o. female, has a history of pain and functional disability in the left knee due to arthritis and has failed non-surgical conservative treatments for greater than 12 weeks to includeNSAID's and/or analgesics, corticosteriod injections, flexibility and strengthening excercises and activity modification. Onset of symptoms was gradual, starting 5 years ago with gradually worsening course since that time. The patient noted no past surgery on the left knee(s). Patient currently rates pain in the left knee(s) at 5 out of 10 with activity. Patient has night pain, worsening of pain with activity and weight bearing, pain that  interferes with activities of daily living, pain with passive range of motion, crepitus and joint swelling. Patient has evidence of periarticular osteophytes and joint space narrowing by imaging studies. There is no active infection.   Laboratory Data: Admission on 01/30/2014  Component Date Value Ref Range Status  . Glucose-Capillary 01/30/2014 107* 70 - 99 mg/dL Final  . Glucose-Capillary 01/30/2014 102* 70 - 99 mg/dL Final  . Comment 1 01/30/2014 Documented in Chart   Final  . Comment 2 01/30/2014 Notify RN   Final  . Glucose-Capillary 01/30/2014 198* 70 - 99 mg/dL Final  . Comment 1 01/30/2014 Notify RN   Final  . Comment 2 01/30/2014 Documented in Chart   Final  . WBC 01/31/2014 9.4  4.0 - 10.5 K/uL Final  . RBC 01/31/2014 3.18* 3.87 - 5.11 MIL/uL Final  . Hemoglobin 01/31/2014 9.1* 12.0 - 15.0 g/dL Final  . HCT 01/31/2014 27.4* 36.0 - 46.0 % Final  . MCV 01/31/2014 86.2  78.0 - 100.0 fL Final  . MCH 01/31/2014 28.6  26.0 - 34.0 pg Final  . MCHC 01/31/2014 33.2  30.0 - 36.0 g/dL Final  . RDW 01/31/2014 13.7  11.5 - 15.5 % Final  . Platelets 01/31/2014 170  150 - 400 K/uL Final  . Sodium 01/31/2014 141  137 - 147 mEq/L Final  . Potassium 01/31/2014 4.3  3.7 - 5.3 mEq/L Final  .  Chloride 01/31/2014 105  96 - 112 mEq/L Final  . CO2 01/31/2014 24  19 - 32 mEq/L Final  . Glucose, Bld 01/31/2014 150* 70 - 99 mg/dL Final  . BUN 01/31/2014 18  6 - 23 mg/dL Final  . Creatinine, Ser 01/31/2014 0.80  0.50 - 1.10 mg/dL Final  . Calcium 01/31/2014 8.8  8.4 - 10.5 mg/dL Final  . GFR calc non Af Amer 01/31/2014 71* >90 mL/min Final  . GFR calc Af Amer 01/31/2014 83* >90 mL/min Final   Comment: (NOTE)                          The eGFR has been calculated using the CKD EPI equation.                          This calculation has not been validated in all clinical situations.                          eGFR's persistently <90 mL/min signify possible Chronic Kidney                          Disease.   . Glucose-Capillary 01/30/2014 152* 70 - 99 mg/dL Final  . Glucose-Capillary 01/31/2014 133* 70 - 99 mg/dL Final  . Glucose-Capillary 01/31/2014 134* 70 - 99 mg/dL Final  . Comment 1 01/31/2014 Notify RN   Final  . Comment 2 01/31/2014 Documented in Chart   Final  . WBC 02/01/2014 6.8  4.0 - 10.5 K/uL Final  . RBC 02/01/2014 2.66* 3.87 - 5.11 MIL/uL Final  . Hemoglobin 02/01/2014 7.8* 12.0 - 15.0 g/dL Final  . HCT 02/01/2014 23.3* 36.0 - 46.0 % Final  . MCV 02/01/2014 87.6  78.0 - 100.0 fL Final  . MCH 02/01/2014 29.3  26.0 - 34.0 pg Final  . MCHC 02/01/2014 33.5  30.0 - 36.0 g/dL Final  . RDW 02/01/2014 14.2  11.5 - 15.5 % Final  . Platelets 02/01/2014 136* 150 - 400 K/uL Final  . Sodium 02/01/2014 140  137 - 147 mEq/L Final  . Potassium 02/01/2014 3.9  3.7 - 5.3 mEq/L Final  . Chloride 02/01/2014 104  96 - 112 mEq/L Final  . CO2 02/01/2014 26  19 - 32 mEq/L Final  . Glucose, Bld 02/01/2014 116* 70 - 99 mg/dL Final  . BUN 02/01/2014 16  6 - 23 mg/dL Final  . Creatinine, Ser 02/01/2014 0.63  0.50 - 1.10 mg/dL Final  . Calcium 02/01/2014 8.7  8.4 - 10.5 mg/dL Final  . GFR calc non Af Amer 02/01/2014 87* >90 mL/min Final  . GFR calc Af Amer 02/01/2014 >90  >90 mL/min Final   Comment: (NOTE)                          The eGFR has been calculated using the CKD EPI equation.                          This calculation has not been validated in all clinical situations.                          eGFR's persistently <90 mL/min signify possible Chronic Kidney  Disease.  . Glucose-Capillary 01/31/2014 150* 70 - 99 mg/dL Final  . Comment 1 01/31/2014 Notify RN   Final  . Comment 2 01/31/2014 Documented in Chart   Final  . Glucose-Capillary 01/31/2014 138* 70 - 99 mg/dL Final  . Glucose-Capillary 02/01/2014 110* 70 - 99 mg/dL Final  Hospital Outpatient Visit on 01/23/2014  Component Date Value Ref Range Status  . aPTT 01/23/2014 32  24 - 37 seconds Final  . WBC  01/23/2014 4.4  4.0 - 10.5 K/uL Final  . RBC 01/23/2014 4.43  3.87 - 5.11 MIL/uL Final  . Hemoglobin 01/23/2014 12.6  12.0 - 15.0 g/dL Final  . HCT 01/23/2014 38.8  36.0 - 46.0 % Final  . MCV 01/23/2014 87.6  78.0 - 100.0 fL Final  . MCH 01/23/2014 28.4  26.0 - 34.0 pg Final  . MCHC 01/23/2014 32.5  30.0 - 36.0 g/dL Final  . RDW 01/23/2014 13.8  11.5 - 15.5 % Final  . Platelets 01/23/2014 173  150 - 400 K/uL Final  . Sodium 01/23/2014 137  137 - 147 mEq/L Final  . Potassium 01/23/2014 4.6  3.7 - 5.3 mEq/L Final  . Chloride 01/23/2014 99  96 - 112 mEq/L Final  . CO2 01/23/2014 27  19 - 32 mEq/L Final  . Glucose, Bld 01/23/2014 92  70 - 99 mg/dL Final  . BUN 01/23/2014 11  6 - 23 mg/dL Final  . Creatinine, Ser 01/23/2014 0.75  0.50 - 1.10 mg/dL Final  . Calcium 01/23/2014 9.6  8.4 - 10.5 mg/dL Final  . Total Protein 01/23/2014 7.1  6.0 - 8.3 g/dL Final  . Albumin 01/23/2014 3.8  3.5 - 5.2 g/dL Final  . AST 01/23/2014 26  0 - 37 U/L Final  . ALT 01/23/2014 17  0 - 35 U/L Final  . Alkaline Phosphatase 01/23/2014 82  39 - 117 U/L Final  . Total Bilirubin 01/23/2014 0.4  0.3 - 1.2 mg/dL Final  . GFR calc non Af Amer 01/23/2014 82* >90 mL/min Final  . GFR calc Af Amer 01/23/2014 >90  >90 mL/min Final   Comment: (NOTE)                          The eGFR has been calculated using the CKD EPI equation.                          This calculation has not been validated in all clinical situations.                          eGFR's persistently <90 mL/min signify possible Chronic Kidney                          Disease.  Marland Kitchen Prothrombin Time 01/23/2014 12.4  11.6 - 15.2 seconds Final  . INR 01/23/2014 0.94  0.00 - 1.49 Final  . Color, Urine 01/23/2014 YELLOW  YELLOW Final  . APPearance 01/23/2014 CLEAR  CLEAR Final  . Specific Gravity, Urine 01/23/2014 1.018  1.005 - 1.030 Final  . pH 01/23/2014 8.0  5.0 - 8.0 Final  . Glucose, UA 01/23/2014 NEGATIVE  NEGATIVE mg/dL Final  . Hgb urine dipstick  01/23/2014 NEGATIVE  NEGATIVE Final  . Bilirubin Urine 01/23/2014 LARGE* NEGATIVE Final  . Ketones, ur 01/23/2014 NEGATIVE  NEGATIVE mg/dL Final  . Protein, ur 01/23/2014 NEGATIVE  NEGATIVE  mg/dL Final  . Urobilinogen, UA 01/23/2014 0.2  0.0 - 1.0 mg/dL Final  . Nitrite 01/23/2014 NEGATIVE  NEGATIVE Final  . Leukocytes, UA 01/23/2014 TRACE* NEGATIVE Final  . ABO/RH(D) 01/23/2014 A POS   Final  . Antibody Screen 01/23/2014 NEG   Final  . Sample Expiration 01/23/2014 02/02/2014   Final  . Squamous Epithelial / LPF 01/23/2014 RARE  RARE Final  . WBC, UA 01/23/2014 0-2  <3 WBC/hpf Final  . RBC / HPF 01/23/2014 0-2  <3 RBC/hpf Final  . Bacteria, UA 01/23/2014 RARE  RARE Final  . Casts 01/23/2014 HYALINE CASTS* NEGATIVE Final  . MRSA, PCR 01/23/2014 NEGATIVE  NEGATIVE Final  . Staphylococcus aureus 01/23/2014 NEGATIVE  NEGATIVE Final   Comment:                                 The Xpert SA Assay (FDA                          approved for NASAL specimens                          in patients over 78 years of age),                          is one component of                          a comprehensive surveillance                          program.  Test performance has                          been validated by [employer] for patients greater                          than or equal to 4 year old.                          It is not intended                          to diagnose infection nor to                          guide or monitor treatment.     X-Rays:Dg Chest 2 View  01/20/2014   CLINICAL DATA:  Chest pain with inspiration, left rib pain  EXAM: CHEST  2 VIEW  COMPARISON:  04/20/2013  FINDINGS: Cardiomediastinal silhouette is stable. No acute infiltrate or pleural effusion. No pulmonary edema. Mild degenerative changes thoracic spine.  IMPRESSION: No active cardiopulmonary disease.   Electronically Signed   By: Lahoma Crocker M.D.   On: 01/20/2014 10:11     EKG: Orders placed during the hospital encounter of 04/22/13  . EKG     Hospital Course: Tammy [name] is a 73 y.o. who was admitted to [facility].  They were brought to the operating room on 01/30/2014 and underwent Procedure(s): LEFT TOTAL KNEE ARTHROPLASTY.  Patient tolerated the procedure well and was later transferred to the recovery room and then to the orthopaedic floor for postoperative care.  They were given PO and IV analgesics for pain control following their surgery.  They were given 24 hours of postoperative antibiotics of  Anti-infectives   Start     Dose/Rate Route Frequency Ordered Stop   01/30/14 1800  ceFAZolin (ANCEF) IVPB 2 g/50 mL premix     2 g 100 mL/hr over 30 Minutes Intravenous 4 times per day 01/30/14 1348 01/31/14 0036   01/30/14 0834  ceFAZolin (ANCEF) IVPB 2 g/50 mL premix     2 g 100 mL/hr over 30 Minutes Intravenous On call to O.R. 01/30/14 0932 01/30/14 1146     and started on DVT prophylaxis in the form of Xarelto.   PT and OT were ordered for total joint protocol.  Discharge planning consulted to help with postop disposition and equipment needs.  Patient had a fair night on the evening of surgery.  They started to get up OOB with therapy on day one. Hemovac drain was pulled without difficulty.  Continued to work with therapy into day two.  Dressing was changed on day two and the incision had some mild frank bloody drainage from the mid-portion of her incision. Additional steri-strips put in place.  She developed ABLA but was asymptomatic. Decided to treat with iron supplementation. Patient had progressed with therapy and meeting their goals.  Incision was healing well.  Patient was seen in rounds and was ready to go home.   Diet: Diabetic diet Activity:WBAT Follow-up:in 2 weeks Disposition - Skilled nursing facility Discharged Condition: stable   Discharge Instructions   Call MD / Call 911    Complete by:  As directed   If you  experience chest pain or shortness of breath, CALL 911 and be transported to the hospital emergency room.  If you develope a fever above 101 F, pus (white drainage) or increased drainage or redness at the wound, or calf pain, call your surgeon's office.     Change dressing    Complete by:  As directed   Change dressing daily with sterile 4 x 4 inch gauze dressing and apply TED hose.     Constipation Prevention    Complete by:  As directed   Drink plenty of fluids.  Prune juice may be helpful.  You may use a stool softener, such as Colace (over the counter) 100 mg twice a day.  Use MiraLax (over the counter) for constipation as needed.     Diet general    Complete by:  As directed      Discharge instructions    Complete by:  As directed   Walk with your walker. Weight bearing as tolerated Change your dressing daily. Shower only, no tub bath. Call if any temperatures greater than 101 or any wound complications: [phone]     Do not put a pillow under the knee. Place it under the heel.    Complete by:  As directed      Driving restrictions    Complete by:  As directed   No driving     Increase activity slowly as tolerated    Complete by:  As directed      TED hose    Complete by:  As directed   Use stockings (TED hose) for 3 weeks on both leg(s).  You may remove them at night for sleeping.            Medication List    STOP taking these medications       aspirin 325 MG tablet     CALTRATE 600+D 600-400 MG-UNIT per tablet  Generic drug:  Calcium Carbonate-Vitamin D     cyclobenzaprine 10 MG tablet  Commonly known as:  FLEXERIL     etodolac 400 MG tablet  Commonly known as:  LODINE     PERCOCET 5-325 MG per tablet  Generic drug:  oxyCODONE-acetaminophen      TAKE these medications       diphenhydrAMINE 25 MG tablet  Commonly known as:  SOMINEX  Take 1-2 tablets (25-50 mg total) by mouth at bedtime as needed for sleep.     DSS 100 MG Caps  Take 100 mg by mouth 2  (two) times daily.     iron polysaccharides 150 MG capsule  Commonly known as:  NIFEREX  Take 1 capsule (150 mg total) by mouth daily.     methocarbamol 500 MG tablet  Commonly known as:  ROBAXIN  Take 1 tablet (500 mg total) by mouth 3 (three) times daily as needed for muscle spasms.     oxyCODONE 5 MG immediate release tablet  Commonly known as:  Oxy IR/ROXICODONE  Take 1-2 tablets (5-10 mg total) by mouth every 3 (three) hours as needed for breakthrough pain.     pantoprazole 40 MG tablet  Commonly known as:  PROTONIX  Take 40 mg by mouth every morning.     rivaroxaban 10 MG Tabs tablet  Commonly known as:  XARELTO  Take 1 tablet (10 mg total) by mouth daily with breakfast.     traMADol 50 MG tablet  Commonly known as:  ULTRAM  Take 1-2 tablets (50-100 mg total) by mouth every 6 (six) hours as needed for moderate pain.           Follow-up Information   Follow up with Gearlean Alf, MD. Schedule an appointment as soon as possible for a visit in 2 weeks.   Specialty:  Orthopedic Surgery   Contact information:   [address] [place] [address] [phone]       Signed: Ardeen Jourdain, PA-C Orthopaedic Surgery 02/01/2014, 8:16 AM

## 2014-02-01 NOTE — Progress Notes (Signed)
   Subjective: 2 Days Post-Op Procedure(s) (LRB): LEFT TOTAL KNEE ARTHROPLASTY (Left) Patient reports pain as mild.   Patient seen in rounds with Dr. Wynelle Link. Patient is well, and has had no acute complaints or problems. She reports that she does have pain but she feels that she is improving. She denies SOB and chest pain.  No issues overnight.  Plan is to go Skilled nursing facility after hospital stay.  Objective: Vital signs in last 24 hours: Temp:  [97.7 F (36.5 C)-98.5 F (36.9 C)] 98.1 F (36.7 C) (06/10 0637) Pulse Rate:  [77-86] 80 (06/10 0637) Resp:  [15-18] 15 (06/10 0637) BP: (108-145)/(60-77) 136/77 mmHg (06/10 0637) SpO2:  [92 %-98 %] 97 % (06/10 0637)  Intake/Output from previous day:  Intake/Output Summary (Last 24 hours) at 02/01/14 0810 Last data filed at 02/01/14 4628  Gross per 24 hour  Intake 1868.75 ml  Output    902 ml  Net 966.75 ml    Intake/Output this shift:    Labs:  Recent Labs  01/31/14 0440 02/01/14 0448  HGB 9.1* 7.8*    Recent Labs  01/31/14 0440 02/01/14 0448  WBC 9.4 6.8  RBC 3.18* 2.66*  HCT 27.4* 23.3*  PLT 170 136*    Recent Labs  01/31/14 0440 02/01/14 0448  NA 141 140  K 4.3 3.9  CL 105 104  CO2 24 26  BUN 18 16  CREATININE 0.80 0.63  GLUCOSE 150* 116*  CALCIUM 8.8 8.7    EXAM General - Patient is Alert and Oriented Extremity - Neurologically intact Intact pulses distally Dorsiflexion/Plantar flexion intact Compartment soft Dressing/Incision - clean, dry, frank blood drainage from mid-portion of incision Motor Function - intact, moving foot and toes well on exam.   Past Medical History  Diagnosis Date  . Chicken pox   . Varicose vein   . Depression     mild, never on medication  . GERD (gastroesophageal reflux disease)   . Heart murmur     told in 2012 benign  . Hyperglycemia     borderline  . Anxiety   . Arthritis     knees and back  . High cholesterol     borderline, no meds  .  Hypertension     high blood pressure readings, no meds  . DDD (degenerative disc disease), lumbar   . Scoliosis   . Osteoarthritis   . Hemorrhoids   . Diabetes mellitus without complication     borderline , diet controlled   . Deviated septum   . Rib pain on left side 01/20/2014     xray done seen by MD 01/20/14 in [facility]   . Bruised rib 01/18/2014    x-rayed    Assessment/Plan: 2 Days Post-Op Procedure(s) (LRB): LEFT TOTAL KNEE ARTHROPLASTY (Left) Active Problems:   OA (osteoarthritis) of knee  Estimated body mass index is 30.25 kg/(m^2) as calculated from the following:   Height as of this encounter: 5\' 1"  (1.549 m).   Weight as of this encounter: 72.576 kg (160 lb). Advance diet Up with therapy D/C IV fluids Discharge to SNF  DVT Prophylaxis - Xarelto Weight-Bearing as tolerated    She is doing well. DC to SNF today. ABLA but she is asymptomatic. Will supplement with iron.   Ardeen Jourdain, PA-C Orthopaedic Surgery 02/01/2014, 8:10 AM

## 2014-02-01 NOTE — Progress Notes (Signed)
Physical Therapy Treatment Patient Details Name: Tammy [name] MRN: [id number] DOB: Feb 08, 1941 Today's Date: 03-Feb-2014    History of Present Illness Pt is a 73 year old female who underwent L TKA.  She has h/o R TKA in 12/14    PT Comments    Pt progressing well and planning for d/c to SNF this pm  Follow Up Recommendations  SNF     Equipment Recommendations  None recommended by PT    Recommendations for Other Services OT consult     Precautions / Restrictions Precautions Precautions: Knee Restrictions Weight Bearing Restrictions: No Other Position/Activity Restrictions: WBAT    Mobility  Bed Mobility Overal bed mobility: Needs Assistance Bed Mobility: Supine to Sit;Sit to Supine     Supine to sit: Min guard Sit to supine: Min assist   General bed mobility comments: cues for sequence and use of R LE to self assist  Transfers Overall transfer level: Needs assistance Equipment used: Rolling walker (2 wheeled) Transfers: Sit to/from Stand Sit to Stand: Min assist         General transfer comment: cues to extend LLE before sitting  Ambulation/Gait Ambulation/Gait assistance: Min assist;Min guard Ambulation Distance (Feet): 140 Feet Assistive device: Rolling walker (2 wheeled) Gait Pattern/deviations: Step-to pattern;Decreased step length - right;Decreased step length - left;Shuffle;Trunk flexed     General Gait Details: cues for sequence, posture and position from [employer]            Wheelchair Mobility    Modified Rankin (Stroke Patients Only)       Balance                                    Cognition Arousal/Alertness: Awake/alert Behavior During Therapy: WFL for tasks assessed/performed Overall Cognitive Status: Within Functional Limits for tasks assessed                      Exercises Total Joint Exercises Ankle Circles/Pumps: AROM;Supine;Both;15 reps Quad Sets: AROM;Both;Supine;15 reps Heel  Slides: AAROM;Left;Supine;15 reps Straight Leg Raises: AAROM;AROM;Left;15 reps;Supine Goniometric ROM: AAROM at L knee - 10 - 70    General Comments        Pertinent Vitals/Pain 5/10; premed, cold packs provided    Home Living                      Prior Function            PT Goals (current goals can now be found in the care plan section) Acute Rehab PT Goals Patient Stated Goal: Rehab and home to resume previous lifestyle with decreased pain PT Goal Formulation: With patient Time For Goal Achievement: 02/08/14 Potential to Achieve Goals: Good Progress towards PT goals: Progressing toward goals    Frequency  7X/week    PT Plan Current plan remains appropriate    Co-evaluation             End of Session Equipment Utilized During Treatment: Gait belt Activity Tolerance: Patient tolerated treatment well Patient left: in bed;with call bell/phone within reach     Time: 0918-0950 PT Time Calculation (min): 32 min  Charges:  $Gait Training: 8-22 mins $Therapeutic Exercise: 8-22 mins                    G Codes:      Tammy [name] 2014/02/03, 11:10 AM

## 2014-02-01 NOTE — Progress Notes (Signed)
Patient ID: Tammy [name], female   DOB: 07-01-1941, 73 y.o.   MRN: [id number]               PROGRESS NOTE  DATE: 02/01/2014  FACILITY: Nursing Home Location: [facility]  LEVEL OF CARE: SNF (31)  Acute Visit  CHIEF COMPLAINT:  Follow-up Hospitalization   HISTORY OF PRESENT ILLNESS: This is a 73 year old female who has been admitted to [facility] today, 02/01/14/ from [facility] with Osteoarthritis S/P Left total knee arthroplasty. She has been admitted for a short-term rehabilitatgion.   REASSESSMENT OF ONGOING PROBLEM(S):   ANEMIA: The anemia has been stable. The patient denies fatigue, melena or hematochezia. No complications from the medications currently being used. 6/15 hgb 7.8  GERD: pt's GERD is stable.  Denies ongoing heartburn, abd. Pain, nausea or vomiting.  Currently on a PPI & tolerates it without any adverse reactions.  CONSTIPATION: The constipation remains stable. No complications from the medications presently being used. Patient denies ongoing constipation, abdominal pain, nausea or vomiting.  PAST MEDICAL HISTORY : Reviewed.  No changes/see problem list  CURRENT MEDICATIONS: Reviewed per MAR/see medication list  REVIEW OF SYSTEMS:  GENERAL: no change in appetite, no fatigue, no weight changes, no fever, chills or weakness RESPIRATORY: no cough, SOB, DOE, wheezing, hemoptysis CARDIAC: no chest pain, edema or palpitations GI: no abdominal pain, diarrhea, constipation, heart burn, nausea or vomiting  PHYSICAL EXAMINATION  GENERAL: no acute distress, normal body habitus EYES: conjunctivae normal, sclerae normal, normal eye lids NECK: supple, trachea midline, no neck masses, no thyroid tenderness, no thyromegaly LYMPHATICS: no LAN in the neck, no supraclavicular LAN RESPIRATORY: breathing is even & unlabored, BS CTAB CARDIAC: RRR, no murmur,no extra heart sounds, no edema GI: abdomen soft, normal BS, no masses, no  tenderness, no hepatomegaly, no splenomegaly EXTREMITIES: able to move all 4 extremities PSYCHIATRIC: the patient is alert & oriented to person, affect & behavior appropriate  LABS/RADIOLOGY: Labs reviewed: Basic Metabolic Panel:  Recent Labs  01/23/14 1410 01/31/14 0440 02/01/14 0448  NA 137 141 140  K 4.6 4.3 3.9  CL 99 105 104  CO2 27 24 26   GLUCOSE 92 150* 116*  BUN 11 18 16   CREATININE 0.75 0.80 0.63  CALCIUM 9.6 8.8 8.7   Liver Function Tests:  Recent Labs  01/23/14 1410  AST 26  ALT 17  ALKPHOS 82  BILITOT 0.4  PROT 7.1  ALBUMIN 3.8   CBC:  Recent Labs  02/21/13 1225  01/23/14 1410 01/31/14 0440 02/01/14 0448  WBC 5.7  < > 4.4 9.4 6.8  NEUTROABS 3.7  --   --   --   --   HGB 13.9  < > 12.6 9.1* 7.8*  HCT 41.3  < > 38.8 27.4* 23.3*  MCV 92.2  < > 87.6 86.2 87.6  PLT 198.0  < > 173 170 136*  < > = values in this interval not displayed. A1C: No components found with this basename: A1C,  Lipid Panel:  Recent Labs  02/21/13 1225  HDL 49.10   CBG:  Recent Labs  01/31/14 1711 01/31/14 2103 02/01/14 0744  GLUCAP 150* 138* 110*     ASSESSMENT/PLAN:  Osteoarthritis status post left total knee arthroplasty - for rehabilitation Anemia, acute blood loss - continue Niferex GERD - continue Protonix Constipation - continue Colace Insomnia - discontinue Sominex and start doxylamine succinate when necessary    CPT CODE: 63149  Tommey Barret Vargas - NP [facility] [phone]

## 2014-02-01 NOTE — Progress Notes (Signed)
Discharge summary sent to payer through [facility]  

## 2014-02-01 NOTE — Progress Notes (Signed)
SW gave facility packet to patient.   Patient discharged in wheelchair to family car to be transported to [place].   RN called report to    at [facility]. All questions answered.

## 2014-02-15 ENCOUNTER — Encounter: Payer: Self-pay | Admitting: Adult Health

## 2014-02-15 ENCOUNTER — Non-Acute Institutional Stay (SKILLED_NURSING_FACILITY): Payer: Medicare Other | Admitting: Adult Health

## 2014-02-15 DIAGNOSIS — D62 Acute posthemorrhagic anemia: Secondary | ICD-10-CM

## 2014-02-15 DIAGNOSIS — K5900 Constipation, unspecified: Secondary | ICD-10-CM

## 2014-02-15 DIAGNOSIS — K219 Gastro-esophageal reflux disease without esophagitis: Secondary | ICD-10-CM

## 2014-02-15 DIAGNOSIS — G4700 Insomnia, unspecified: Secondary | ICD-10-CM

## 2014-02-15 DIAGNOSIS — M1710 Unilateral primary osteoarthritis, unspecified knee: Secondary | ICD-10-CM

## 2014-02-15 DIAGNOSIS — M1712 Unilateral primary osteoarthritis, left knee: Secondary | ICD-10-CM

## 2014-02-15 NOTE — Progress Notes (Signed)
Patient ID: Tammy [name], female   DOB: Jul 28, 1941, 73 y.o.   MRN: [id number]              PROGRESS NOTE  DATE:   02/15/14  FACILITY: Nursing Home Location: [facility]  LEVEL OF CARE: SNF (31)  Acute Visit  CHIEF COMPLAINT:  Discharge Notes  HISTORY OF PRESENT ILLNESS: This is a 73 year old female who is for discharge home. She has been admitted to [facility] today, 02/01/14/ from [facility] with Osteoarthritis S/P Left total knee arthroplasty. Patient was admitted to this facility for short-term rehabilitation after the patient's recent hospitalization.  Patient has completed SNF rehabilitation and therapy has cleared the patient for discharge.   REASSESSMENT OF ONGOING PROBLEM(S):   ANEMIA: The anemia has been stable. The patient denies fatigue, melena or hematochezia. No complications from the medications currently being used. 6/15 hgb 7.8  GERD: pt's GERD is stable.  Denies ongoing heartburn, abd. Pain, nausea or vomiting.  Currently on a PPI & tolerates it without any adverse reactions.  INSOMNIA: The insomnia remains stable.  No complications noted from the medications presently being used. Patient denies ongoing insomnia, pain, hallucinations, delusions.   PAST MEDICAL HISTORY : Reviewed.  No changes/see problem list  CURRENT MEDICATIONS: Reviewed per MAR/see medication list  REVIEW OF SYSTEMS:  GENERAL: no change in appetite, no fatigue, no weight changes, no fever, chills or weakness RESPIRATORY: no cough, SOB, DOE, wheezing, hemoptysis CARDIAC: no chest pain, edema or palpitations GI: no abdominal pain, diarrhea, constipation, heart burn, nausea or vomiting  PHYSICAL EXAMINATION  GENERAL: no acute distress, normal body habitus NECK: supple, trachea midline, no neck masses, no thyroid tenderness, no thyromegaly LYMPHATICS: no LAN in the neck, no supraclavicular LAN RESPIRATORY: breathing is even & unlabored, BS CTAB CARDIAC: RRR,  no murmur,no extra heart sounds, no edema GI: abdomen soft, normal BS, no masses, no tenderness, no hepatomegaly, no splenomegaly EXTREMITIES: able to move all 4 extremities; ambulatory PSYCHIATRIC: the patient is alert & oriented to person, affect & behavior appropriate  LABS/RADIOLOGY: Labs reviewed: Basic Metabolic Panel:  Recent Labs  01/23/14 1410 01/31/14 0440 02/01/14 0448  NA 137 141 140  K 4.6 4.3 3.9  CL 99 105 104  CO2 27 24 26   GLUCOSE 92 150* 116*  BUN 11 18 16   CREATININE 0.75 0.80 0.63  CALCIUM 9.6 8.8 8.7   Liver Function Tests:  Recent Labs  01/23/14 1410  AST 26  ALT 17  ALKPHOS 82  BILITOT 0.4  PROT 7.1  ALBUMIN 3.8   CBC:  Recent Labs  02/21/13 1225  01/23/14 1410 01/31/14 0440 02/01/14 0448  WBC 5.7  < > 4.4 9.4 6.8  NEUTROABS 3.7  --   --   --   --   HGB 13.9  < > 12.6 9.1* 7.8*  HCT 41.3  < > 38.8 27.4* 23.3*  MCV 92.2  < > 87.6 86.2 87.6  PLT 198.0  < > 173 170 136*  < > = values in this interval not displayed. A1C: No components found with this basename: A1C,  Lipid Panel:  Recent Labs  02/21/13 1225  HDL 49.10   CBG:  Recent Labs  01/31/14 1711 01/31/14 2103 02/01/14 0744  GLUCAP 150* 138* 110*     ASSESSMENT/PLAN:  Osteoarthritis status post left total knee arthroplasty - had short-term rehabilitation Anemia, acute blood loss - stable GERD - continue Protonix Constipation - continue Colace Insomnia - discontinue Sominex  and start doxylamine succinate when necessary   I have filled out patient's discharge paperwork and written prescriptions.    Total discharge time: Less than 30 minutes  Discharge time involved coordination of the discharge process with Education officer, museum, nursing staff and therapy department.    CPT CODE: 93570  Seth Bake - NP [facility] [phone]

## 2014-03-06 ENCOUNTER — Other Ambulatory Visit: Payer: Self-pay | Admitting: Adult Health

## 2014-04-04 ENCOUNTER — Other Ambulatory Visit: Payer: Self-pay | Admitting: Family Medicine

## 2014-05-10 ENCOUNTER — Other Ambulatory Visit: Payer: Self-pay

## 2014-05-10 DIAGNOSIS — Z1231 Encounter for screening mammogram for malignant neoplasm of breast: Secondary | ICD-10-CM

## 2014-05-24 ENCOUNTER — Ambulatory Visit
Admission: RE | Admit: 2014-05-24 | Discharge: 2014-05-24 | Disposition: A | Discharge status: Home / Self Care | Payer: Medicare Other | Source: Ambulatory Visit

## 2014-05-24 DIAGNOSIS — Z1231 Encounter for screening mammogram for malignant neoplasm of breast: Secondary | ICD-10-CM

## 2014-06-14 ENCOUNTER — Other Ambulatory Visit: Payer: Self-pay | Admitting: Family Medicine

## 2014-07-28 ENCOUNTER — Other Ambulatory Visit: Payer: Self-pay | Admitting: Family Medicine

## 2014-07-28 MED ORDER — LORAZEPAM 0.5 MG PO TABS: 0.5000 mg | ORAL_TABLET | Freq: Two times a day (BID) | ORAL | Status: DC | PRN

## 2014-07-28 NOTE — Addendum Note (Signed)
Addended by: Agnes Lawrence on: 07/28/2014 01:02 PM   Modules accepted: Orders

## 2014-07-28 NOTE — Telephone Encounter (Signed)
Rx done and the pt was informed. 

## 2014-07-28 NOTE — Telephone Encounter (Signed)
I do not prescribe this medication for regular use, not on medication list. Who has been prescribing for her? Needs appt. If having anxiety and wants [place] to treat, but I do not typically prescribe this type of medication. If has been taking on a regular basis can provide taper as stopping suddenly is not recommended.

## 2014-07-28 NOTE — Telephone Encounter (Signed)
I called the pt and she stated this medication was given to her while in rehab at [facility] place after knee surgery by Dr Wynelle Link.  She stated she requested a medication at that time since she was having problems with her blood pressure and asked for a nerve pill and this is what they gave her.  She also stated she wants to have lab work done and I advised her to discuss this at her visit to see what Dr Maudie Mercury recommends and the only test she would need to fast for is to check her cholesterol. I scheduled the pt for an appt with Dr Maudie Mercury on 12/8.

## 2014-07-28 NOTE — Telephone Encounter (Signed)
If needs # 15 to get to appt ok to rx

## 2014-08-01 ENCOUNTER — Ambulatory Visit (INDEPENDENT_AMBULATORY_CARE_PROVIDER_SITE_OTHER): Payer: Medicare Other | Admitting: Family Medicine

## 2014-08-01 ENCOUNTER — Encounter: Payer: Self-pay | Admitting: Family Medicine

## 2014-08-01 VITALS — BP 152/92 | HR 78 | Temp 97.6°F | Ht 60.0 in | Wt 174.0 lb

## 2014-08-01 DIAGNOSIS — R7303 Prediabetes: Secondary | ICD-10-CM

## 2014-08-01 DIAGNOSIS — F329 Major depressive disorder, single episode, unspecified: Secondary | ICD-10-CM

## 2014-08-01 DIAGNOSIS — G8929 Other chronic pain: Secondary | ICD-10-CM

## 2014-08-01 DIAGNOSIS — E785 Hyperlipidemia, unspecified: Secondary | ICD-10-CM

## 2014-08-01 DIAGNOSIS — E669 Obesity, unspecified: Secondary | ICD-10-CM

## 2014-08-01 DIAGNOSIS — IMO0001 Reserved for inherently not codable concepts without codable children: Secondary | ICD-10-CM

## 2014-08-01 DIAGNOSIS — F32A Depression, unspecified: Secondary | ICD-10-CM

## 2014-08-01 DIAGNOSIS — R030 Elevated blood-pressure reading, without diagnosis of hypertension: Secondary | ICD-10-CM

## 2014-08-01 DIAGNOSIS — M549 Dorsalgia, unspecified: Secondary | ICD-10-CM

## 2014-08-01 DIAGNOSIS — F419 Anxiety disorder, unspecified: Secondary | ICD-10-CM

## 2014-08-01 DIAGNOSIS — R7309 Other abnormal glucose: Secondary | ICD-10-CM

## 2014-08-01 DIAGNOSIS — F418 Other specified anxiety disorders: Secondary | ICD-10-CM

## 2014-08-01 LAB — BASIC METABOLIC PANEL
BUN: 14 mg/dL (ref 6–23)
CO2: 24 meq/L (ref 19–32)
CREATININE: 0.9 mg/dL (ref 0.4–1.2)
Calcium: 9.4 mg/dL (ref 8.4–10.5)
Chloride: 105 mEq/L (ref 96–112)
GFR: 63.36 mL/min (ref >60.00)
Glucose, Bld: 101 mg/dL — ABNORMAL HIGH (ref 70–99)
Potassium: 5.2 mEq/L — ABNORMAL HIGH (ref 3.5–5.1)
Sodium: 138 mEq/L (ref 135–145)

## 2014-08-01 LAB — LDL CHOLESTEROL, DIRECT: Direct LDL: 132.8 mg/dL

## 2014-08-01 LAB — TSH: TSH: 3.03 u[IU]/mL (ref 0.35–4.50)

## 2014-08-01 LAB — HEMOGLOBIN A1C: Hgb A1c MFr Bld: 6 % (ref 4.6–6.5)

## 2014-08-01 LAB — LIPID PANEL
Cholesterol: 213 mg/dL — ABNORMAL HIGH (ref 0–200)
HDL: 43.9 mg/dL (ref >39.00)
NonHDL: 169.1
TRIGLYCERIDES: 213 mg/dL — AB (ref 0.0–149.0)
Total CHOL/HDL Ratio: 5
VLDL: 42.6 mg/dL — ABNORMAL HIGH (ref 0.0–40.0)

## 2014-08-01 MED ORDER — DULOXETINE HCL 30 MG PO CPEP: 30.0000 mg | ORAL_CAPSULE | Freq: Every day | ORAL | Status: DC

## 2014-08-01 NOTE — Progress Notes (Addendum)
HPI:  Acute visit for:  1) Anxiety and depression: -recently in nursing home after knee replacement and started on ativan for sleep and anxiety -reports when in rehab was not able to sleep and blood pressure was up and she was anxious related being in a new place and ask for a "nerve pill" -she was started on ativan, has been taking this every night and report helps her sleep and helps her knee pain -reports orthopedic doctor won't refill it for her any longer -she has chronic issues with depressed mood - recently worse related to 3 deaths in friends/family, sept is hard on her because was when her father died - cries a lot -denies SI -worries a lot about a lot of things -denies: SI, thoughts of self harm  2) HTN: -intermittently elevated BP in the past, always better on recheck -she refuses treatment for this and refuses today -denies: CP, SOB, DOE, HA, swelling  3) Seeing ortho for her bone health, reports they only advised calcium and checking tsh  Want labs today    ROS: See pertinent positives and negatives per HPI.  Past Medical History  Diagnosis Date  . Chicken pox   . Varicose vein   . Depression     mild, never on medication  . GERD (gastroesophageal reflux disease)   . Heart murmur     told in 2012 benign  . Hyperglycemia     borderline  . Anxiety   . Arthritis     knees and back  . High cholesterol     borderline, no meds  . Hypertension     high blood pressure readings, no meds  . DDD (degenerative disc disease), lumbar   . Scoliosis   . Osteoarthritis   . Hemorrhoids   . Diabetes mellitus without complication     borderline , diet controlled   . Deviated septum   . Rib pain on left side 01/20/2014     xray done seen by MD 01/20/14 in [facility]   . Bruised rib 01/18/2014    x-rayed    Past Surgical History  Procedure Laterality Date  . Tonsillectomy and adenoidectomy  1973  . Abdominal hysterectomy  1972  . Incision and drainage breast abscess  Bilateral 1966, 1967  . Ovarian cyst surgery Bilateral 1991  . Tubal ligation  1970  . Dental surgery  2007    left jaw bone graft, 2 implants on left, 2 implants on right  . Hemorroidectomy  1975  . Mass excision N/A 04/22/2013    Procedure: EXCISION MULTPILE soft tissue masses and abnormal skin lesion left thigh /left chest/ left posterior shoulder/ right flank ;  Surgeon: Harl Bowie, MD;  Location: [facility];  Service: General;  Laterality: N/A;  multiple sites:  left thigh, left shoulder, right flank, right upper arm  . Ovarian cyst surgery    . Colonoscopy  05/27/2013  . Hemorrhoid banding  06/03/13  . Total knee arthroplasty Right 07/25/2013    Procedure: RIGHT TOTAL KNEE ARTHROPLASTY;  Surgeon: Gearlean Alf, MD;  Location: [facility];  Service: Orthopedics;  Laterality: Right;  Left knee cortisone injection  . Total knee arthroplasty Left 01/30/2014    Procedure: LEFT TOTAL KNEE ARTHROPLASTY;  Surgeon: Gearlean Alf, MD;  Location: [facility];  Service: Orthopedics;  Laterality: Left;    Family History  Problem Relation Age of Onset  . Alcohol abuse Father   . Heart disease Father   . Arthritis Mother   .  Stroke Mother   . Hypertension Mother   . Diabetes Mother   . Breast cancer Maternal Grandmother   . Breast cancer Maternal Aunt   . Colon cancer Maternal Uncle   . Esophageal cancer Neg Hx   . Stomach cancer Neg Hx   . Rectal cancer Neg Hx     History   Social History  . Marital Status: Divorced    Spouse Name: N/A    Number of Children: N/A  . Years of Education: N/A   Social History Main Topics  . Smoking status: Never Smoker   . Smokeless tobacco: Never Used  . Alcohol Use: No     Comment: rare  . Drug Use: No  . Sexual Activity: None   Other Topics Concern  . 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 Take 0.5 twice a day, Disp: , Rfl: ;  etodolac (LODINE) 400 MG tablet, Take 400 mg by mouth 2 (two) times daily., Disp: , Rfl: 1;  methocarbamol (ROBAXIN) 500 MG tablet, Take 1 tablet (500 mg total) by mouth 3 (three) times daily as needed for muscle spasms., Disp: 60 tablet, Rfl: 0 oxyCODONE (OXY IR/ROXICODONE) 5 MG immediate release tablet, Take 1-2 tablets (5-10 mg total) by mouth every 3 (three) hours as needed for breakthrough pain., Disp: 80 tablet, Rfl: 0;  pantoprazole (PROTONIX) 40 MG tablet, TAKE 1 TABLET IN THE MORNING, Disp: 30 tablet, Rfl: 0;  traMADol (ULTRAM) 50 MG tablet, Take 1-2 tablets (50-100 mg total) by mouth every 6 (six) hours as needed for moderate pain., Disp: 60 tablet, Rfl: 0 DULoxetine (CYMBALTA) 30 MG capsule, Take 1 capsule (30 mg total) by mouth daily., Disp: 60 capsule, Rfl: 3;  LORazepam (ATIVAN) 0.5 MG tablet, Take 1 tablet (0.5 mg total) by mouth 2 (two) times daily as needed for anxiety. (Patient not taking: Reported on 08/01/2014), Disp: 15 tablet, Rfl: 0  EXAM:  Filed Vitals:   08/01/14 1048  BP: 152/92  Pulse: 78  Temp: 97.6 F (36.4 C)  Repeat BP: 142/80  Body mass index is 33.98 kg/(m^2).  GENERAL: vitals reviewed and listed above, alert, oriented, appears well hydrated and in no acute distress  HEENT: atraumatic, conjunttiva clear, no obvious abnormalities on inspection of external nose and ears  NECK: no obvious masses on inspection  LUNGS: clear to auscultation bilaterally, no wheezes, rales or rhonchi, good air movement  CV: HRRR, no peripheral edema  MS: moves all extremities without noticeable abnormality  PSYCH: pleasant and cooperative, no obvious depression or anxiety  ASSESSMENT AND  PLAN:  Discussed the following assessment and plan:  Elevated blood pressure - Plan: Basic metabolic panel -advised tx for this but she feels once depression treated this will improve and declined tx, discussed risks of untreated HTN - she opted to recheck at next visit  Prediabetes - Plan: Hemoglobin A1c  Hyperlipidemia, mild - Plan: Lipid Panel  Anxiety and depression - Plan: DULoxetine (CYMBALTA) 30 MG capsule,  TSH -discussed at length and most of her symptoms more depressive in nature  -discussed cymbalta or SSRI would likely be best option to treat depression/anxiety and chronic pain and she opted to try - she will call if cymbalta too expensive and will try lexapro instead. Discussed risks and advised not to stop suddenly. Advised of interactions with tramadol and she reports hardly taking the tramadol.  -Advised I do not recommend benzos for her symptoms nor first line for knee pain or sleep and that I do not prescribe benzos for regular use in adult patients -advised of significant risks with benzos -she reports she stopped this without any trouble a month ago and then ortho refilled once for her. Advised a slow wean off.  -Advised counseling but she does not wish to do this as feels has good support group and would not be helpful.  Chronic back pain -cymbalta may help   Obesity (BMI 30-39.9) -lifestyle recs   -Patient advised to return or notify a doctor immediately if symptoms worsen or persist or new concerns arise.  Patient Instructions  Please wean off the Ativan - every other night for a week or two and then a few nights per week for 1-2 weeks then stop  Start the cymbalta 30 mg daily for 1 week then increase to 60 mg daily  Follow up in 1 month     KIM, HANNAH R.

## 2014-08-01 NOTE — Patient Instructions (Signed)
Please wean off the Ativan - every other night for a week or two and then a few nights per week for 1-2 weeks then stop  Start the cymbalta 30 mg daily for 1 week then increase to 60 mg daily  Follow up in 1 month

## 2014-08-01 NOTE — Progress Notes (Signed)
Pre visit review using our clinic review tool, if applicable. No additional management support is needed unless otherwise documented below in the visit note. 

## 2014-08-03 ENCOUNTER — Telehealth: Payer: Self-pay | Admitting: *Deleted

## 2014-08-03 NOTE — Telephone Encounter (Signed)
I called the pt and informed her of the message below and she stated she does not understand why Dr Maudie Mercury will not give her Ativan as this is working for her and she will just look for another doctor.  States her orthopedic doctor feels Dr Maudie Mercury should prescribe this and to let Dr Maudie Mercury know she has a few more pills left and she is OK now.  Message forwarded to Dr Maudie Mercury.

## 2014-08-03 NOTE — Telephone Encounter (Signed)
I called the pt to discuss the lab results and she stated the Rx given for Cymbalta costs $66 and she would prefer an Rx for Ativan as this is cheaper.

## 2014-08-03 NOTE — Telephone Encounter (Signed)
As we discussed, I do not recommend ativan for her symptoms. I would advise trying a different medication such as lexapro or paxil if this is expensine another similar medication as we discussed if the cymbalta is too expensive. Can send this to the pharmacy if she wishes.

## 2014-08-07 NOTE — Telephone Encounter (Signed)
[phone] (home)   Called to see how she is doing and to let her know we really care about her and want to take great care of her. Discussed tx options for anxiety. She reports mild depressive and very mild anxiety symptoms. Again, she states she wants the ativan for her knee issue and that othopedic doc won't rx and that he said PCP could rx. Advised CBT, SSRI for the depression if medication needed. Benzo for rare panic if occurs - would rx 15 per year - but if needs more frequently advised she see psychiatrist. Advised against using benzo for knee pain. Discussed risks. She opted to call back if need 15 /per year plan. Opted against ssri for now. Thanked me for calling.

## 2014-08-29 ENCOUNTER — Ambulatory Visit: Payer: Medicare Other | Admitting: Family Medicine

## 2014-08-31 ENCOUNTER — Ambulatory Visit: Payer: Medicare Other | Admitting: Family Medicine

## 2014-11-20 DIAGNOSIS — M5136 Other intervertebral disc degeneration, lumbar region: Secondary | ICD-10-CM | POA: Diagnosis not present

## 2014-11-20 DIAGNOSIS — M5410 Radiculopathy, site unspecified: Secondary | ICD-10-CM | POA: Diagnosis not present

## 2014-12-21 ENCOUNTER — Other Ambulatory Visit: Payer: Self-pay | Admitting: Family Medicine

## 2015-01-15 DIAGNOSIS — M5410 Radiculopathy, site unspecified: Secondary | ICD-10-CM | POA: Diagnosis not present

## 2015-01-15 DIAGNOSIS — M5136 Other intervertebral disc degeneration, lumbar region: Secondary | ICD-10-CM | POA: Diagnosis not present

## 2015-01-18 ENCOUNTER — Other Ambulatory Visit: Payer: Self-pay | Admitting: Family Medicine

## 2015-02-24 ENCOUNTER — Other Ambulatory Visit: Payer: Self-pay | Admitting: Family Medicine

## 2015-03-21 DIAGNOSIS — L259 Unspecified contact dermatitis, unspecified cause: Secondary | ICD-10-CM | POA: Diagnosis not present

## 2015-04-21 ENCOUNTER — Other Ambulatory Visit: Payer: Self-pay | Admitting: Family Medicine

## 2015-05-02 DIAGNOSIS — M5136 Other intervertebral disc degeneration, lumbar region: Secondary | ICD-10-CM | POA: Diagnosis not present

## 2015-06-25 ENCOUNTER — Telehealth: Payer: Self-pay | Admitting: Family Medicine

## 2015-06-25 NOTE — Telephone Encounter (Signed)
Andria Frames from [facility] [phone] is needing Last office notes, A1C, and current medication list faxed [phone].

## 2015-07-03 DIAGNOSIS — E119 Type 2 diabetes mellitus without complications: Secondary | ICD-10-CM | POA: Diagnosis not present

## 2015-07-03 DIAGNOSIS — H25813 Combined forms of age-related cataract, bilateral: Secondary | ICD-10-CM | POA: Diagnosis not present

## 2015-07-04 LAB — HM DIABETES EYE EXAM

## 2015-07-05 ENCOUNTER — Encounter: Payer: Self-pay | Admitting: Family Medicine

## 2015-10-03 DIAGNOSIS — Z79891 Long term (current) use of opiate analgesic: Secondary | ICD-10-CM | POA: Diagnosis not present

## 2015-10-03 DIAGNOSIS — M5416 Radiculopathy, lumbar region: Secondary | ICD-10-CM | POA: Diagnosis not present

## 2015-10-03 DIAGNOSIS — M5136 Other intervertebral disc degeneration, lumbar region: Secondary | ICD-10-CM | POA: Diagnosis not present

## 2015-10-03 DIAGNOSIS — G894 Chronic pain syndrome: Secondary | ICD-10-CM | POA: Diagnosis not present

## 2015-11-13 ENCOUNTER — Telehealth: Payer: Self-pay | Admitting: *Deleted

## 2015-11-13 NOTE — Telephone Encounter (Signed)
I left a detailed message on the pts voicemail stating per Dr Maudie Mercury she received the in-home report from [facility] and she is due for her physical and to call the office to schedule an appt.

## 2016-03-25 DIAGNOSIS — G894 Chronic pain syndrome: Secondary | ICD-10-CM | POA: Diagnosis not present

## 2016-03-25 DIAGNOSIS — M5136 Other intervertebral disc degeneration, lumbar region: Secondary | ICD-10-CM | POA: Diagnosis not present

## 2016-03-25 DIAGNOSIS — Z79891 Long term (current) use of opiate analgesic: Secondary | ICD-10-CM | POA: Diagnosis not present

## 2016-03-27 DIAGNOSIS — Z96652 Presence of left artificial knee joint: Secondary | ICD-10-CM | POA: Diagnosis not present

## 2016-03-27 DIAGNOSIS — Z471 Aftercare following joint replacement surgery: Secondary | ICD-10-CM | POA: Diagnosis not present

## 2016-03-27 DIAGNOSIS — Z96651 Presence of right artificial knee joint: Secondary | ICD-10-CM | POA: Diagnosis not present

## 2016-03-27 DIAGNOSIS — Z96653 Presence of artificial knee joint, bilateral: Secondary | ICD-10-CM | POA: Diagnosis not present

## 2016-05-07 DIAGNOSIS — T7840XA Allergy, unspecified, initial encounter: Secondary | ICD-10-CM | POA: Diagnosis not present

## 2016-05-27 ENCOUNTER — Telehealth: Payer: Self-pay | Admitting: Family Medicine

## 2016-05-27 NOTE — Telephone Encounter (Signed)
I called Tammy [name] and informed her of the message below and she stated the pt told her she is only seeing Dr Maudie Mercury.  I called the pt and left a detailed message with the information below as well and asked that she call the office to schedule an appt.

## 2016-05-27 NOTE — Telephone Encounter (Signed)
Pt bp today 172/98 , heart rate 66 pt is  a symptomatic and spilling 2+ in urine. Pt last seen dr Maudie Mercury in 2015

## 2016-05-27 NOTE — Telephone Encounter (Signed)
I am not sure I understand this call/question? Looks like she has not returned to our office though told to f/u in 1 month at last visit,  and not seen in years. Does she have another PCP or see specialist for her BP? If so, advise she see managing physician or PCP If no other PCP and wishes [place] to eval please schedule 30 minute appt. Thanks.

## 2016-05-30 DIAGNOSIS — H04123 Dry eye syndrome of bilateral lacrimal glands: Secondary | ICD-10-CM | POA: Diagnosis not present

## 2016-06-10 DIAGNOSIS — H2513 Age-related nuclear cataract, bilateral: Secondary | ICD-10-CM | POA: Diagnosis not present

## 2016-06-17 DIAGNOSIS — J310 Chronic rhinitis: Secondary | ICD-10-CM | POA: Diagnosis not present

## 2016-06-17 DIAGNOSIS — H903 Sensorineural hearing loss, bilateral: Secondary | ICD-10-CM | POA: Diagnosis not present

## 2016-06-17 DIAGNOSIS — J342 Deviated nasal septum: Secondary | ICD-10-CM | POA: Diagnosis not present

## 2016-06-17 DIAGNOSIS — R42 Dizziness and giddiness: Secondary | ICD-10-CM | POA: Diagnosis not present

## 2016-06-17 DIAGNOSIS — K219 Gastro-esophageal reflux disease without esophagitis: Secondary | ICD-10-CM | POA: Diagnosis not present

## 2016-08-05 DIAGNOSIS — K219 Gastro-esophageal reflux disease without esophagitis: Secondary | ICD-10-CM | POA: Diagnosis not present

## 2016-08-05 DIAGNOSIS — R490 Dysphonia: Secondary | ICD-10-CM | POA: Diagnosis not present

## 2016-09-02 DIAGNOSIS — G894 Chronic pain syndrome: Secondary | ICD-10-CM | POA: Diagnosis not present

## 2016-09-02 DIAGNOSIS — Z79891 Long term (current) use of opiate analgesic: Secondary | ICD-10-CM | POA: Diagnosis not present

## 2016-09-02 DIAGNOSIS — M5136 Other intervertebral disc degeneration, lumbar region: Secondary | ICD-10-CM | POA: Diagnosis not present

## 2016-11-04 DIAGNOSIS — J310 Chronic rhinitis: Secondary | ICD-10-CM | POA: Diagnosis not present

## 2016-11-04 DIAGNOSIS — J343 Hypertrophy of nasal turbinates: Secondary | ICD-10-CM | POA: Diagnosis not present

## 2016-11-04 DIAGNOSIS — J342 Deviated nasal septum: Secondary | ICD-10-CM | POA: Diagnosis not present

## 2016-11-11 DIAGNOSIS — M5136 Other intervertebral disc degeneration, lumbar region: Secondary | ICD-10-CM | POA: Diagnosis not present

## 2016-11-14 ENCOUNTER — Other Ambulatory Visit: Payer: Self-pay | Admitting: Otolaryngology

## 2016-12-11 ENCOUNTER — Encounter (HOSPITAL_BASED_OUTPATIENT_CLINIC_OR_DEPARTMENT_OTHER): Payer: Self-pay | Admitting: *Deleted

## 2016-12-16 ENCOUNTER — Encounter (HOSPITAL_BASED_OUTPATIENT_CLINIC_OR_DEPARTMENT_OTHER)
Admission: RE | Disposition: A | Discharge status: Home / Self Care | Payer: Self-pay | Source: Ambulatory Visit | Attending: Otolaryngology

## 2016-12-16 ENCOUNTER — Encounter (HOSPITAL_BASED_OUTPATIENT_CLINIC_OR_DEPARTMENT_OTHER): Payer: Self-pay | Admitting: Anesthesiology

## 2016-12-16 ENCOUNTER — Ambulatory Visit (HOSPITAL_BASED_OUTPATIENT_CLINIC_OR_DEPARTMENT_OTHER): Payer: Medicare Other | Admitting: Anesthesiology

## 2016-12-16 ENCOUNTER — Ambulatory Visit (HOSPITAL_BASED_OUTPATIENT_CLINIC_OR_DEPARTMENT_OTHER)
Admission: RE | Admit: 2016-12-16 | Discharge: 2016-12-16 | Disposition: A | Discharge status: Home / Self Care | Payer: Medicare Other | Source: Ambulatory Visit | Attending: Otolaryngology | Admitting: Otolaryngology

## 2016-12-16 DIAGNOSIS — E785 Hyperlipidemia, unspecified: Secondary | ICD-10-CM | POA: Diagnosis not present

## 2016-12-16 DIAGNOSIS — Z79899 Other long term (current) drug therapy: Secondary | ICD-10-CM | POA: Diagnosis not present

## 2016-12-16 DIAGNOSIS — R490 Dysphonia: Secondary | ICD-10-CM | POA: Insufficient documentation

## 2016-12-16 DIAGNOSIS — Z791 Long term (current) use of non-steroidal anti-inflammatories (NSAID): Secondary | ICD-10-CM | POA: Insufficient documentation

## 2016-12-16 DIAGNOSIS — J342 Deviated nasal septum: Secondary | ICD-10-CM | POA: Insufficient documentation

## 2016-12-16 DIAGNOSIS — K219 Gastro-esophageal reflux disease without esophagitis: Secondary | ICD-10-CM | POA: Insufficient documentation

## 2016-12-16 DIAGNOSIS — J3489 Other specified disorders of nose and nasal sinuses: Secondary | ICD-10-CM | POA: Diagnosis not present

## 2016-12-16 DIAGNOSIS — I10 Essential (primary) hypertension: Secondary | ICD-10-CM | POA: Diagnosis not present

## 2016-12-16 DIAGNOSIS — K5900 Constipation, unspecified: Secondary | ICD-10-CM | POA: Diagnosis not present

## 2016-12-16 DIAGNOSIS — J384 Edema of larynx: Secondary | ICD-10-CM | POA: Diagnosis not present

## 2016-12-16 HISTORY — DX: Prediabetes: R73.03

## 2016-12-16 HISTORY — PX: SEPTOPLASTY: SHX2393

## 2016-12-16 SURGERY — SEPTOPLASTY, NOSE
Anesthesia: General | Site: Nose | Laterality: Bilateral

## 2016-12-16 MED ORDER — PROMETHAZINE HCL 25 MG/ML IJ SOLN: 6.2500 mg | INTRAMUSCULAR | Status: DC | PRN

## 2016-12-16 MED ORDER — CLINDAMYCIN PHOSPHATE 600 MG/50ML IV SOLN
INTRAVENOUS | Status: DC | PRN
  Administered 2016-12-16: 600 mg via INTRAVENOUS

## 2016-12-16 MED ORDER — LIDOCAINE-EPINEPHRINE 1 %-1:100000 IJ SOLN
INTRAMUSCULAR | Status: DC | PRN
  Administered 2016-12-16: 3 mL

## 2016-12-16 MED ORDER — OXYMETAZOLINE HCL 0.05 % NA SOLN
NASAL | Status: AC
  Filled 2016-12-16: qty 15

## 2016-12-16 MED ORDER — ONDANSETRON HCL 4 MG/2ML IJ SOLN
INTRAMUSCULAR | Status: DC | PRN
  Administered 2016-12-16: 4 mg via INTRAVENOUS

## 2016-12-16 MED ORDER — LIDOCAINE 2% (20 MG/ML) 5 ML SYRINGE
INTRAMUSCULAR | Status: AC
  Filled 2016-12-16: qty 5

## 2016-12-16 MED ORDER — DEXAMETHASONE SODIUM PHOSPHATE 10 MG/ML IJ SOLN
INTRAMUSCULAR | Status: AC
  Filled 2016-12-16: qty 1

## 2016-12-16 MED ORDER — SUCCINYLCHOLINE CHLORIDE 200 MG/10ML IV SOSY
PREFILLED_SYRINGE | INTRAVENOUS | Status: AC
  Filled 2016-12-16: qty 10

## 2016-12-16 MED ORDER — FENTANYL CITRATE (PF) 100 MCG/2ML IJ SOLN
INTRAMUSCULAR | Status: AC
  Filled 2016-12-16: qty 2

## 2016-12-16 MED ORDER — MUPIROCIN 2 % EX OINT
TOPICAL_OINTMENT | CUTANEOUS | Status: AC
  Filled 2016-12-16: qty 22

## 2016-12-16 MED ORDER — MUPIROCIN 2 % EX OINT
TOPICAL_OINTMENT | CUTANEOUS | Status: DC | PRN
  Administered 2016-12-16: 1 via TOPICAL

## 2016-12-16 MED ORDER — CLINDAMYCIN HCL 300 MG PO CAPS: 300.0000 mg | ORAL_CAPSULE | Freq: Three times a day (TID) | ORAL | 0 refills | Status: AC

## 2016-12-16 MED ORDER — MIDAZOLAM HCL 2 MG/2ML IJ SOLN: 1.0000 mg | INTRAMUSCULAR | Status: DC | PRN

## 2016-12-16 MED ORDER — HYDROMORPHONE HCL 1 MG/ML IJ SOLN: 0.2500 mg | INTRAMUSCULAR | Status: DC | PRN

## 2016-12-16 MED ORDER — CLINDAMYCIN PHOSPHATE 600 MG/50ML IV SOLN
INTRAVENOUS | Status: AC
  Filled 2016-12-16: qty 50

## 2016-12-16 MED ORDER — OXYCODONE HCL 5 MG/5ML PO SOLN: 5.0000 mg | Freq: Once | ORAL | Status: DC | PRN

## 2016-12-16 MED ORDER — MEPERIDINE HCL 25 MG/ML IJ SOLN: 6.2500 mg | INTRAMUSCULAR | Status: DC | PRN

## 2016-12-16 MED ORDER — EPHEDRINE SULFATE 50 MG/ML IJ SOLN
INTRAMUSCULAR | Status: DC | PRN
  Administered 2016-12-16 (×3): 10 mg via INTRAVENOUS

## 2016-12-16 MED ORDER — OXYMETAZOLINE HCL 0.05 % NA SOLN
NASAL | Status: DC | PRN
  Administered 2016-12-16: 1 via TOPICAL

## 2016-12-16 MED ORDER — DEXAMETHASONE SODIUM PHOSPHATE 4 MG/ML IJ SOLN
INTRAMUSCULAR | Status: DC | PRN
  Administered 2016-12-16: 10 mg via INTRAVENOUS

## 2016-12-16 MED ORDER — SUCCINYLCHOLINE CHLORIDE 20 MG/ML IJ SOLN
INTRAMUSCULAR | Status: DC | PRN
  Administered 2016-12-16: 100 mg via INTRAVENOUS

## 2016-12-16 MED ORDER — LACTATED RINGERS IV SOLN
INTRAVENOUS | Status: DC
  Administered 2016-12-16 (×2): via INTRAVENOUS

## 2016-12-16 MED ORDER — ONDANSETRON HCL 4 MG/2ML IJ SOLN
INTRAMUSCULAR | Status: AC
  Filled 2016-12-16: qty 2

## 2016-12-16 MED ORDER — LIDOCAINE-EPINEPHRINE 1 %-1:100000 IJ SOLN
INTRAMUSCULAR | Status: AC
  Filled 2016-12-16: qty 1

## 2016-12-16 MED ORDER — OXYCODONE HCL 5 MG PO TABS: 5.0000 mg | ORAL_TABLET | Freq: Once | ORAL | Status: DC | PRN

## 2016-12-16 MED ORDER — LIDOCAINE 2% (20 MG/ML) 5 ML SYRINGE
INTRAMUSCULAR | Status: DC | PRN
  Administered 2016-12-16: 80 mg via INTRAVENOUS

## 2016-12-16 MED ORDER — PROPOFOL 10 MG/ML IV BOLUS
INTRAVENOUS | Status: DC | PRN
  Administered 2016-12-16: 150 mg via INTRAVENOUS

## 2016-12-16 MED ORDER — FENTANYL CITRATE (PF) 100 MCG/2ML IJ SOLN
50.0000 ug | INTRAMUSCULAR | Status: DC | PRN
  Administered 2016-12-16 (×2): 50 ug via INTRAVENOUS

## 2016-12-16 MED ORDER — SCOPOLAMINE 1 MG/3DAYS TD PT72: 1.0000 | MEDICATED_PATCH | Freq: Once | TRANSDERMAL | Status: DC | PRN

## 2016-12-16 SURGICAL SUPPLY — 32 items
ATTRACTOMAT 16X20 MAGNETIC DRP (DRAPES) IMPLANT
CANISTER SUCT 1200ML W/VALVE (MISCELLANEOUS) ×3 IMPLANT
COAGULATOR SUCT 8FR VV (MISCELLANEOUS) IMPLANT
DECANTER SPIKE VIAL GLASS SM (MISCELLANEOUS) IMPLANT
DRSG NASOPORE 8CM (GAUZE/BANDAGES/DRESSINGS) IMPLANT
DRSG TELFA 3X8 NADH (GAUZE/BANDAGES/DRESSINGS) IMPLANT
ELECT REM PT RETURN 9FT ADLT (ELECTROSURGICAL) ×3
ELECTRODE REM PT RTRN 9FT ADLT (ELECTROSURGICAL) ×2 IMPLANT
GLOVE BIO SURGEON STRL SZ 6.5 (GLOVE) ×3 IMPLANT
GLOVE BIO SURGEON STRL SZ7.5 (GLOVE) ×3 IMPLANT
GLOVE BIOGEL PI IND STRL 7.0 (GLOVE) ×2 IMPLANT
GLOVE BIOGEL PI INDICATOR 7.0 (GLOVE) ×1
GOWN STRL REUS W/ TWL LRG LVL3 (GOWN DISPOSABLE) ×4 IMPLANT
GOWN STRL REUS W/TWL LRG LVL3 (GOWN DISPOSABLE) ×2
NEEDLE HYPO 25X1 1.5 SAFETY (NEEDLE) ×3 IMPLANT
NS IRRIG 1000ML POUR BTL (IV SOLUTION) ×3 IMPLANT
PACK BASIN DAY SURGERY FS (CUSTOM PROCEDURE TRAY) ×3 IMPLANT
PACK ENT DAY SURGERY (CUSTOM PROCEDURE TRAY) ×3 IMPLANT
SLEEVE SCD COMPRESS KNEE MED (MISCELLANEOUS) ×3 IMPLANT
SOLUTION BUTLER CLEAR DIP (MISCELLANEOUS) IMPLANT
SPLINT NASAL AIRWAY SILICONE (MISCELLANEOUS) ×3 IMPLANT
SPONGE GAUZE 2X2 8PLY STRL LF (GAUZE/BANDAGES/DRESSINGS) ×3 IMPLANT
SPONGE NEURO XRAY DETECT 1X3 (DISPOSABLE) ×3 IMPLANT
SUT CHROMIC 4 0 P 3 18 (SUTURE) ×3 IMPLANT
SUT PLAIN 4 0 ~~LOC~~ 1 (SUTURE) ×3 IMPLANT
SUT PROLENE 3 0 PS 2 (SUTURE) ×3 IMPLANT
SUT VIC AB 4-0 P-3 18XBRD (SUTURE) IMPLANT
SUT VIC AB 4-0 P3 18 (SUTURE)
TOWEL OR 17X24 6PK STRL BLUE (TOWEL DISPOSABLE) ×3 IMPLANT
TUBE SALEM SUMP 12R W/ARV (TUBING) IMPLANT
TUBE SALEM SUMP 16 FR W/ARV (TUBING) IMPLANT
YANKAUER SUCT BULB TIP NO VENT (SUCTIONS) IMPLANT

## 2016-12-16 NOTE — Transfer of Care (Signed)
Immediate Anesthesia Transfer of Care Note  Patient: Tammy [name]  Procedure(s) Performed: Procedure(s): SEPTOPLASTY (Bilateral)  Patient Location: PACU  Anesthesia Type:General  Level of Consciousness: awake and alert   Airway & Oxygen Therapy: Patient Spontanous Breathing and Patient connected to face mask oxygen  Post-op Assessment: Report given to RN and Post -op Vital signs reviewed and stable  Post vital signs: Reviewed and stable  Last Vitals:  Vitals:   12/16/16 1202 12/16/16 1203  BP: (!) 149/85   Pulse: 81 81  Resp: (!) 23 16  Temp:      Last Pain:  Vitals:   12/16/16 0945  TempSrc: Oral         Complications: No apparent anesthesia complications

## 2016-12-16 NOTE — Discharge Instructions (Addendum)
POSTOPERATIVE INSTRUCTIONS FOR PATIENTS HAVING NASAL OR SINUS OPERATIONS °ACTIVITY: Restrict activity at home for the first two days, resting as much as possible. Light activity is best. You may usually return to work within a week. You should refrain from nose blowing, strenuous activity, or heavy lifting greater than 20lbs for a total of three weeks after your operation.  If sneezing cannot be avoided, sneeze with your mouth open. °DISCOMFORT: You may experience a dull headache and pressure along with nasal congestion and discharge. These symptoms may be worse during the first week after the operation but may last as long as two to four weeks.  Please take Tylenol or the pain medication that has been prescribed for you. Do not take aspirin or aspirin containing medications since they may cause bleeding.  You may experience symptoms of post nasal drainage, nasal congestion, headaches and fatigue for two or three months after your operation.  °BLEEDING: You may have some blood tinged nasal drainage for approximately two weeks after the operation.  The discharge will be worse for the first week.  Please call our office at [phone] or go to the nearest hospital emergency room if you experience any of the following: heavy, bright red blood from your nose or mouth that lasts longer than ten minutes or coughing up or vomiting bright red blood or blood clots. °GENERAL CONSIDERATIONS: °1. A gauze dressing will be placed on your upper lip to absorb any drainage after the operation. You may need to change this several times a day.  If you do not have very much drainage, you may remove the dressing.  Remember that you may gently wipe your nose with a tissue and sniff in, but DO NOT blow your nose. °2. Please keep all of your postoperative appointments.  Your final results after the operation will depend on proper follow-up.  The initial visit is usually four to seven days after the operation.  During this visit, the  remaining nasal packing and internal septal splints will be removed.  Your nasal and sinus cavities will be cleaned.  During the second visit, your nasal and sinus cavities will be cleaned again. Have someone drive you to your first two postoperative appointments. We suggest that you take your prescribed pain medication about ½ hour prior to each of these two appointments.  °3. How you care for your nose after the operation will influence the results that you obtain.  You should follow all directions, take your medication as prescribed, and call our office [phone] with any problems or questions. °4. You may be more comfortable sleeping with your head elevated on two pillows. °5. Do not take any medications that we have not prescribed or recommended. °WARNING SIGNS: if any of the following should occur, please call our office: °1. Bright red bleeding which lasts more than 10 minutes. °2. Persistent fever greater than 102F. °3. Persistent vomiting. °4. Severe and constant pain that is not relieved by prescribed pain medication. °5. Trauma to the nose. °6. Rash or unusual side effects from any medicines. ° ° ° °Post Anesthesia Home Care Instructions ° °Activity: °Get plenty of rest for the remainder of the day. A responsible individual must stay with you for 24 hours following the procedure.  °For the next 24 hours, DO NOT: °-Drive a car °-Operate machinery °-Drink alcoholic beverages °-Take any medication unless instructed by your physician °-Make any legal decisions or sign important papers. ° °Meals: °Start with liquid foods such as gelatin or soup. Progress   to regular foods as tolerated. Avoid greasy, spicy, heavy foods. If nausea and/or vomiting occur, drink only clear liquids until the nausea and/or vomiting subsides. Call your physician if vomiting continues.  Special Instructions/Symptoms: Your throat may feel dry or sore from the anesthesia or the breathing tube placed in your throat during surgery.  If this causes discomfort, gargle with warm salt water. The discomfort should disappear within 24 hours.  If you had a scopolamine patch placed behind your ear for the management of post- operative nausea and/or vomiting:  1. The medication in the patch is effective for 72 hours, after which it should be removed.  Wrap patch in a tissue and discard in the trash. Wash hands thoroughly with soap and water. 2. You may remove the patch earlier than 72 hours if you experience unpleasant side effects which may include dry mouth, dizziness or visual disturbances. 3. Avoid touching the patch. Wash your hands with soap and water after contact with the patch.   Call your surgeon if you experience:   1.  Fever over 101.0. 2.  Inability to urinate. 3.  Nausea and/or vomiting. 4.  Extreme swelling or bruising at the surgical site. 5.  Continued bleeding from the incision. 6.  Increased pain, redness or drainage from the incision. 7.  Problems related to your pain medication. 8.  Any problems and/or concerns

## 2016-12-16 NOTE — Anesthesia Procedure Notes (Signed)
Procedure Name: Intubation Date/Time: 12/16/2016 11:12 AM Performed by: Lieutenant Diego Pre-anesthesia Checklist: Patient identified, Emergency Drugs available, Suction available and Patient being monitored Patient Re-evaluated:Patient Re-evaluated prior to inductionOxygen Delivery Method: Circle system utilized Preoxygenation: Pre-oxygenation with 100% oxygen Intubation Type: IV induction Ventilation: Mask ventilation without difficulty Laryngoscope Size: Miller and 2 Grade View: Grade I Tube type: Oral Tube size: 7.0 mm Number of attempts: 1 Airway Equipment and Method: Stylet and Oral airway Placement Confirmation: ETT inserted through vocal cords under direct vision,  positive ETCO2 and breath sounds checked- equal and bilateral Secured at: 23 cm Tube secured with: Tape Dental Injury: Teeth and Oropharynx as per pre-operative assessment

## 2016-12-16 NOTE — H&P (Signed)
Cc: Chronic nasal obstruction  HPI: The patient is a 76 y/o female who returns today for follow up evaluation of multiple complaints. She was last seen 3 months ago. At that time, the patient's hoarseness had improved with the addition of ranitidine to her nexium regimen. Minimal posterior laryngeal edema was noted on endoscopy exam. The patient was advised to discontinue ranitidine. She also has a history of chronic rhinitis with bidirectional septal deviation and chronic postnasal drip. Anterior nasal deformity was also noted. The patient was treated with Flonase and Atrovent but stopped the medications because she felt they were too drying. The patient returns today complaining of right sided throat and ear discomfort. Her hoarseness has returned since she stopped ranitidine. She also complains of persistent anterior nasal deformity. No other ENT, GI, or respiratory issue noted since the last visit.   Exam: The nasal cavities were decongested and anesthetised with a combination of oxymetazoline and 4% lidocaine solution. The flexible scope was inserted into the right nasal cavity and advanced towards the nasopharynx. Visualized mucosa over the turbinates and septum wasedematous. Significant anterior septal deviation. The nasopharynx was clear. Oropharyngeal walls were symmetric and mobile without lesion, mass, or edema. Hypopharynx was also without lesion or edema. Larynx was mobile without lesions. No lesions or asymmetry in the supraglottic larynx. Arytenoid mucosa was moderately edematous. True vocal folds were pale yellow and edematous but without mass or lesion. Base of tongue was within normal limits. The patient tolerated the procedure well.   Assessment: 1. Moderate posterior laryngeal edema is noted with recurrent hoarseness.  2. Persistent septal deviation resulting in deformity of the left nasal opening and chronic obstruction.  Plan: 1. Endoscopy findings are reviewed with the patient.   2. Resume ranitidine qhs in addition to Nexium. 3. Treatment options for her septal deviation are discussed. The risks, benefits, details, and alternatives of the septoplasty procedure are discussed with the patient. Questions are invited and answered. The patient would like to proceed with surgical intervention.  4.  The procedure will be schedule in accordance with the patient's schedule.

## 2016-12-16 NOTE — Anesthesia Preprocedure Evaluation (Signed)
Anesthesia Evaluation  Patient identified by MRN, date of birth, ID band Patient awake    Reviewed: Allergy & Precautions, H&P , NPO status , Patient's Chart, lab work & pertinent test results  Airway Mallampati: II  TM Distance: >3 FB Neck ROM: Full    Dental no notable dental hx.    Pulmonary neg pulmonary ROS,    Pulmonary exam normal breath sounds clear to auscultation       Cardiovascular hypertension, Normal cardiovascular exam+ Valvular Problems/Murmurs  Rhythm:Regular Rate:Normal  Hypertension   high blood pressure readings, no meds    Neuro/Psych PSYCHIATRIC DISORDERS Anxiety Depression negative neurological ROS     GI/Hepatic negative GI ROS, Neg liver ROS, GERD  ,  Endo/Other  diabetes  Renal/GU negative Renal ROS  negative genitourinary   Musculoskeletal  (+) Arthritis ,   Abdominal   Peds negative pediatric ROS (+)  Hematology negative hematology ROS (+) anemia ,   Anesthesia Other Findings   Reproductive/Obstetrics negative OB ROS                             Anesthesia Physical  Anesthesia Plan  ASA: II  Anesthesia Plan: General   Post-op Pain Management:    Induction: Intravenous  Airway Management Planned: Oral ETT  Additional Equipment:   Intra-op Plan:   Post-operative Plan: Extubation in OR  Informed Consent: I have reviewed the patients History and Physical, chart, labs and discussed the procedure including the risks, benefits and alternatives for the proposed anesthesia with the patient or authorized representative who has indicated his/her understanding and acceptance.   Dental advisory given  Plan Discussed with: CRNA  Anesthesia Plan Comments:         Anesthesia Quick Evaluation

## 2016-12-16 NOTE — Anesthesia Postprocedure Evaluation (Signed)
Anesthesia Post Note  Patient: Tammy [name]  Procedure(s) Performed: Procedure(s) (LRB): SEPTOPLASTY (Bilateral)  Patient location during evaluation: PACU Anesthesia Type: General Level of consciousness: sedated and patient cooperative Pain management: pain level controlled Vital Signs Assessment: post-procedure vital signs reviewed and stable Respiratory status: spontaneous breathing Cardiovascular status: stable Anesthetic complications: no       Last Vitals:  Vitals:   12/16/16 1237 12/16/16 1256  BP:  (!) 178/99  Pulse: 80 82  Resp: 16 18  Temp:  36.6 C    Last Pain:  Vitals:   12/16/16 1256  TempSrc: Oral  PainSc: 0-No pain                 Nolon Nations

## 2016-12-16 NOTE — Op Note (Signed)
DATE OF PROCEDURE: 12/16/2016  OPERATIVE REPORT   SURGEON: Leta Baptist, MD   PREOPERATIVE DIAGNOSES:  1. Severe nasal septal deviation.  2. Chronic nasal obstruction.  POSTOPERATIVE DIAGNOSES:  1. Severe nasal septal deviation.  2. Chronic nasal obstruction.  PROCEDURE PERFORMED:  1. Septoplasty.   ANESTHESIA: General endotracheal tube anesthesia.   COMPLICATIONS: None.   ESTIMATED BLOOD LOSS: Less than 20 mL.   INDICATION FOR PROCEDURE: Tammy [name] is a 76 y.o. female with a history of chronic nasal obstruction and septal deformity. The patient was treated with antihistamine, decongestant, and steroid nasal spray. However, the patient continues to be symptomatic. On examination, the patient was noted to have significant nasal septal deviation, causing significant nasal obstruction. Based on the above findings, the decision was made for the patient to undergo the above-stated procedure. The risks, benefits, alternatives, and details of the procedure were discussed with the patient. Questions were invited and answered. Informed consent was obtained.   DESCRIPTION OF PROCEDURE: The patient was taken to the operating room and placed supine on the operating table. General endotracheal tube anesthesia was administered by the anesthesiologist. The patient was positioned, and prepped and draped in the standard fashion for nasal surgery. Pledgets soaked with Afrin were placed in both nasal cavities for decongestion. The pledgets were subsequently removed. The above mentioned severe septal deviation was again noted. 1% lidocaine with 1:100,000 epinephrine was injected onto the nasal septum bilaterally. A hemitransfixion incision was made on the left side. The mucosal flap was carefully elevated on the left side. A cartilaginous incision was made 1 cm superior to the caudal margin of the nasal septum. Mucosal flap was also elevated on the right side in the similar fashion. It should be noted that  due to the severe septal deviation, the deviated portion of the cartilaginous and bony septum had to be removed in piecemeal fashion. Once the deviated portions were removed, a straight midline septum was achieved. The septum was then quilted with 4-0 plain gut sutures. The hemitransfixion incision was closed with interrupted 4-0 chromic sutures.   The care of the patient was turned over to the anesthesiologist. The patient was awakened from anesthesia without difficulty. The patient was extubated and transferred to the recovery room in good condition.   OPERATIVE FINDINGS: Severe nasal septal deviation.  SPECIMEN: None.   FOLLOWUP CARE: The patient be discharged home once she is awake and alert.  The patient will follow up in my office in approximately 1 week for splint removal.   Tarrin Lebow Raynelle Bring, MD

## 2016-12-17 ENCOUNTER — Encounter (HOSPITAL_BASED_OUTPATIENT_CLINIC_OR_DEPARTMENT_OTHER): Payer: Self-pay | Admitting: Otolaryngology

## 2017-01-14 DIAGNOSIS — M5416 Radiculopathy, lumbar region: Secondary | ICD-10-CM | POA: Diagnosis not present

## 2017-01-14 DIAGNOSIS — M5136 Other intervertebral disc degeneration, lumbar region: Secondary | ICD-10-CM | POA: Diagnosis not present

## 2017-01-14 DIAGNOSIS — Z79891 Long term (current) use of opiate analgesic: Secondary | ICD-10-CM | POA: Diagnosis not present

## 2017-01-14 DIAGNOSIS — G894 Chronic pain syndrome: Secondary | ICD-10-CM | POA: Diagnosis not present

## 2017-01-29 DIAGNOSIS — Z96651 Presence of right artificial knee joint: Secondary | ICD-10-CM | POA: Diagnosis not present

## 2017-01-29 DIAGNOSIS — Z96652 Presence of left artificial knee joint: Secondary | ICD-10-CM | POA: Diagnosis not present

## 2017-01-29 DIAGNOSIS — Z96653 Presence of artificial knee joint, bilateral: Secondary | ICD-10-CM | POA: Diagnosis not present

## 2017-01-29 DIAGNOSIS — Z471 Aftercare following joint replacement surgery: Secondary | ICD-10-CM | POA: Diagnosis not present

## 2017-01-29 DIAGNOSIS — M170 Bilateral primary osteoarthritis of knee: Secondary | ICD-10-CM | POA: Diagnosis not present

## 2017-06-01 DIAGNOSIS — M5416 Radiculopathy, lumbar region: Secondary | ICD-10-CM | POA: Diagnosis not present

## 2017-06-16 DIAGNOSIS — K219 Gastro-esophageal reflux disease without esophagitis: Secondary | ICD-10-CM | POA: Diagnosis not present

## 2017-06-16 DIAGNOSIS — H903 Sensorineural hearing loss, bilateral: Secondary | ICD-10-CM | POA: Diagnosis not present

## 2017-06-16 DIAGNOSIS — R0982 Postnasal drip: Secondary | ICD-10-CM | POA: Diagnosis not present

## 2017-06-16 DIAGNOSIS — J310 Chronic rhinitis: Secondary | ICD-10-CM | POA: Diagnosis not present

## 2017-10-06 DIAGNOSIS — G894 Chronic pain syndrome: Secondary | ICD-10-CM | POA: Diagnosis not present

## 2017-12-23 DIAGNOSIS — H2513 Age-related nuclear cataract, bilateral: Secondary | ICD-10-CM | POA: Diagnosis not present

## 2018-02-02 DIAGNOSIS — G894 Chronic pain syndrome: Secondary | ICD-10-CM | POA: Diagnosis not present

## 2018-02-02 DIAGNOSIS — M5136 Other intervertebral disc degeneration, lumbar region: Secondary | ICD-10-CM | POA: Diagnosis not present

## 2018-06-01 DIAGNOSIS — G894 Chronic pain syndrome: Secondary | ICD-10-CM | POA: Diagnosis not present

## 2018-06-01 DIAGNOSIS — M5416 Radiculopathy, lumbar region: Secondary | ICD-10-CM | POA: Diagnosis not present

## 2018-06-01 DIAGNOSIS — M542 Cervicalgia: Secondary | ICD-10-CM | POA: Diagnosis not present

## 2018-07-15 DIAGNOSIS — M25512 Pain in left shoulder: Secondary | ICD-10-CM | POA: Diagnosis not present

## 2018-09-28 DIAGNOSIS — G894 Chronic pain syndrome: Secondary | ICD-10-CM | POA: Diagnosis not present

## 2018-09-28 DIAGNOSIS — M5416 Radiculopathy, lumbar region: Secondary | ICD-10-CM | POA: Diagnosis not present

## 2019-02-08 DIAGNOSIS — G894 Chronic pain syndrome: Secondary | ICD-10-CM | POA: Diagnosis not present

## 2019-02-08 DIAGNOSIS — M25512 Pain in left shoulder: Secondary | ICD-10-CM | POA: Diagnosis not present

## 2019-02-08 DIAGNOSIS — Z5181 Encounter for therapeutic drug level monitoring: Secondary | ICD-10-CM | POA: Diagnosis not present

## 2019-02-08 DIAGNOSIS — M5416 Radiculopathy, lumbar region: Secondary | ICD-10-CM | POA: Diagnosis not present

## 2019-02-08 DIAGNOSIS — Z79899 Other long term (current) drug therapy: Secondary | ICD-10-CM | POA: Diagnosis not present

## 2019-03-24 DIAGNOSIS — M25511 Pain in right shoulder: Secondary | ICD-10-CM | POA: Diagnosis not present

## 2019-03-24 DIAGNOSIS — M79644 Pain in right finger(s): Secondary | ICD-10-CM | POA: Diagnosis not present

## 2019-05-31 DIAGNOSIS — M5416 Radiculopathy, lumbar region: Secondary | ICD-10-CM | POA: Diagnosis not present

## 2019-05-31 DIAGNOSIS — G894 Chronic pain syndrome: Secondary | ICD-10-CM | POA: Diagnosis not present

## 2019-10-04 DIAGNOSIS — G894 Chronic pain syndrome: Secondary | ICD-10-CM | POA: Diagnosis not present

## 2019-10-11 ENCOUNTER — Telehealth: Payer: Self-pay | Admitting: Family Medicine

## 2019-10-11 NOTE — Telephone Encounter (Signed)
Starling Manns NP from [employer], saw pt today and did a PAD screening and left leg had moderating reading.   Starling Manns Phone: [phone]

## 2019-10-11 NOTE — Telephone Encounter (Signed)
I called Nidhi and advised she contact the pt as she has not been seen here since 2015 or let the pts specialist know as Philis Nettle stated the pt has been seen by Dr Nelva Bush.

## 2019-10-28 ENCOUNTER — Ambulatory Visit: Payer: Medicare Other | Attending: Internal Medicine

## 2019-10-28 DIAGNOSIS — Z23 Encounter for immunization: Secondary | ICD-10-CM | POA: Insufficient documentation

## 2019-10-28 NOTE — Progress Notes (Signed)
   Covid-19 Vaccination Clinic  Name:  Tammy [name]    MRN: [id number] DOB: 08-Aug-1941  10/28/2019  [name] was observed post Covid-19 immunization for 15 minutes without incident. She was provided with Vaccine Information Sheet and instruction to access the V-Safe system.   [name] was instructed to call 911 with any severe reactions post vaccine: Marland Kitchen Difficulty breathing  . Swelling of face and throat  . A fast heartbeat  . A bad rash all over body  . Dizziness and weakness

## 2019-11-29 ENCOUNTER — Ambulatory Visit: Payer: Medicare Other | Attending: Internal Medicine

## 2019-11-29 DIAGNOSIS — Z23 Encounter for immunization: Secondary | ICD-10-CM

## 2019-11-29 NOTE — Progress Notes (Signed)
   Covid-19 Vaccination Clinic  Name:  Tammy [name]    MRN: [id number] DOB: 1940-12-26  11/29/2019  Ms. [name] was observed post Covid-19 immunization for 15 minutes without incident. She was provided with Vaccine Information Sheet and instruction to access the V-Safe system.   Ms. [name] was instructed to call 911 with any severe reactions post vaccine: Marland Kitchen Difficulty breathing  . Swelling of face and throat  . A fast heartbeat  . A bad rash all over body  . Dizziness and weakness   Immunizations Administered    Name Date Dose VIS Date Route   Pfizer COVID-19 Vaccine 11/29/2019  4:31 PM 0.3 mL 08/05/2019 Intramuscular   Manufacturer: [employer]   Lot: [id number]   [place]: [id number]

## 2020-01-19 ENCOUNTER — Other Ambulatory Visit: Payer: Self-pay

## 2020-01-20 ENCOUNTER — Telehealth: Payer: Self-pay | Admitting: Family Medicine

## 2020-01-20 ENCOUNTER — Encounter: Payer: Self-pay | Admitting: Internal Medicine

## 2020-01-20 ENCOUNTER — Ambulatory Visit (INDEPENDENT_AMBULATORY_CARE_PROVIDER_SITE_OTHER): Payer: Medicare Other | Admitting: Internal Medicine

## 2020-01-20 VITALS — BP 130/80 | HR 82 | Temp 97.3°F | Ht 61.0 in | Wt 194.0 lb

## 2020-01-20 DIAGNOSIS — R7303 Prediabetes: Secondary | ICD-10-CM

## 2020-01-20 DIAGNOSIS — E785 Hyperlipidemia, unspecified: Secondary | ICD-10-CM

## 2020-01-20 DIAGNOSIS — Z1231 Encounter for screening mammogram for malignant neoplasm of breast: Secondary | ICD-10-CM

## 2020-01-20 DIAGNOSIS — K219 Gastro-esophageal reflux disease without esophagitis: Secondary | ICD-10-CM

## 2020-01-20 DIAGNOSIS — M545 Low back pain, unspecified: Secondary | ICD-10-CM

## 2020-01-20 DIAGNOSIS — I10 Essential (primary) hypertension: Secondary | ICD-10-CM | POA: Diagnosis not present

## 2020-01-20 DIAGNOSIS — G8929 Other chronic pain: Secondary | ICD-10-CM

## 2020-01-20 LAB — COMPLETE METABOLIC PANEL WITH GFR
AG Ratio: 1.5 (calc) (ref 1.0–2.5)
ALT: 16 U/L (ref 6–29)
AST: 18 U/L (ref 10–35)
Albumin: 4.1 g/dL (ref 3.6–5.1)
Alkaline phosphatase (APISO): 75 U/L (ref 37–153)
BUN/Creatinine Ratio: 18 (calc) (ref 6–22)
BUN: 19 mg/dL (ref 7–25)
CO2: 27 mmol/L (ref 20–32)
Calcium: 9.4 mg/dL (ref 8.6–10.4)
Chloride: 104 mmol/L (ref 98–110)
Creat: 1.06 mg/dL — ABNORMAL HIGH (ref 0.60–0.93)
GFR, Est African American: 58 mL/min/{1.73_m2} — ABNORMAL LOW (ref >=60)
GFR, Est Non African American: 50 mL/min/{1.73_m2} — ABNORMAL LOW (ref >=60)
Globulin: 2.8 g/dL (calc) (ref 1.9–3.7)
Glucose, Bld: 100 mg/dL — ABNORMAL HIGH (ref 65–99)
Potassium: 4.6 mmol/L (ref 3.5–5.3)
Sodium: 139 mmol/L (ref 135–146)
Total Bilirubin: 0.5 mg/dL (ref 0.2–1.2)
Total Protein: 6.9 g/dL (ref 6.1–8.1)

## 2020-01-20 LAB — LIPID PANEL
Cholesterol: 219 mg/dL — ABNORMAL HIGH (ref 0–200)
HDL: 43.6 mg/dL (ref >39.00)
LDL Cholesterol: 137 mg/dL — ABNORMAL HIGH (ref 0–99)
NonHDL: 175.22
Total CHOL/HDL Ratio: 5
Triglycerides: 190 mg/dL — ABNORMAL HIGH (ref 0.0–149.0)
VLDL: 38 mg/dL (ref 0.0–40.0)

## 2020-01-20 LAB — CBC WITH DIFFERENTIAL/PLATELET
Basophils Absolute: 0 10*3/uL (ref 0.0–0.1)
Basophils Relative: 0.5 % (ref 0.0–3.0)
Eosinophils Absolute: 0.1 10*3/uL (ref 0.0–0.7)
Eosinophils Relative: 2.1 % (ref 0.0–5.0)
HCT: 35.4 % — ABNORMAL LOW (ref 36.0–46.0)
Hemoglobin: 11.3 g/dL — ABNORMAL LOW (ref 12.0–15.0)
Lymphocytes Relative: 25.4 % (ref 12.0–46.0)
Lymphs Abs: 1.2 10*3/uL (ref 0.7–4.0)
MCHC: 32 g/dL (ref 30.0–36.0)
MCV: 78.5 fl (ref 78.0–100.0)
Monocytes Absolute: 0.3 10*3/uL (ref 0.1–1.0)
Monocytes Relative: 6.3 % (ref 3.0–12.0)
Neutro Abs: 3.2 10*3/uL (ref 1.4–7.7)
Neutrophils Relative %: 65.7 % (ref 43.0–77.0)
Platelets: 184 10*3/uL (ref 150.0–400.0)
RBC: 4.52 Mil/uL (ref 3.87–5.11)
RDW: 18.2 % — ABNORMAL HIGH (ref 11.5–15.5)
WBC: 4.9 10*3/uL (ref 4.0–10.5)

## 2020-01-20 LAB — TSH: TSH: 2.55 u[IU]/mL (ref 0.35–4.50)

## 2020-01-20 NOTE — Telephone Encounter (Signed)
I left a detailed message at the pts cell number with the message below.

## 2020-01-20 NOTE — Patient Instructions (Signed)
-  Nice seeing you today!!  -Schedule follow up in 3 months for your physical if you would like to continue care with me.

## 2020-01-20 NOTE — Telephone Encounter (Signed)
Pt saw Dr. Jerilee Hoh today for a new pt appt. Pt is wanting a PCP that will take over her Oxycodone prescription from her Orthopedic. Dr. Jerilee Hoh is not willing to take that over so the pt would like to know if she transfer to Dr. Ethlyn Gallery is she willing to take over that prescription?   Pt can be reached at [phone]-best time to reach the pt is between 11am to 1pm

## 2020-01-20 NOTE — Telephone Encounter (Signed)
I am not comfortable taking over oxycodone prescribing.

## 2020-01-20 NOTE — Progress Notes (Signed)
Established Patient Office Visit     This visit occurred during the SARS-CoV-2 public health emergency.  Safety protocols were in place, including screening questions prior to the visit, additional usage of staff PPE, and extensive cleaning of exam room while observing appropriate contact time as indicated for disinfecting solutions.    CC/Reason for Visit: Transfer care, discuss chronic conditions  HPI: Tammy [name] is a 79 y.o. female who is coming in today for the above mentioned reasons.  She was previously seeing Dr. Maudie Mercury but is now in the need to transfer care.  Her past medical history is significant for: Hypertension, GERD, impaired glucose tolerance, morbid obesity and chronic back pain on a chronic oxycodone contract with Dr. Nelva Bush.  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  Her family history significant for mother who died at age 67, she was a brittle diabetic.  She is wondering whether I will take over her oxycodone prescription today.  She has no acute complaints.   Past Medical/Surgical History: Past Medical History:  Diagnosis Date  . Anxiety   . Arthritis    knees and back  . Bruised rib 01/18/2014   x-rayed  . Chicken pox   . DDD (degenerative disc disease), lumbar   . Depression    mild, never on medication  . Deviated septum   . GERD (gastroesophageal reflux disease)   . Heart murmur    told in 2012 benign  . Hemorrhoids   . High cholesterol    borderline, no meds  . Hyperglycemia    borderline  . Hypertension    high blood pressure readings, no meds  . Osteoarthritis   . Pre-diabetes    diet- controlled  . Rib pain on left side 01/20/2014    xray done seen by MD 01/20/14 in [facility]   . Scoliosis   . Varicose vein     Past Surgical History:  Procedure  Laterality Date  . ABDOMINAL HYSTERECTOMY  1972  . COLONOSCOPY  05/27/2013  . DENTAL SURGERY  2007   left jaw bone graft, 2 implants on left, 2 implants on right  . HEMORRHOID BANDING  06/03/13  . HEMORROIDECTOMY  1975  . INCISION AND DRAINAGE BREAST ABSCESS Bilateral 1966, 1967  . MASS EXCISION N/A 04/22/2013   Procedure: EXCISION MULTPILE soft tissue masses and abnormal skin lesion left thigh /left chest/ left posterior shoulder/ right flank ;  Surgeon: Harl Bowie, MD;  Location: [facility];  Service: General;  Laterality: N/A;  multiple sites:  left thigh, left shoulder, right flank, right upper arm  . OVARIAN CYST SURGERY Bilateral 1991  . OVARIAN CYST SURGERY    . SEPTOPLASTY Bilateral 12/16/2016   Procedure: SEPTOPLASTY;  Surgeon: Leta Baptist, MD;  Location: [facility];  Service: ENT;  Laterality: Bilateral;  . TONSILLECTOMY AND ADENOIDECTOMY  1973  . TOTAL KNEE ARTHROPLASTY Right 07/25/2013   Procedure: RIGHT TOTAL KNEE ARTHROPLASTY;  Surgeon: Gearlean Alf, MD;  Location: [facility];  Service: Orthopedics;  Laterality: Right;  Left knee cortisone injection  . TOTAL KNEE ARTHROPLASTY Left 01/30/2014   Procedure: LEFT TOTAL KNEE ARTHROPLASTY;  Surgeon: Gearlean Alf, MD;  Location: [facility];  Service: Orthopedics;  Laterality: Left;  . TUBAL LIGATION  1970    Social History:  reports that she  has never smoked. She has never used smokeless tobacco. She reports current alcohol use. She reports that she does not use drugs.  Allergies: Allergies  Allergen Reactions  . Hydrocodone     Made her feel very strange, wired, restless   . Penicillins Rash    Family History:  Family History  Problem Relation Age of Onset  . Alcohol abuse Father   . Heart disease Father   . Arthritis Mother   . Stroke Mother   . Hypertension Mother   . Diabetes Mother   . Breast cancer Maternal Grandmother   . Breast cancer Maternal Aunt   . Colon cancer Maternal Uncle   . Esophageal  cancer Neg Hx   . Stomach cancer Neg Hx   . Rectal cancer Neg Hx      Current Outpatient Medications:  .  amitriptyline (ELAVIL) 25 MG tablet, Take 25 mg by mouth at bedtime as needed. , Disp: , Rfl:  .  bisacodyl (DULCOLAX) 5 MG EC tablet, Take 5 mg by mouth daily as needed for moderate constipation., Disp: , Rfl:  .  cyclobenzaprine (FLEXERIL) 10 MG tablet, Take 10 mg by mouth 3 (three) times daily as needed for muscle spasms., Disp: , Rfl:  .  etodolac (LODINE) 400 MG tablet, Take 400 mg by mouth 2 (two) times daily., Disp: , Rfl: 1 .  gabapentin (NEURONTIN) 100 MG capsule, gabapentin 100 mg capsule  Take 1 capsule 3 times a day by oral route., Disp: , Rfl:  .  ipratropium (ATROVENT) 0.06 % nasal spray, ipratropium bromide 42 mcg (0.06 %) nasal spray, Disp: , Rfl:  .  Multiple Vitamins-Minerals (MULTI-VITAMIN GUMMIES PO), Take by mouth., Disp: , Rfl:  .  Omeprazole-Sodium Bicarbonate (ZEGERID) 20-1100 MG CAPS capsule, Take 1 capsule by mouth in the morning and at bedtime., Disp: , Rfl:  .  oxyCODONE (OXY IR/ROXICODONE) 5 MG immediate release tablet, Take 1-2 tablets (5-10 mg total) by mouth every 3 (three) hours as needed for breakthrough pain., Disp: 80 tablet, Rfl: 0 .  traMADol (ULTRAM) 50 MG tablet, Take 1-2 tablets (50-100 mg total) by mouth every 6 (six) hours as needed for moderate pain., Disp: 60 tablet, Rfl: 0  Review of Systems:  Constitutional: Denies fever, chills, diaphoresis, appetite change and fatigue.  HEENT: Denies photophobia, eye pain, redness, hearing loss, ear pain, congestion, sore throat, rhinorrhea, sneezing, mouth sores, trouble swallowing, neck pain, neck stiffness and tinnitus.   Respiratory: Denies SOB, DOE, cough, chest tightness,  and wheezing.   Cardiovascular: Denies chest pain, palpitations and leg swelling.  Gastrointestinal: Denies nausea, vomiting, abdominal pain, diarrhea, constipation, blood in stool and abdominal distention.  Genitourinary: Denies  dysuria, urgency, frequency, hematuria, flank pain and difficulty urinating.  Endocrine: Denies: hot or cold intolerance, sweats, changes in hair or nails, polyuria, polydipsia. Musculoskeletal: Denies myalgias, back pain, joint swelling, arthralgias and gait problem.  Skin: Denies pallor, rash and wound.  Neurological: Denies dizziness, seizures, syncope, weakness, light-headedness, numbness and headaches.  Hematological: Denies adenopathy. Easy bruising, personal or family bleeding history  Psychiatric/Behavioral: Denies suicidal ideation, mood changes, confusion, nervousness, sleep disturbance and agitation    Physical Exam: Vitals:   01/20/20 0901  BP: 130/80  Pulse: 82  Temp: (!) 97.3 F (36.3 C)  TempSrc: Temporal  SpO2: 97%  Weight: 194 lb (88 kg)  Height: 5\' 1"  (1.549 m)    Body mass index is 36.66 kg/m.   Constitutional: NAD, calm, comfortable Eyes: PERRL, lids and conjunctivae normal ENMT: Mucous  membranes are moist.  Respiratory: clear to auscultation bilaterally, no wheezing, no crackles. Normal respiratory effort. No accessory muscle use.  Cardiovascular: Regular rate and rhythm, no murmurs / rubs / gallops. No extremity edema.  Neurologic: Grossly intact and nonfocal Psychiatric: Normal judgment and insight. Alert and oriented x 3. Normal mood.    Impression and Plan:  Encounter for screening mammogram for malignant neoplasm of breast  - Plan: MM Digital Screening  Essential hypertension, benign -Well-controlled on current regimen.  Gastroesophageal reflux disease without esophagitis -Well-controlled on Nexium and Zantac.  Chronic low back pain, unspecified back pain laterality, unspecified whether sciatica present -She has been receiving oxycodone through Dr. Jeralyn Ruths office. -She is wanting to transfer her contract over to me in order for simplicity and less co-pays. -I have advised her that I have decided in general not to engage into any new  oxycodone contracts and that unfortunately I am not willing to take over her prescription at this time.  I have advised her that this in no way reflects on her and her use of oxycodone, I see no evidence of narcotic misuse.  Prediabetes -Needs to have A1c checked at next visit.  Hyperlipidemia, mild -No recent LDL on file.  Morbid obesity ([place]) -We discussed a little about weight management, I have advised her that before I would be willing to prescribe a medication for weight loss, that she would need to show that she is willing to put the time and effort into lifestyle modifications. -We will continue to address this at subsequent visits.    Patient Instructions  -Nice seeing you today!!  -Schedule follow up in 3 months for your physical if you would like to continue care with me.       Lelon Frohlich, MD [facility]

## 2020-01-24 ENCOUNTER — Encounter: Payer: Self-pay | Admitting: *Deleted

## 2020-01-24 ENCOUNTER — Encounter: Payer: Self-pay | Admitting: Internal Medicine

## 2020-01-24 DIAGNOSIS — N183 Chronic kidney disease, stage 3 unspecified: Secondary | ICD-10-CM | POA: Insufficient documentation

## 2020-01-31 DIAGNOSIS — Z79899 Other long term (current) drug therapy: Secondary | ICD-10-CM | POA: Diagnosis not present

## 2020-02-07 ENCOUNTER — Ambulatory Visit: Payer: Medicare Other

## 2020-02-09 ENCOUNTER — Ambulatory Visit
Admission: RE | Admit: 2020-02-09 | Discharge: 2020-02-09 | Disposition: A | Discharge status: Home / Self Care | Payer: Medicare Other | Source: Ambulatory Visit | Attending: Internal Medicine | Admitting: Internal Medicine

## 2020-02-09 ENCOUNTER — Other Ambulatory Visit: Payer: Self-pay

## 2020-02-09 DIAGNOSIS — Z1231 Encounter for screening mammogram for malignant neoplasm of breast: Secondary | ICD-10-CM

## 2020-03-21 DIAGNOSIS — Z20822 Contact with and (suspected) exposure to covid-19: Secondary | ICD-10-CM | POA: Diagnosis not present

## 2020-03-21 DIAGNOSIS — Z03818 Encounter for observation for suspected exposure to other biological agents ruled out: Secondary | ICD-10-CM | POA: Diagnosis not present

## 2020-06-13 DIAGNOSIS — Z79899 Other long term (current) drug therapy: Secondary | ICD-10-CM | POA: Diagnosis not present

## 2020-10-04 DIAGNOSIS — G894 Chronic pain syndrome: Secondary | ICD-10-CM | POA: Diagnosis not present

## 2021-02-18 DIAGNOSIS — G894 Chronic pain syndrome: Secondary | ICD-10-CM | POA: Diagnosis not present

## 2021-03-05 DIAGNOSIS — Z23 Encounter for immunization: Secondary | ICD-10-CM | POA: Diagnosis not present

## 2021-05-21 ENCOUNTER — Telehealth: Payer: Self-pay | Admitting: Internal Medicine

## 2021-05-21 DIAGNOSIS — G894 Chronic pain syndrome: Secondary | ICD-10-CM | POA: Diagnosis not present

## 2021-05-21 DIAGNOSIS — M5416 Radiculopathy, lumbar region: Secondary | ICD-10-CM | POA: Diagnosis not present

## 2021-05-21 NOTE — Telephone Encounter (Signed)
Advised patient that she would need an office visit. Appointment scheduled.

## 2021-05-21 NOTE — Telephone Encounter (Signed)
PT called to advise that she needs to do a urinalysis and drop it off at our lab since her insurance will cover it if its done here and pay. PT states she needs to do it for her Dr.Hernandez and to have a copy sent off to Dr.Ramos as well. Please advise as see no orders on file and she wants to do it today.

## 2021-05-22 ENCOUNTER — Other Ambulatory Visit: Payer: Self-pay

## 2021-05-22 ENCOUNTER — Encounter: Payer: Self-pay | Admitting: Internal Medicine

## 2021-05-22 ENCOUNTER — Ambulatory Visit (INDEPENDENT_AMBULATORY_CARE_PROVIDER_SITE_OTHER): Payer: Medicare Other | Admitting: Internal Medicine

## 2021-05-22 VITALS — BP 140/80 | HR 84 | Temp 98.0°F | Wt 180.2 lb

## 2021-05-22 DIAGNOSIS — Z23 Encounter for immunization: Secondary | ICD-10-CM

## 2021-05-22 DIAGNOSIS — G4700 Insomnia, unspecified: Secondary | ICD-10-CM

## 2021-05-22 DIAGNOSIS — N1831 Chronic kidney disease, stage 3a: Secondary | ICD-10-CM

## 2021-05-22 DIAGNOSIS — K219 Gastro-esophageal reflux disease without esophagitis: Secondary | ICD-10-CM

## 2021-05-22 LAB — POCT URINALYSIS DIPSTICK
Bilirubin, UA: NEGATIVE
Blood, UA: NEGATIVE
Glucose, UA: NEGATIVE
Ketones, UA: NEGATIVE
Leukocytes, UA: NEGATIVE
Nitrite, UA: NEGATIVE
Protein, UA: POSITIVE — AB
Spec Grav, UA: 1.025 (ref 1.010–1.025)
Urobilinogen, UA: 0.2 E.U./dL
pH, UA: 6 (ref 5.0–8.0)

## 2021-05-22 MED ORDER — TRAZODONE HCL 50 MG PO TABS: 25.0000 mg | ORAL_TABLET | Freq: Every evening | ORAL | 1 refills | Status: DC | PRN

## 2021-05-22 MED ORDER — PANTOPRAZOLE SODIUM 40 MG PO TBEC: 40.0000 mg | DELAYED_RELEASE_TABLET | Freq: Every day | ORAL | 1 refills | Status: DC

## 2021-05-22 NOTE — Patient Instructions (Signed)
-  Nice seeing you today!!  -Please schedule follow up for physical in 3 months. Come in fasting.  -Start trazodone 50 mg at bedtime as needed for sleeping.  -Stop omeprazole and start protonix 40 mg daily.

## 2021-05-22 NOTE — Progress Notes (Signed)
Established Patient Office Visit     This visit occurred during the SARS-CoV-2 public health emergency.  Safety protocols were in place, including screening questions prior to the visit, additional usage of staff PPE, and extensive cleaning of exam room while observing appropriate contact time as indicated for disinfecting solutions.    CC/Reason for Visit: Discuss some concerns  HPI: Tammy [name] is a 80 y.o. female who is coming in today for the above mentioned reasons. Past Medical History is significant for: Hypertension, GERD, impaired glucose tolerance, morbid obesity, chronic back pain, chronic kidney disease stage III, hyperlipidemia.  She is requesting a urine analysis although she has no symptoms of UTI.  She says it is more for her chronic kidney disease.  I have told her that we usually monitor her kidney function through blood work but she nonetheless insists on a urine test today.  She is also requesting that we prescribe her something stronger than omeprazole for her GERD as she is still having breakthrough symptoms.  She has also suffered from long-term insomnia and is requesting prescription medication for this.  She had used Ativan in the past with success.  She is requesting flu vaccination.   Past Medical/Surgical History: Past Medical History:  Diagnosis Date   Anxiety    Arthritis    knees and back   Bruised rib 01/18/2014   x-rayed   Chicken pox    DDD (degenerative disc disease), lumbar    Depression    mild, never on medication   Deviated septum    GERD (gastroesophageal reflux disease)    Heart murmur    told in 2012 benign   Hemorrhoids    High cholesterol    borderline, no meds   Hyperglycemia    borderline   Hypertension    high blood pressure readings, no meds   Osteoarthritis    Pre-diabetes    diet- controlled   Rib pain on left side 01/20/2014    xray done seen by MD 01/20/14 in [facility]    Scoliosis    Varicose vein     Past  Surgical History:  Procedure Laterality Date   ABDOMINAL HYSTERECTOMY  1972   COLONOSCOPY  05/27/2013   DENTAL SURGERY  2007   left jaw bone graft, 2 implants on left, 2 implants on right   HEMORRHOID BANDING  06/03/13   [place] BREAST ABSCESS Bilateral 1966, 1967   MASS EXCISION N/A 04/22/2013   Procedure: EXCISION MULTPILE soft tissue masses and abnormal skin lesion left thigh /left chest/ left posterior shoulder/ right flank ;  Surgeon: Harl Bowie, MD;  Location: [facility];  Service: General;  Laterality: N/A;  multiple sites:  left thigh, left shoulder, right flank, right upper arm   OVARIAN CYST SURGERY Bilateral 1991   OVARIAN CYST SURGERY     SEPTOPLASTY Bilateral 12/16/2016   Procedure: SEPTOPLASTY;  Surgeon: Leta Baptist, MD;  Location: [place];  Service: ENT;  Laterality: Bilateral;   [place] Right 07/25/2013   Procedure: RIGHT TOTAL KNEE ARTHROPLASTY;  Surgeon: Gearlean Alf, MD;  Location: [facility];  Service: Orthopedics;  Laterality: Right;  Left knee cortisone injection   TOTAL KNEE ARTHROPLASTY Left 01/30/2014   Procedure: LEFT TOTAL KNEE ARTHROPLASTY;  Surgeon: Gearlean Alf, MD;  Location: [facility];  Service: Orthopedics;  Laterality: Left;   [place]  Social History:  reports that she has never smoked. She has never used smokeless tobacco. She reports current alcohol use. She reports that she does not use drugs.  Allergies: Allergies  Allergen Reactions   Hydrocodone     Made her feel very strange, wired, restless    Penicillins Rash    Family History:  Family History  Problem Relation Age of Onset   Alcohol abuse Father    Heart disease Father    Arthritis Mother    Stroke Mother    Hypertension Mother    Diabetes Mother    Breast cancer Maternal Grandmother    Breast cancer Maternal Aunt    Colon cancer Maternal Uncle    Esophageal  cancer Neg Hx    Stomach cancer Neg Hx    Rectal cancer Neg Hx      Current Outpatient Medications:    amitriptyline (ELAVIL) 25 MG tablet, Take 25 mg by mouth at bedtime as needed. , Disp: , Rfl:    bisacodyl (DULCOLAX) 5 MG EC tablet, Take 5 mg by mouth daily as needed for moderate constipation., Disp: , Rfl:    cyclobenzaprine (FLEXERIL) 10 MG tablet, Take 10 mg by mouth 3 (three) times daily as needed for muscle spasms., Disp: , Rfl:    etodolac (LODINE) 400 MG tablet, Take 400 mg by mouth 2 (two) times daily., Disp: , Rfl: 1   gabapentin (NEURONTIN) 100 MG capsule, gabapentin 100 mg capsule  Take 1 capsule 3 times a day by oral route., Disp: , Rfl:    ipratropium (ATROVENT) 0.06 % nasal spray, ipratropium bromide 42 mcg (0.06 %) nasal spray, Disp: , Rfl:    Multiple Vitamins-Minerals (MULTI-VITAMIN GUMMIES PO), Take by mouth., Disp: , Rfl:    oxyCODONE (OXY IR/ROXICODONE) 5 MG immediate release tablet, Take 1-2 tablets (5-10 mg total) by mouth every 3 (three) hours as needed for breakthrough pain., Disp: 80 tablet, Rfl: 0   pantoprazole (PROTONIX) 40 MG tablet, Take 1 tablet (40 mg total) by mouth daily., Disp: 90 tablet, Rfl: 1   traMADol (ULTRAM) 50 MG tablet, Take 1-2 tablets (50-100 mg total) by mouth every 6 (six) hours as needed for moderate pain., Disp: 60 tablet, Rfl: 0   traZODone (DESYREL) 50 MG tablet, Take 0.5-1 tablets (25-50 mg total) by mouth at bedtime as needed for sleep., Disp: 30 tablet, Rfl: 1  Review of Systems:  Constitutional: Denies fever, chills, diaphoresis, appetite change and fatigue.  HEENT: Denies photophobia, eye pain, redness, hearing loss, ear pain, congestion, sore throat, rhinorrhea, sneezing, mouth sores, trouble swallowing, neck pain, neck stiffness and tinnitus.   Respiratory: Denies SOB, DOE, cough, chest tightness,  and wheezing.   Cardiovascular: Denies chest pain, palpitations and leg swelling.  Gastrointestinal: Denies nausea, vomiting,  abdominal pain, diarrhea, constipation, blood in stool and abdominal distention.  Genitourinary: Denies dysuria, urgency, frequency, hematuria, flank pain and difficulty urinating.  Endocrine: Denies: hot or cold intolerance, sweats, changes in hair or nails, polyuria, polydipsia. Musculoskeletal: Denies myalgias, back pain, joint swelling, arthralgias and gait problem.  Skin: Denies pallor, rash and wound.  Neurological: Denies dizziness, seizures, syncope, weakness, light-headedness, numbness and headaches.  Hematological: Denies adenopathy. Easy bruising, personal or family bleeding history  Psychiatric/Behavioral: Denies suicidal ideation, mood changes, confusion, nervousness and agitation    Physical Exam: Vitals:   05/22/21 1404  BP: 140/80  Pulse: 84  Temp: 98 F (36.7 C)  TempSrc: Oral  SpO2: 94%  Weight: 180 lb 3.2 oz (81.7 kg)  Body mass index is 34.05 kg/m.   Constitutional: NAD, calm, comfortable Eyes: PERRL, lids and conjunctivae normal ENMT: Mucous membranes are moist.  Neurologic: Grossly intact and nonfocal Psychiatric: Normal judgment and insight. Alert and oriented x 3. Normal mood.    Impression and Plan:  Gastroesophageal reflux disease without esophagitis  - Plan: pantoprazole (PROTONIX) 40 MG tablet  Insomnia, unspecified type  - Plan: traZODone (DESYREL) 50 MG tablet -I have decided to go against Ativan given that it is habit-forming as well as can increase falls in the elderly. -We will try low-dose trazodone, she has been advised to use this sporadically maybe once every 3 days only if needed.  Stage 3a chronic kidney disease ([place])  - Plan: POCT urinalysis dipstick that is negative. -She will schedule follow-up in 3 months for physical with labs, I will recheck renal function then.  Need for influenza vaccination  - Plan: Flu Vaccine QUAD High Dose(Fluad)  Time spent: 32 minutes reviewing chart, interviewing and examining patient and  formulating plan of care.   Patient Instructions  -Nice seeing you today!!  -Please schedule follow up for physical in 3 months. Come in fasting.  -Start trazodone 50 mg at bedtime as needed for sleeping.  -Stop omeprazole and start protonix 40 mg daily.   Lelon Frohlich, MD [facility]

## 2021-07-02 ENCOUNTER — Telehealth: Payer: Self-pay | Admitting: Internal Medicine

## 2021-07-02 NOTE — Telephone Encounter (Signed)
Pt is calling and never received new rx ipratropium (ATROVENT) 0.06 % nasal spray  [id number] [address], [place] Phone:  [phone]  Fax:  [phone]

## 2021-07-03 MED ORDER — IPRATROPIUM BROMIDE 0.06 % NA SOLN: NASAL | 2 refills | Status: DC

## 2021-07-03 NOTE — Telephone Encounter (Signed)
Refill sent.

## 2021-07-14 ENCOUNTER — Other Ambulatory Visit: Payer: Self-pay | Admitting: Internal Medicine

## 2021-07-14 DIAGNOSIS — G4700 Insomnia, unspecified: Secondary | ICD-10-CM

## 2021-08-27 ENCOUNTER — Encounter (HOSPITAL_BASED_OUTPATIENT_CLINIC_OR_DEPARTMENT_OTHER): Payer: Self-pay

## 2021-08-27 ENCOUNTER — Emergency Department (HOSPITAL_BASED_OUTPATIENT_CLINIC_OR_DEPARTMENT_OTHER)
Admission: EM | Admit: 2021-08-27 | Discharge: 2021-08-27 | Disposition: A | Discharge status: Home / Self Care | Payer: Medicare Other | Attending: Emergency Medicine | Admitting: Emergency Medicine

## 2021-08-27 ENCOUNTER — Other Ambulatory Visit: Payer: Self-pay

## 2021-08-27 ENCOUNTER — Telehealth: Payer: Self-pay | Admitting: Internal Medicine

## 2021-08-27 DIAGNOSIS — R6889 Other general symptoms and signs: Secondary | ICD-10-CM | POA: Diagnosis not present

## 2021-08-27 DIAGNOSIS — R040 Epistaxis: Secondary | ICD-10-CM | POA: Diagnosis not present

## 2021-08-27 DIAGNOSIS — Z743 Need for continuous supervision: Secondary | ICD-10-CM | POA: Diagnosis not present

## 2021-08-27 DIAGNOSIS — R404 Transient alteration of awareness: Secondary | ICD-10-CM | POA: Diagnosis not present

## 2021-08-27 DIAGNOSIS — R55 Syncope and collapse: Secondary | ICD-10-CM | POA: Diagnosis not present

## 2021-08-27 DIAGNOSIS — R4020 Unspecified coma: Secondary | ICD-10-CM | POA: Diagnosis not present

## 2021-08-27 DIAGNOSIS — R58 Hemorrhage, not elsewhere classified: Secondary | ICD-10-CM | POA: Diagnosis not present

## 2021-08-27 LAB — CBC WITH DIFFERENTIAL/PLATELET
Abs Immature Granulocytes: 0.02 10*3/uL (ref 0.00–0.07)
Basophils Absolute: 0 10*3/uL (ref 0.0–0.1)
Basophils Relative: 1 %
Eosinophils Absolute: 0.1 10*3/uL (ref 0.0–0.5)
Eosinophils Relative: 2 %
HCT: 32.3 % — ABNORMAL LOW (ref 36.0–46.0)
Hemoglobin: 9.7 g/dL — ABNORMAL LOW (ref 12.0–15.0)
Immature Granulocytes: 0 %
Lymphocytes Relative: 23 %
Lymphs Abs: 1.3 10*3/uL (ref 0.7–4.0)
MCH: 23.7 pg — ABNORMAL LOW (ref 26.0–34.0)
MCHC: 30 g/dL (ref 30.0–36.0)
MCV: 78.8 fL — ABNORMAL LOW (ref 80.0–100.0)
Monocytes Absolute: 0.3 10*3/uL (ref 0.1–1.0)
Monocytes Relative: 5 %
Neutro Abs: 4.2 10*3/uL (ref 1.7–7.7)
Neutrophils Relative %: 69 %
Platelets: 209 10*3/uL (ref 150–400)
RBC: 4.1 MIL/uL (ref 3.87–5.11)
RDW: 16.5 % — ABNORMAL HIGH (ref 11.5–15.5)
WBC: 5.9 10*3/uL (ref 4.0–10.5)
nRBC: 0 % (ref 0.0–0.2)

## 2021-08-27 LAB — CBG MONITORING, ED: Glucose-Capillary: 144 mg/dL — ABNORMAL HIGH (ref 70–99)

## 2021-08-27 LAB — BASIC METABOLIC PANEL
Anion gap: 8 (ref 5–15)
BUN: 15 mg/dL (ref 8–23)
CO2: 25 mmol/L (ref 22–32)
Calcium: 9 mg/dL (ref 8.9–10.3)
Chloride: 104 mmol/L (ref 98–111)
Creatinine, Ser: 1.11 mg/dL — ABNORMAL HIGH (ref 0.44–1.00)
GFR, Estimated: 50 mL/min — ABNORMAL LOW (ref >60)
Glucose, Bld: 142 mg/dL — ABNORMAL HIGH (ref 70–99)
Potassium: 4 mmol/L (ref 3.5–5.1)
Sodium: 137 mmol/L (ref 135–145)

## 2021-08-27 NOTE — ED Provider Notes (Signed)
[name] Provider Note   CSN: [id number] Arrival date & time: 08/27/21  2016     History  Chief Complaint  Patient presents with   Loss of Consciousness    Pt went to Urgent Care today for nose bleed, while there, pt had episode of vomiting then syncopal episode for approx 3 minutes at Urgent Care. Nose bleed has resolved at this time.     Tammy [name] is a 81 y.o. female.  81 yo F with a chief complaint of a syncopal event.  The patient had been suffering from right-sided epistaxis went to urgent care and had event where she vomited and then lost consciousness for a couple minutes.  She feels much better now.  Tells me that the bleeding has stopped.  Does not feel any bleeding down the posterior oropharynx.  Denies headaches denies neck pain denies chest pain or shortness of breath.  She feels exhausted from her day today and would like to go home and go to sleep.  The history is provided by the patient.  Loss of Consciousness Associated symptoms: no chest pain, no dizziness, no fever, no headaches, no nausea, no palpitations, no shortness of breath and no vomiting   Illness Severity:  Moderate Onset quality:  Gradual Duration:  6 hours Timing:  Constant Progression:  Worsening Chronicity:  New Associated symptoms: no chest pain, no congestion, no fever, no headaches, no myalgias, no nausea, no rhinorrhea, no shortness of breath, no vomiting and no wheezing       Home Medications Prior to Admission medications   Medication Sig Start Date End Date Taking? Authorizing Provider  amitriptyline (ELAVIL) 25 MG tablet Take 25 mg by mouth at bedtime as needed.     [provider]  bisacodyl (DULCOLAX) 5 MG EC tablet Take 5 mg by mouth daily as needed for moderate constipation.    [provider]  cyclobenzaprine (FLEXERIL) 10 MG tablet Take 10 mg by mouth 3 (three) times daily as needed for muscle spasms.    [provider]  etodolac (LODINE) 400 MG tablet Take 400 mg by mouth 2 (two) times daily. 07/17/14   [provider]  gabapentin (NEURONTIN) 100 MG capsule gabapentin 100 mg capsule  Take 1 capsule 3 times a day by oral route.    [provider]  ipratropium (ATROVENT) 0.06 % nasal spray ipratropium bromide 42 mcg (0.06 %) nasal spray 07/03/21   Isaac Bliss, Rayford Halsted, MD  Multiple Vitamins-Minerals (MULTI-VITAMIN GUMMIES PO) Take by mouth.    [provider]  oxyCODONE (OXY IR/ROXICODONE) 5 MG immediate release tablet Take 1-2 tablets (5-10 mg total) by mouth every 3 (three) hours as needed for breakthrough pain. 01/31/14   Porterfield, Amber, PA-C  pantoprazole (PROTONIX) 40 MG tablet Take 1 tablet (40 mg total) by mouth daily. 05/22/21   Isaac Bliss, Rayford Halsted, MD  traMADol (ULTRAM) 50 MG tablet Take 1-2 tablets (50-100 mg total) by mouth every 6 (six) hours as needed for moderate pain. 01/31/14   Porterfield, Museum/gallery conservator, PA-C  traZODone (DESYREL) 50 MG tablet TAKE 0.5-1 TABLETS BY MOUTH AT BEDTIME AS NEEDED FOR SLEEP. 07/16/21   Isaac Bliss, Rayford Halsted, MD      Allergies    Hydrocodone and Penicillins    Review of Systems   Review of Systems  Constitutional:  Negative for chills and fever.  HENT:  Positive for nosebleeds. Negative for congestion and rhinorrhea.   Eyes:  Negative for redness and  visual disturbance.  Respiratory:  Negative for shortness of breath and wheezing.   Cardiovascular:  Positive for syncope. Negative for chest pain and palpitations.  Gastrointestinal:  Negative for nausea and vomiting.  Genitourinary:  Negative for dysuria and urgency.  Musculoskeletal:  Negative for arthralgias and myalgias.  Skin:  Negative for pallor and wound.  Neurological:  Negative for dizziness and headaches.   Physical Exam Updated Vital Signs BP 117/76 (BP Location: Right Arm)    Pulse 88    Temp 98.4 F (36.9 C) (Oral)    Resp 18    Ht 5' (1.524 m)    Wt  82.1 kg    SpO2 100%    BMI 35.35 kg/m  Physical Exam Vitals and nursing note reviewed.  Constitutional:      General: She is not in acute distress.    Appearance: She is well-developed. She is not diaphoretic.  HENT:     Head: Normocephalic and atraumatic.     Nose:     Comments: Blood at the posterior aspect of the right naris.  There is some blood at the posterior pharynx.  No obvious active signs of bleeding. Eyes:     Pupils: Pupils are equal, round, and reactive to light.  Cardiovascular:     Rate and Rhythm: Normal rate and regular rhythm.     Heart sounds: No murmur heard.   No friction rub. No gallop.  Pulmonary:     Effort: Pulmonary effort is normal.     Breath sounds: No wheezing or rales.  Abdominal:     General: There is no distension.     Palpations: Abdomen is soft.     Tenderness: There is no abdominal tenderness.  Musculoskeletal:        General: No tenderness.     Cervical back: Normal range of motion and neck supple.  Skin:    General: Skin is warm and dry.  Neurological:     Mental Status: She is alert and oriented to person, place, and time.  Psychiatric:        Behavior: Behavior normal.    ED Results / Procedures / Treatments   Labs (all labs ordered are listed, but only abnormal results are displayed) Labs Reviewed  CBC WITH DIFFERENTIAL/PLATELET - Abnormal; Notable for the following components:      Result Value   Hemoglobin 9.7 (*)    HCT 32.3 (*)    MCV 78.8 (*)    MCH 23.7 (*)    RDW 16.5 (*)    All other components within normal limits  BASIC METABOLIC PANEL - Abnormal; Notable for the following components:   Glucose, Bld 142 (*)    Creatinine, Ser 1.11 (*)    GFR, Estimated 50 (*)    All other components within normal limits  CBG MONITORING, ED - Abnormal; Notable for the following components:   Glucose-Capillary 144 (*)    All other components within normal limits    EKG EKG Interpretation  Date/Time:  Tuesday August 27 2021  20:35:36 EST Ventricular Rate:  90 PR Interval:  148 QRS Duration: 90 QT Interval:  365 QTC Calculation: 447 R Axis:   17 Text Interpretation: Sinus rhythm Abnormal R-wave progression, early transition no wpw, prolonged qt or brugada No significant change since last tracing Confirmed by Deno Etienne [phone]) on 08/27/2021 9:21:18 PM  Radiology No results found.  Procedures .1-3 Lead EKG Interpretation Performed by: Deno Etienne, DO Authorized by: Deno Etienne, DO  Interpretation: normal     ECG rate:  85   ECG rate assessment: normal     Rhythm: sinus rhythm     Ectopy: none     Conduction: normal      Medications Ordered in ED Medications - No data to display  ED Course/ Medical Decision Making/ A&P                           Medical Decision Making  81 yo F with a chief complaints of epistaxis.  This is been going on for most of the day today.  She went to urgent care and had an episode where she vomited blood and then she syncopized and when she woke up they had put her on an ambulance to take her here.  She feels much better now.  Would like to go home and go to sleep.  Tells me the bleeding is stopped.  She has signs of blood in the posterior pharynx but I see no active signs of bleeding and she does not endorse any sensation of active bleeding.  Her hemoglobin is 9.7.  This is about 2 g down from 1 was checked a year ago but no more recent study.  EKG without ischemic changes no concerning findings for Wolff-Parkinson-White prolonged QT or Brugada.   I reviewed the current documentation from the urgent care visit.  Endorses fluctuating epistaxis.  Felt need for transfer to the ER after syncopal event.  Patient reassessed and feeling much better.  She would like to go home at this time.  She again endorses that she is had no continued bleeding.  Given ENT follow-up if needed.  11:05 PM:  I have discussed the diagnosis/risks/treatment options with the patient and family and  believe the pt to be eligible for discharge home to follow-up with PCP. We also discussed returning to the ED immediately if new or worsening sx occur. We discussed the sx which are most concerning (e.g., sudden worsening pain, fever, inability to tolerate by mouth) that necessitate immediate return. Medications administered to the patient during their visit and any new prescriptions provided to the patient are listed below.  Medications given during this visit Medications - No data to display   The patient appears reasonably screen and/or stabilized for discharge and I doubt any other medical condition or other [facility] requiring further screening, evaluation, or treatment in the ED at this time prior to discharge.          Final Clinical Impression(s) / ED Diagnoses Final diagnoses:  Epistaxis  Syncope and collapse    Rx / DC Orders ED Discharge Orders     None         Deno Etienne, DO 08/27/21 2305

## 2021-08-27 NOTE — Discharge Instructions (Signed)
Try to get a humidifier for your bedroom at night.  Place a small amount of antibiotic on the tip of your pinky finger and apply just to the inside of the nostril a couple times a day.  I have given you information to follow-up with the ear nose and throat doctor in the office should the bleeding continue to be off and on.  If this reoccurs at home please hold direct pressure to the area for at least 15 minutes without peaking.  Try this 1 more time if it continues and if it continues after that then start making her way towards the emergency department.

## 2021-08-27 NOTE — Telephone Encounter (Signed)
Disregard

## 2021-08-27 NOTE — ED Notes (Signed)
EMT-P provided AVS using Teachback Method. Patient verbalizes understanding of Discharge Instructions. Opportunity for Questioning and Answers were provided by EMT-P. Patient Discharged from ED.  ? ?

## 2021-08-27 NOTE — ED Notes (Signed)
FLOAT NURSE 

## 2021-08-27 NOTE — ED Triage Notes (Signed)
Pt went to Urgent Care today for nose bleed, while there, pt had episode of vomiting then syncopal episode for approx 3 minutes at Urgent Care. Nose bleed has resolved at this time.

## 2021-08-30 ENCOUNTER — Other Ambulatory Visit: Payer: Self-pay | Admitting: Internal Medicine

## 2021-08-30 DIAGNOSIS — G4700 Insomnia, unspecified: Secondary | ICD-10-CM

## 2021-08-30 DIAGNOSIS — K219 Gastro-esophageal reflux disease without esophagitis: Secondary | ICD-10-CM

## 2021-09-03 DIAGNOSIS — G894 Chronic pain syndrome: Secondary | ICD-10-CM | POA: Diagnosis not present

## 2021-09-03 DIAGNOSIS — Z79899 Other long term (current) drug therapy: Secondary | ICD-10-CM | POA: Diagnosis not present

## 2021-09-06 DIAGNOSIS — H43812 Vitreous degeneration, left eye: Secondary | ICD-10-CM | POA: Diagnosis not present

## 2021-09-10 ENCOUNTER — Ambulatory Visit (INDEPENDENT_AMBULATORY_CARE_PROVIDER_SITE_OTHER): Payer: Medicare Other | Admitting: Internal Medicine

## 2021-09-10 VITALS — BP 128/80 | HR 90 | Temp 98.8°F | Resp 16 | Ht 61.0 in | Wt 179.0 lb

## 2021-09-10 DIAGNOSIS — R040 Epistaxis: Secondary | ICD-10-CM | POA: Diagnosis not present

## 2021-09-10 DIAGNOSIS — R413 Other amnesia: Secondary | ICD-10-CM

## 2021-09-10 DIAGNOSIS — R4789 Other speech disturbances: Secondary | ICD-10-CM

## 2021-09-10 LAB — CBC WITH DIFFERENTIAL/PLATELET
Basophils Absolute: 0 10*3/uL (ref 0.0–0.1)
Basophils Relative: 0.8 % (ref 0.0–3.0)
Eosinophils Absolute: 0.1 10*3/uL (ref 0.0–0.7)
Eosinophils Relative: 1.7 % (ref 0.0–5.0)
HCT: 28.1 % — ABNORMAL LOW (ref 36.0–46.0)
Hemoglobin: 8.9 g/dL — ABNORMAL LOW (ref 12.0–15.0)
Lymphocytes Relative: 22.2 % (ref 12.0–46.0)
Lymphs Abs: 1.1 10*3/uL (ref 0.7–4.0)
MCHC: 31.6 g/dL (ref 30.0–36.0)
MCV: 75.1 fl — ABNORMAL LOW (ref 78.0–100.0)
Monocytes Absolute: 0.2 10*3/uL (ref 0.1–1.0)
Monocytes Relative: 4.6 % (ref 3.0–12.0)
Neutro Abs: 3.6 10*3/uL (ref 1.4–7.7)
Neutrophils Relative %: 70.7 % (ref 43.0–77.0)
Platelets: 215 10*3/uL (ref 150.0–400.0)
RBC: 3.74 Mil/uL — ABNORMAL LOW (ref 3.87–5.11)
RDW: 17.4 % — ABNORMAL HIGH (ref 11.5–15.5)
WBC: 5.2 10*3/uL (ref 4.0–10.5)

## 2021-09-10 LAB — COMPREHENSIVE METABOLIC PANEL
ALT: 9 U/L (ref 0–35)
AST: 14 U/L (ref 0–37)
Albumin: 4.1 g/dL (ref 3.5–5.2)
Alkaline Phosphatase: 64 U/L (ref 39–117)
BUN: 17 mg/dL (ref 6–23)
CO2: 25 mEq/L (ref 19–32)
Calcium: 9.3 mg/dL (ref 8.4–10.5)
Chloride: 104 mEq/L (ref 96–112)
Creatinine, Ser: 1.06 mg/dL (ref 0.40–1.20)
GFR: 49.28 mL/min — ABNORMAL LOW (ref >60.00)
Glucose, Bld: 111 mg/dL — ABNORMAL HIGH (ref 70–99)
Potassium: 4.3 mEq/L (ref 3.5–5.1)
Sodium: 137 mEq/L (ref 135–145)
Total Bilirubin: 0.4 mg/dL (ref 0.2–1.2)
Total Protein: 7.4 g/dL (ref 6.0–8.3)

## 2021-09-10 LAB — TSH: TSH: 2.95 u[IU]/mL (ref 0.35–5.50)

## 2021-09-10 LAB — VITAMIN B12: Vitamin B-12: 209 pg/mL — ABNORMAL LOW (ref 211–911)

## 2021-09-10 NOTE — Progress Notes (Signed)
Established Patient Office Visit     This visit occurred during the SARS-CoV-2 public health emergency.  Safety protocols were in place, including screening questions prior to the visit, additional usage of staff PPE, and extensive cleaning of exam room while observing appropriate contact time as indicated for disinfecting solutions.    CC/Reason for Visit: Annual physical  HPI: Tammy [name] is a 81 y.o. female who is coming in today for the above mentioned reasons.  This visit was initially scheduled as her annual physical exam and wellness visit, however due to multiple acute and subacute complaints we have decided to defer and concentrate on those instead.  She states that 2 months ago she fell backwards and hit the back of her head causing a large hematoma that took weeks to resolve.  Ever since then and possibly even before that she has been noticing memory loss, things dropping from her hands and word-finding difficulty.  She describes an event where she was at the grocery store with her daughter and they were at the produce section and in her mind she saw an image of an apple and a banana that she wanted to purchase but she was unable to come up with the words.  Her daughter who is here today confirms all of these events.  On January 3 she had a severe episode of epistaxis that prompted an urgent care evaluation.  While there she was noted to have a blood pressure of 90/60 and had a syncopal event prompting transfer to emergency department.  Her work-up there was negative and she was discharged home with an appointment to follow-up with ENT and that is scheduled for next week.    Past Medical/Surgical History: Past Medical History:  Diagnosis Date   Anxiety    Arthritis    knees and back   Bruised rib 01/18/2014   x-rayed   Chicken pox    DDD (degenerative disc disease), lumbar    Depression    mild, never on medication   Deviated septum    GERD (gastroesophageal reflux  disease)    Heart murmur    told in 2012 benign   Hemorrhoids    High cholesterol    borderline, no meds   Hyperglycemia    borderline   Hypertension    high blood pressure readings, no meds   Osteoarthritis    Pre-diabetes    diet- controlled   Rib pain on left side 01/20/2014    xray done seen by MD 01/20/14 in [facility]    Scoliosis    Varicose vein     Past Surgical History:  Procedure Laterality Date   ABDOMINAL HYSTERECTOMY  1972   COLONOSCOPY  05/27/2013   DENTAL SURGERY  2007   left jaw bone graft, 2 implants on left, 2 implants on right   HEMORRHOID BANDING  06/03/13   [place] BREAST ABSCESS Bilateral 1966, 1967   MASS EXCISION N/A 04/22/2013   Procedure: EXCISION MULTPILE soft tissue masses and abnormal skin lesion left thigh /left chest/ left posterior shoulder/ right flank ;  Surgeon: Harl Bowie, MD;  Location: [facility];  Service: General;  Laterality: N/A;  multiple sites:  left thigh, left shoulder, right flank, right upper arm   OVARIAN CYST SURGERY Bilateral 1991   OVARIAN CYST SURGERY     SEPTOPLASTY Bilateral 12/16/2016   Procedure: SEPTOPLASTY;  Surgeon: Leta Baptist, MD;  Location: [place];  Service:  ENT;  Laterality: Bilateral;   TONSILLECTOMY AND ADENOIDECTOMY  1973   TOTAL KNEE ARTHROPLASTY Right 07/25/2013   Procedure: RIGHT TOTAL KNEE ARTHROPLASTY;  Surgeon: Gearlean Alf, MD;  Location: [facility];  Service: Orthopedics;  Laterality: Right;  Left knee cortisone injection   TOTAL KNEE ARTHROPLASTY Left 01/30/2014   Procedure: LEFT TOTAL KNEE ARTHROPLASTY;  Surgeon: Gearlean Alf, MD;  Location: [facility];  Service: Orthopedics;  Laterality: Left;   TUBAL LIGATION  1970    Social History:  reports that she has never smoked. She has never used smokeless tobacco. She reports current alcohol use. She reports that she does not use drugs.  Allergies: Allergies  Allergen Reactions   Hydrocodone     Made her  feel very strange, wired, restless  Made her feel very strange, wired, restless   Penicillins Rash    Family History:  Family History  Problem Relation Age of Onset   Alcohol abuse Father    Heart disease Father    Arthritis Mother    Stroke Mother    Hypertension Mother    Diabetes Mother    Breast cancer Maternal Grandmother    Breast cancer Maternal Aunt    Colon cancer Maternal Uncle    Esophageal cancer Neg Hx    Stomach cancer Neg Hx    Rectal cancer Neg Hx      Current Outpatient Medications:    amitriptyline (ELAVIL) 25 MG tablet, Take 25 mg by mouth at bedtime as needed. , Disp: , Rfl:    bisacodyl (DULCOLAX) 5 MG EC tablet, Take 5 mg by mouth daily as needed for moderate constipation., Disp: , Rfl:    cyclobenzaprine (FLEXERIL) 10 MG tablet, Take 10 mg by mouth 3 (three) times daily as needed for muscle spasms., Disp: , Rfl:    etodolac (LODINE) 400 MG tablet, Take 400 mg by mouth 2 (two) times daily., Disp: , Rfl: 1   gabapentin (NEURONTIN) 100 MG capsule, gabapentin 100 mg capsule  Take 1 capsule 3 times a day by oral route., Disp: , Rfl:    ipratropium (ATROVENT) 0.06 % nasal spray, ipratropium bromide 42 mcg (0.06 %) nasal spray, Disp: 15 mL, Rfl: 2   Multiple Vitamins-Minerals (MULTI-VITAMIN GUMMIES PO), Take by mouth., Disp: , Rfl:    oxyCODONE (OXY IR/ROXICODONE) 5 MG immediate release tablet, Take 1-2 tablets (5-10 mg total) by mouth every 3 (three) hours as needed for breakthrough pain., Disp: 80 tablet, Rfl: 0   pantoprazole (PROTONIX) 40 MG tablet, TAKE 1 TABLET BY MOUTH EVERY DAY, Disp: 90 tablet, Rfl: 1   traMADol (ULTRAM) 50 MG tablet, Take 1-2 tablets (50-100 mg total) by mouth every 6 (six) hours as needed for moderate pain., Disp: 60 tablet, Rfl: 0   traZODone (DESYREL) 50 MG tablet, TAKE 1/2 TO 1 TABLET BY MOUTH AT BEDTIME AS NEEDED FOR SLEEP, Disp: 30 tablet, Rfl: 1  Review of Systems:  Constitutional: Denies fever, chills, diaphoresis, appetite  change and fatigue.  HEENT: Denies photophobia, eye pain, redness, hearing loss, ear pain, congestion, sore throat, rhinorrhea, sneezing, mouth sores, trouble swallowing, neck pain, neck stiffness and tinnitus.   Respiratory: Denies SOB, DOE, cough, chest tightness,  and wheezing.   Cardiovascular: Denies chest pain, palpitations and leg swelling.  Gastrointestinal: Denies nausea, vomiting, abdominal pain, diarrhea, constipation, blood in stool and abdominal distention.  Genitourinary: Denies dysuria, urgency, frequency, hematuria, flank pain and difficulty urinating.  Endocrine: Denies: hot or cold intolerance, sweats, changes in hair or nails,  polyuria, polydipsia. Musculoskeletal: Denies myalgias, back pain, joint swelling, arthralgias and gait problem.  Skin: Denies pallor, rash and wound.  Neurological: Denies dizziness, seizures, syncope, weakness, light-headedness, numbness and headaches.  Hematological: Denies adenopathy. Easy bruising, personal or family bleeding history  Psychiatric/Behavioral: Denies suicidal ideation, mood changes,  nervousness, sleep disturbance and agitation    Physical Exam: Vitals:   09/10/21 1310  BP: 128/80  Pulse: 90  Resp: 16  Temp: 98.8 F (37.1 C)  SpO2: 98%  Weight: 179 lb (81.2 kg)  Height: 5\' 1"  (1.549 m)    Body mass index is 33.82 kg/m.   Constitutional: NAD, calm, comfortable Eyes: PERRL, lids and conjunctivae normal ENMT: Mucous membranes are moist.  Respiratory: clear to auscultation bilaterally, no wheezing, no crackles. Normal respiratory effort. No accessory muscle use.  Cardiovascular: Regular rate and rhythm, no murmurs / rubs / gallops. No extremity edema.  Psychiatric: Normal judgment and insight. Alert and oriented x 3. Normal mood.    Impression and Plan:  Memory loss - Plan: CBC with Differential/Platelet, Comprehensive metabolic panel, Vitamin F12, TSH, CT HEAD WO CONTRAST (5MM), Ambulatory referral to Neurology,  Ambulatory referral to Neuropsychology, TSH, Vitamin B12, Comprehensive metabolic panel, CBC with Differential/Platelet  Word finding difficulty  Epistaxis  -For epistaxis she will be seeing ENT soon, have advised moist air humidifier as well as avoidance of all nasal sprays until seen by them. -I am concerned that her memory loss and word finding difficulty seems to have gotten worse since her head injury for which she did not seek medical attention.  I will order CT scan to rule the possibility of brain bleed. -There is also some concern for dementia and I will send her to both neurology and neuropsychology for a cognitive evaluation and work-up.  Time spent: 34 minutes reviewing chart, interviewing and examining patient and formulating plan of care.      Lelon Frohlich, MD [facility]

## 2021-09-11 ENCOUNTER — Other Ambulatory Visit: Payer: Self-pay | Admitting: Internal Medicine

## 2021-09-11 ENCOUNTER — Inpatient Hospital Stay: Admission: RE | Admit: 2021-09-11 | Payer: Medicare Other | Source: Ambulatory Visit

## 2021-09-11 ENCOUNTER — Encounter: Payer: Self-pay | Admitting: Internal Medicine

## 2021-09-11 DIAGNOSIS — E538 Deficiency of other specified B group vitamins: Secondary | ICD-10-CM | POA: Insufficient documentation

## 2021-09-11 DIAGNOSIS — D509 Iron deficiency anemia, unspecified: Secondary | ICD-10-CM | POA: Insufficient documentation

## 2021-09-11 MED ORDER — IRON (FERROUS SULFATE) 325 (65 FE) MG PO TABS
325.0000 mg | ORAL_TABLET | Freq: Two times a day (BID) | ORAL | Status: DC
Start: 2021-09-11 — End: 2023-05-14

## 2021-09-11 NOTE — Progress Notes (Signed)
1. Vit B12 deficiency: IM weekly x 4 then monthly. In office or send supplies? Her B12 def can be contributing to her memory issues.  2. Kidney function is stable.  3. She is significantly anemic, likely worsened by her severe nosebleed. Appears to be iron deficient. Will add iron studies.Please ask lab if it can be added to previously drawn sample, if not will need to return for repeat draw. Please have her start ferrous sulfate 325 mg BID (OTC).  Rest of labs look ok.

## 2021-09-12 ENCOUNTER — Encounter: Payer: Self-pay | Admitting: Psychology

## 2021-09-12 ENCOUNTER — Encounter: Payer: Self-pay | Admitting: Physician Assistant

## 2021-09-16 ENCOUNTER — Telehealth: Payer: Self-pay | Admitting: Internal Medicine

## 2021-09-16 ENCOUNTER — Other Ambulatory Visit: Payer: Self-pay

## 2021-09-16 ENCOUNTER — Ambulatory Visit (INDEPENDENT_AMBULATORY_CARE_PROVIDER_SITE_OTHER)
Admission: RE | Admit: 2021-09-16 | Discharge: 2021-09-16 | Disposition: A | Discharge status: Home / Self Care | Payer: Medicare Other | Source: Ambulatory Visit | Attending: Internal Medicine | Admitting: Internal Medicine

## 2021-09-16 DIAGNOSIS — S0990XA Unspecified injury of head, initial encounter: Secondary | ICD-10-CM | POA: Diagnosis not present

## 2021-09-16 DIAGNOSIS — R413 Other amnesia: Secondary | ICD-10-CM | POA: Diagnosis not present

## 2021-09-16 DIAGNOSIS — J341 Cyst and mucocele of nose and nasal sinus: Secondary | ICD-10-CM | POA: Diagnosis not present

## 2021-09-16 DIAGNOSIS — R040 Epistaxis: Secondary | ICD-10-CM | POA: Diagnosis not present

## 2021-09-16 NOTE — Telephone Encounter (Signed)
Patient's daughter called to see if the Iron, Ferrous Sulfate, 325 (65 Fe) MG TABS can be in liquid form to help with stomach issues. If not, patient would like to know whether the iron will be a prescription or if they can just buy it over the counter     Good callback for daughter is [phone]     Please advise

## 2021-09-16 NOTE — Telephone Encounter (Signed)
Since medication is OTC, instructed daughter to ask pharmacy if there are OTC liquid options available. Daughter verb understanding.

## 2021-09-17 ENCOUNTER — Other Ambulatory Visit (INDEPENDENT_AMBULATORY_CARE_PROVIDER_SITE_OTHER): Payer: Medicare Other

## 2021-09-17 ENCOUNTER — Ambulatory Visit (INDEPENDENT_AMBULATORY_CARE_PROVIDER_SITE_OTHER): Payer: Medicare Other

## 2021-09-17 ENCOUNTER — Ambulatory Visit: Payer: Medicare Other

## 2021-09-17 DIAGNOSIS — E538 Deficiency of other specified B group vitamins: Secondary | ICD-10-CM | POA: Diagnosis not present

## 2021-09-17 DIAGNOSIS — D509 Iron deficiency anemia, unspecified: Secondary | ICD-10-CM

## 2021-09-17 LAB — IBC PANEL
Iron: 15 ug/dL — ABNORMAL LOW (ref 42–145)
Saturation Ratios: 3.4 % — ABNORMAL LOW (ref 20.0–50.0)
TIBC: 435.4 ug/dL (ref 250.0–450.0)
Transferrin: 311 mg/dL (ref 212.0–360.0)

## 2021-09-17 LAB — FERRITIN: Ferritin: 7.1 ng/mL — ABNORMAL LOW (ref 10.0–291.0)

## 2021-09-17 MED ORDER — CYANOCOBALAMIN 1000 MCG/ML IJ SOLN
1000.0000 ug | INTRAMUSCULAR | Status: AC
  Administered 2021-09-17 – 2021-09-24 (×2): 1000 ug via INTRAMUSCULAR

## 2021-09-17 NOTE — Progress Notes (Signed)
Pt here for weekly B12 injection #1 of 4 per Dr Jerilee Hoh.  B12 108mcg given IM left ventrogluteral and pt tolerated injection well.  Next B12 injection scheduled for 09/24/21 during CPE.

## 2021-09-19 NOTE — Telephone Encounter (Signed)
Tammy [name] has questions regarding equivalency of elemental iron to ferrous sulfate. Advised to speak with pharmacist to be 100% sure, but let her know that ferrous sulfate 325mg  equates to 65mg  of elemental iron. Tammy [name] states she will also confirm with pharmacist.

## 2021-09-24 ENCOUNTER — Ambulatory Visit (INDEPENDENT_AMBULATORY_CARE_PROVIDER_SITE_OTHER): Payer: Medicare Other | Admitting: Internal Medicine

## 2021-09-24 VITALS — BP 124/64 | HR 83 | Temp 97.4°F | Ht 60.24 in | Wt 178.7 lb

## 2021-09-24 DIAGNOSIS — E538 Deficiency of other specified B group vitamins: Secondary | ICD-10-CM

## 2021-09-24 DIAGNOSIS — Z0000 Encounter for general adult medical examination without abnormal findings: Secondary | ICD-10-CM

## 2021-09-24 DIAGNOSIS — Z13820 Encounter for screening for osteoporosis: Secondary | ICD-10-CM

## 2021-09-24 DIAGNOSIS — Z23 Encounter for immunization: Secondary | ICD-10-CM | POA: Diagnosis not present

## 2021-09-24 DIAGNOSIS — D509 Iron deficiency anemia, unspecified: Secondary | ICD-10-CM | POA: Diagnosis not present

## 2021-09-24 DIAGNOSIS — I10 Essential (primary) hypertension: Secondary | ICD-10-CM

## 2021-09-24 DIAGNOSIS — H9190 Unspecified hearing loss, unspecified ear: Secondary | ICD-10-CM

## 2021-09-24 DIAGNOSIS — E785 Hyperlipidemia, unspecified: Secondary | ICD-10-CM | POA: Diagnosis not present

## 2021-09-24 DIAGNOSIS — R7303 Prediabetes: Secondary | ICD-10-CM | POA: Diagnosis not present

## 2021-09-24 NOTE — Patient Instructions (Signed)
-  Nice seeing you today!!  -Return fasting for labs.  -B12 injection today.  -Pneumonia vaccine today.  -Remember your tdap and shingles vaccines at the pharmacy.  -Schedule follow up in 6 months or sooner as needed.

## 2021-09-24 NOTE — Progress Notes (Signed)
Established Patient Office Visit     This visit occurred during the SARS-CoV-2 public health emergency.  Safety protocols were in place, including screening questions prior to the visit, additional usage of staff PPE, and extensive cleaning of exam room while observing appropriate contact time as indicated for disinfecting solutions.    CC/Reason for Visit: Annual preventive exam and subsequent Medicare wellness visit  HPI: Tammy [name] is a 81 y.o. female who is coming in today for the above mentioned reasons. Past Medical History is significant for: Recently diagnosed iron deficiency anemia and vitamin B12 deficiencies.  Hypertension, GERD, impaired glucose tolerance, morbid obesity, hyperlipidemia, chronic kidney disease stage III.  She has routine eye and dental care.  She has noticed decreased hearing.  She is overdue for conjugated pneumonia vaccine, Tdap and shingles.  She elects to defer all cancer screening due to age.  She is requesting a bone density test.   Past Medical/Surgical History: Past Medical History:  Diagnosis Date   Anxiety    Arthritis    knees and back   Bruised rib 01/18/2014   x-rayed   Chicken pox    DDD (degenerative disc disease), lumbar    Depression    mild, never on medication   Deviated septum    GERD (gastroesophageal reflux disease)    Heart murmur    told in 2012 benign   Hemorrhoids    High cholesterol    borderline, no meds   Hyperglycemia    borderline   Hypertension    high blood pressure readings, no meds   Osteoarthritis    Pre-diabetes    diet- controlled   Rib pain on left side 01/20/2014    xray done seen by MD 01/20/14 in [facility]    Scoliosis    Varicose vein     Past Surgical History:  Procedure Laterality Date   ABDOMINAL HYSTERECTOMY  1972   COLONOSCOPY  05/27/2013   DENTAL SURGERY  2007   left jaw bone graft, 2 implants on left, 2 implants on right   HEMORRHOID BANDING  06/03/13   [place] BREAST ABSCESS Bilateral 1966, 1967   MASS EXCISION N/A 04/22/2013   Procedure: EXCISION MULTPILE soft tissue masses and abnormal skin lesion left thigh /left chest/ left posterior shoulder/ right flank ;  Surgeon: Harl Bowie, MD;  Location: [facility];  Service: General;  Laterality: N/A;  multiple sites:  left thigh, left shoulder, right flank, right upper arm   OVARIAN CYST SURGERY Bilateral 1991   OVARIAN CYST SURGERY     SEPTOPLASTY Bilateral 12/16/2016   Procedure: SEPTOPLASTY;  Surgeon: Leta Baptist, MD;  Location: [facility];  Service: ENT;  Laterality: Bilateral;   [place] Right 07/25/2013   Procedure: RIGHT TOTAL KNEE ARTHROPLASTY;  Surgeon: Gearlean Alf, MD;  Location: [facility];  Service: Orthopedics;  Laterality: Right;  Left knee cortisone injection   TOTAL KNEE ARTHROPLASTY Left 01/30/2014   Procedure: LEFT TOTAL KNEE ARTHROPLASTY;  Surgeon: Gearlean Alf, MD;  Location: [facility];  Service: Orthopedics;  Laterality: Left;   TUBAL LIGATION  1970    Social History:  reports that she has never smoked. She has never used smokeless tobacco. She reports current alcohol use. She reports that she does not use drugs.  Allergies: Allergies  Allergen Reactions   Hydrocodone     Made her feel very strange,  wired, restless  Made her feel very strange, wired, restless   Penicillins Rash    Family History:  Family History  Problem Relation Age of Onset   Alcohol abuse Father    Heart disease Father    Arthritis Mother    Stroke Mother    Hypertension Mother    Diabetes Mother    Breast cancer Maternal Grandmother    Breast cancer Maternal Aunt    Colon cancer Maternal Uncle    Esophageal cancer Neg Hx    Stomach cancer Neg Hx    Rectal cancer Neg Hx      Current Outpatient Medications:    amitriptyline (ELAVIL) 25 MG tablet, Take 25 mg by mouth at bedtime as needed. , Disp: , Rfl:     bisacodyl (DULCOLAX) 5 MG EC tablet, Take 5 mg by mouth in the morning and at bedtime., Disp: , Rfl:    gabapentin (NEURONTIN) 100 MG capsule, at bedtime., Disp: , Rfl:    ipratropium (ATROVENT) 0.06 % nasal spray, ipratropium bromide 42 mcg (0.06 %) nasal spray, Disp: 15 mL, Rfl: 2   Iron, Ferrous Sulfate, 325 (65 Fe) MG TABS, Take 325 mg by mouth 2 (two) times daily., Disp: 30 tablet, Rfl:    Multiple Vitamins-Minerals (MULTI-VITAMIN GUMMIES PO), Take by mouth., Disp: , Rfl:    oxyCODONE (OXY IR/ROXICODONE) 5 MG immediate release tablet, Take 1-2 tablets (5-10 mg total) by mouth every 3 (three) hours as needed for breakthrough pain., Disp: 80 tablet, Rfl: 0   pantoprazole (PROTONIX) 40 MG tablet, TAKE 1 TABLET BY MOUTH EVERY DAY, Disp: 90 tablet, Rfl: 1   traMADol (ULTRAM) 50 MG tablet, Take 1-2 tablets (50-100 mg total) by mouth every 6 (six) hours as needed for moderate pain., Disp: 60 tablet, Rfl: 0   traZODone (DESYREL) 50 MG tablet, TAKE 1/2 TO 1 TABLET BY MOUTH AT BEDTIME AS NEEDED FOR SLEEP, Disp: 30 tablet, Rfl: 1   cyclobenzaprine (FLEXERIL) 10 MG tablet, Take 10 mg by mouth 3 (three) times daily as needed for muscle spasms. (Patient not taking: Reported on 09/24/2021), Disp: , Rfl:    etodolac (LODINE) 400 MG tablet, Take 400 mg by mouth 2 (two) times daily. (Patient not taking: Reported on 09/24/2021), Disp: , Rfl: 1  Current Facility-Administered Medications:    cyanocobalamin ((VITAMIN B-12)) injection 1,000 mcg, 1,000 mcg, Intramuscular, Weekly, Isaac Bliss, Rayford Halsted, MD, 1,000 mcg at 09/24/21 1538  Review of Systems:  Constitutional: Denies fever, chills, diaphoresis, appetite change and fatigue.  HEENT: Denies photophobia, eye pain, redness, hearing loss, ear pain, congestion, sore throat, rhinorrhea, sneezing, mouth sores, trouble swallowing, neck pain, neck stiffness and tinnitus.   Respiratory: Denies SOB, DOE, cough, chest tightness,  and wheezing.   Cardiovascular: Denies  chest pain, palpitations and leg swelling.  Gastrointestinal: Denies nausea, vomiting, abdominal pain, diarrhea, constipation, blood in stool and abdominal distention.  Genitourinary: Denies dysuria, urgency, frequency, hematuria, flank pain and difficulty urinating.  Endocrine: Denies: hot or cold intolerance, sweats, changes in hair or nails, polyuria, polydipsia. Musculoskeletal: Denies myalgias, back pain, joint swelling, arthralgias and gait problem.  Skin: Denies pallor, rash and wound.  Neurological: Denies dizziness, seizures, syncope, weakness, light-headedness, numbness and headaches.  Hematological: Denies adenopathy. Easy bruising, personal or family bleeding history  Psychiatric/Behavioral: Denies suicidal ideation, mood changes, confusion, nervousness, sleep disturbance and agitation    Physical Exam: Vitals:   09/24/21 1537  BP: 124/64  Pulse: 83  Temp: (!) 97.4 F (36.3 C)  TempSrc: Oral  SpO2: 94%  Weight: 178 lb 11.2 oz (81.1 kg)  Height: 5' 0.24" (1.53 m)    Body mass index is 34.63 kg/m.   Constitutional: NAD, calm, comfortable Eyes: PERRL, lids and conjunctivae normal ENMT: Mucous membranes are moist. Posterior pharynx clear of any exudate or lesions. Normal dentition. Tympanic membrane is pearly white, no erythema or bulging. Neck: normal, supple, no masses, no thyromegaly Respiratory: clear to auscultation bilaterally, no wheezing, no crackles. Normal respiratory effort. No accessory muscle use.  Cardiovascular: Regular rate and rhythm, no murmurs / rubs / gallops. No extremity edema. 2+ pedal pulses. No carotid bruits.  Abdomen: no tenderness, no masses palpated. No hepatosplenomegaly. Bowel sounds positive.  Musculoskeletal: no clubbing / cyanosis. No joint deformity upper and lower extremities. Good ROM, no contractures. Normal muscle tone.  Skin: no rashes, lesions, ulcers. No induration Neurologic: CN 2-12 grossly intact. Sensation intact, DTR normal.  Strength 5/5 in all 4.  Psychiatric: Normal judgment and insight. Alert and oriented x 3. Normal mood.    Subsequent Medicare wellness visit   1. Risk factors, based on past  M,S,F -cardiovascular disease risk factors include age, history of hypertension, hyperlipidemia   2.  Physical activities: Very sedentary   3.  Depression/mood: Depressed mood, counseling advised   4.  Hearing: Decreased hearing bilaterally   5.  ADL's: Independent in all ADLs   6.  Fall risk: High fall risk due to previous falls   7.  Home safety: No problems identified   8.  Height weight, and visual acuity: height and weight as above, vision:  Vision Screening   Right eye Left eye Both eyes  Without correction 20/30 20/150 20/30  With correction        9.  Counseling: Advise she update her vaccination status   10. Lab orders based on risk factors: Laboratory update will be reviewed   11. Referral : DEXA scan, audiology   12. Care plan: Follow-up with me in 6 months or sooner as needed   13. Cognitive assessment: Mild cognitive impairment   14. Screening: Patient provided with a written and personalized 5-10 year screening schedule in the AVS. yes   15. Provider List Update: PCP, ophthalmologist  16. Advance Directives: Full code   17. Opioids: Patient is not on any opioid prescriptions and has no risk factors for a substance use disorder.   [place] Office Visit from 05/22/2021 in [place] at [place]  PHQ-9 Total Score 11       Fall Risk 03/22/2013 01/20/2020 08/27/2021 09/24/2021  Falls in the past year? No 1 - 1  Was there an injury with Fall? - 0 - 1  Fall Risk Category Calculator - 2 - 3  Fall Risk Category - Moderate - High  Patient Fall Risk Level - - Moderate fall risk High fall risk  Patient at Risk for Falls Due to - - - History of fall(s)  Fall risk Follow up - - - Falls evaluation completed;Falls prevention discussed     Impression and Plan:  Encounter  for preventive health examination -Recommend routine eye and dental care. -Immunizations: PCV 13 today, she will get Tdap and shingles at pharmacy -Healthy lifestyle discussed in detail. -Labs to be updated today. -Colon cancer screening: Defers due to age -Breast cancer screening: Defers due to age -Cervical cancer screening: Defers due to age -Lung cancer screening: Not applicable -Prostate cancer screening: Not applicable -DEXA: Order placed  Vitamin B12 deficiency -On IM B12 supplementation  Need  for vaccination against Streptococcus pneumoniae using pneumococcal conjugate vaccine 13  - Plan: Pneumococcal conjugate vaccine 13-valent  Osteoporosis screening  - Plan: DG BONE DENSITY (DXA)  Hearing loss, unspecified hearing loss type, unspecified laterality  - Plan: Ambulatory referral to Audiology  Microcytic anemia/iron deficiency anemia -On ferrous sulfate 325 mg twice daily.  Essential hypertension, benign -Well-controlled  Hyperlipidemia, mild  - Plan: Lipid panel  Prediabetes  - Plan: Hemoglobin A1c    Patient Instructions  -Nice seeing you today!!  -Return fasting for labs.  -B12 injection today.  -Pneumonia vaccine today.  -Remember your tdap and shingles vaccines at the pharmacy.  -Schedule follow up in 6 months or sooner as needed.      Lelon Frohlich, MD [facility] at [facility]

## 2021-09-24 NOTE — Addendum Note (Signed)
Addended by: Rosalyn Gess D on: 09/24/2021 05:04 PM   Modules accepted: Orders

## 2021-09-25 ENCOUNTER — Other Ambulatory Visit (INDEPENDENT_AMBULATORY_CARE_PROVIDER_SITE_OTHER): Payer: Medicare Other

## 2021-09-25 DIAGNOSIS — E785 Hyperlipidemia, unspecified: Secondary | ICD-10-CM

## 2021-09-25 DIAGNOSIS — R7303 Prediabetes: Secondary | ICD-10-CM

## 2021-09-25 LAB — LIPID PANEL
Cholesterol: 212 mg/dL — ABNORMAL HIGH (ref 0–200)
HDL: 43.2 mg/dL (ref >39.00)
LDL Cholesterol: 133 mg/dL — ABNORMAL HIGH (ref 0–99)
NonHDL: 168.81
Total CHOL/HDL Ratio: 5
Triglycerides: 180 mg/dL — ABNORMAL HIGH (ref 0.0–149.0)
VLDL: 36 mg/dL (ref 0.0–40.0)

## 2021-09-25 LAB — HEMOGLOBIN A1C: Hgb A1c MFr Bld: 5.8 % (ref 4.6–6.5)

## 2021-09-26 ENCOUNTER — Other Ambulatory Visit: Payer: Self-pay

## 2021-09-26 ENCOUNTER — Other Ambulatory Visit: Payer: Self-pay | Admitting: Internal Medicine

## 2021-09-26 DIAGNOSIS — E785 Hyperlipidemia, unspecified: Secondary | ICD-10-CM

## 2021-09-26 DIAGNOSIS — D649 Anemia, unspecified: Secondary | ICD-10-CM

## 2021-09-26 MED ORDER — ATORVASTATIN CALCIUM 20 MG PO TABS: 20.0000 mg | ORAL_TABLET | Freq: Every day | ORAL | 1 refills | Status: DC

## 2021-10-01 ENCOUNTER — Ambulatory Visit (INDEPENDENT_AMBULATORY_CARE_PROVIDER_SITE_OTHER): Payer: Medicare Other | Admitting: *Deleted

## 2021-10-01 DIAGNOSIS — E538 Deficiency of other specified B group vitamins: Secondary | ICD-10-CM

## 2021-10-01 DIAGNOSIS — R040 Epistaxis: Secondary | ICD-10-CM | POA: Diagnosis not present

## 2021-10-01 MED ORDER — CYANOCOBALAMIN 1000 MCG/ML IJ SOLN
1000.0000 ug | Freq: Once | INTRAMUSCULAR | Status: AC
  Administered 2021-10-01: 1000 ug via INTRAMUSCULAR

## 2021-10-01 NOTE — Progress Notes (Signed)
Per orders of Dr. Jerilee Hoh, injection of Cyanocobalamin 1000mg  given by Agnes Lawrence. Patient tolerated injection well.

## 2021-10-02 ENCOUNTER — Other Ambulatory Visit: Payer: Self-pay

## 2021-10-02 ENCOUNTER — Encounter: Payer: Self-pay | Admitting: Physician Assistant

## 2021-10-02 ENCOUNTER — Ambulatory Visit: Payer: Medicare Other | Admitting: Physician Assistant

## 2021-10-02 VITALS — BP 116/71 | HR 66 | Resp 18 | Ht 61.0 in | Wt 175.0 lb

## 2021-10-02 DIAGNOSIS — R413 Other amnesia: Secondary | ICD-10-CM | POA: Diagnosis not present

## 2021-10-02 NOTE — Progress Notes (Signed)
Assessment/Plan:   Tammy [name] is a very pleasant 81 y.o. year old RH female with  a history of hypertension, hyperlipidemia, depression, iron deficiency anemia, B12 deficiency, arthritis, seen today for evaluation of memory loss. MoCA today 28/30 without deficiencies in delayed recall 5/5 and orientation is normal.  Her cognitive difficulties, may be exacerbated by her chronic issues, which are now being corrected as she is taking iron supplements and B12 injections.  Memory difficulties  MRI brain with/without contrast to assess for underlying structural abnormality and assess vascular load  Discussed safety both in and out of the home.  Discussed the importance of regular daily schedule to maintain brain function.  Continue to monitor mood with PCP.  Monitor Driving Stay active at least 30 minutes at least 3 times a week.  Naps should be scheduled and should be no longer than 60 minutes and should not occur after 2 PM.  Control cardiovascular risk factors  Control of her iron and third B12 deficiency by PCP Mediterranean diet is recommended  Folllow up in 6 months  Subjective:    The patient is seen in neurologic consultation at the request of Leotis Shames* for the evaluation of memory.  The patient is accompanied by her daughter who supplements the history. This is a delightful 81 y.o. 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  She reports that she was caring for an old lady and had to begin writing and at least the findings on the things to do, because she was having difficulty remembering them.  She also was feeling much more tired than prior.  "I used to be much heavier, I decided to lose weight, lost 25 pounds in a diet and I still was tired and my memory was not good ".  She states that she could not compute, could not repeat what is was being said.  This was troubling to her, causing some depression.   She denies any difficulties with long-term memory.  She lives alone, and is fairly independent.  She does write everything since that incident.  She does her own cooking, cleaning and laundry, but she becomes very short of breath when going in heels, which was felt to be due to iron deficiency.  She is not disoriented when walking into her room.  She denies leaving objects in unusual places.  She ambulates without significant difficulties.  She had a recent fall which brought her to the ED recently, after her toe was caught in the pillow and "took a nose dive hurting the left knee and the right shoulder ".  She had a nosebleed, but did not have any fractures.  She denies wandering off.  She continues to drive short distances, if she has to do a long distance, she uses a GPS.  She denies irritability.  She does not do crossword puzzles or word finding often.  She does like to watch Jeopardy.  Inform her night nurse, she feels that her sleep cycle has always been different.  She goes to bed at 11 PM, and she may be waking up an hour or 2 later.  She never had a sleep study.  She states that she could be up again at 4 in the morning.  She does feel slightly tired when waking up, and denies sleepwalking, hallucinations or paranoia.  When she sleeps she has vivid dreams.  There are no hygiene concerns, she is  independent of bathing and dressing, and she puts the medications in a pillbox without missing doses.  Her daughter manages the finances.  Her appetite is good, denies trouble swallowing.  She cooks, randomly, she may have seldom, she may have left the stove on accidentally.  She denies any headaches, double vision, dizziness, focal numbness or tingling, unilateral weakness, tremors or anosmia.  She complains of her feet being very cold.  No history of seizures.  Denies any urine incontinence, retention, constipation or diarrhea, sleep apnea, alcohol or tobacco history, family history remarkable for mother with  dementia.   CT of the head without contrast 09/16/2018 no acute intracranial pathology.2. Moderate chronic white matter microangiopathy.  Allergies  Allergen Reactions   Hydrocodone     Made her feel very strange, wired, restless  Made her feel very strange, wired, restless   Penicillins Rash    Current Outpatient Medications  Medication Instructions   amitriptyline (ELAVIL) 25 mg, Oral, At bedtime PRN   atorvastatin (LIPITOR) 20 mg, Oral, Daily   bisacodyl (DULCOLAX) 5 mg, Oral, 2 times daily   cholecalciferol (VITAMIN D3) 1,000 Units, Oral, Daily   cyclobenzaprine (FLEXERIL) 10 mg, 3 times daily PRN   etodolac (LODINE) 400 mg, 2 times daily   gabapentin (NEURONTIN) 100 MG capsule Daily at bedtime   ipratropium (ATROVENT) 0.06 % nasal spray ipratropium bromide 42 mcg (0.06 %) nasal spray   Iron (Ferrous Sulfate) 325 mg, Oral, 2 times daily   Multiple Vitamins-Minerals (MULTI-VITAMIN GUMMIES PO) Oral   oxyCODONE (OXY IR/ROXICODONE) 5-10 mg, Oral, Every  3 hours PRN   pantoprazole (PROTONIX) 40 MG tablet TAKE 1 TABLET BY MOUTH EVERY DAY   traMADol (ULTRAM) 50-100 mg, Oral, Every 6 hours PRN   traZODone (DESYREL) 50 MG tablet TAKE 1/2 TO 1 TABLET BY MOUTH AT BEDTIME AS NEEDED FOR SLEEP     VITALS:   Vitals:   10/02/21 1329  BP: 116/71  Pulse: 66  Resp: 18  SpO2: 98%  Weight: 175 lb (79.4 kg)  Height: 5\' 1"  (1.549 m)     PHYSICAL EXAM   HEENT:  Normocephalic, atraumatic. The mucous membranes are moist. The superficial temporal arteries are without ropiness or tenderness. Cardiovascular: Regular rate and rhythm. Lungs: Clear to auscultation bilaterally. Neck: There are no carotid bruits noted bilaterally.  NEUROLOGICAL: Montreal Cognitive Assessment  10/02/2021  Visuospatial/ Executive (0/5) 4  Naming (0/3) 2  Attention: Read list of digits (0/2) 2  Attention: Read list of letters (0/1) 1  Attention: Serial 7 subtraction starting at 100 (0/3) 3  Language: Repeat  phrase (0/2) 2  Language : Fluency (0/1) 1  Abstraction (0/2) 2  Delayed Recall (0/5) 5  Orientation (0/6) 6  Total 28  Adjusted Score (based on education) 28   No flowsheet data found.  No flowsheet data found.   Orientation:  Alert and oriented to person, place and time. No aphasia or dysarthria. Fund of knowledge is appropriate. Recent memory impaired and remote memory intact.  Attention and concentration are normal.  Able to name objects and repeat phrases. Delayed recall   Cranial nerves: There is good facial symmetry. Extraocular muscles are intact and visual fields are full to confrontational testing. Speech is fluent and clear. Soft palate rises symmetrically and there is no tongue deviation.  No macroglossia is noted. hearing is intact to conversational tone. Tone: Tone is good throughout. Sensation: Sensation is intact to light touch and pinprick throughout. Vibration is intact at the bilateral big toe.There  is no extinction with double simultaneous stimulation. There is no sensory dermatomal level identified. Coordination: The patient has no difficulty with RAM's or FNF bilaterally. Normal finger to nose  Motor: Strength is 5/5 in the bilateral upper and lower extremities. There is no pronator drift. There are no fasciculations noted. DTR's: Deep tendon reflexes are 2/4 at the bilateral biceps, triceps, brachioradialis, patella and achilles.  Plantar responses are downgoing bilaterally. Gait and Station: The patient is able to ambulate without difficulty.The patient is able to heel toe walk without any difficulty.The patient is able to ambulate in a tandem fashion. The patient is able to stand in the Romberg position.     Thank you for allowing [place] the opportunity to participate in the care of this nice patient. Please do not hesitate to contact us for any questions or concerns.   Total time spent on today's visit was 60 minutes, including both face-to-face time and nonface-to-face  time.  Time included that spent on review of records (prior notes available to me/labs/imaging if pertinent), discussing treatment and goals, answering patient's questions and coordinating care.  Cc:  Isaac Bliss, Rayford Halsted, MD  Sharene Butters 10/02/2021 2:27 PM

## 2021-10-02 NOTE — Patient Instructions (Addendum)
It was a pleasure to see you today at our office.   Recommendations:  MRI of the brain, the radiology office will call you to arrange you appointment Follow up 6 months   RECOMMENDATIONS FOR ALL PATIENTS WITH MEMORY PROBLEMS: 1. Continue to exercise (Recommend 30 minutes of walking everyday, or 3 hours every week) 2. Increase social interactions - continue going to [place] and enjoy social gatherings with friends and family 3. Eat healthy, avoid fried foods and eat more fruits and vegetables 4. Maintain adequate blood pressure, blood sugar, and blood cholesterol level. Reducing the risk of stroke and cardiovascular disease also helps promoting better memory. 5. Avoid stressful situations. Live a simple life and avoid aggravations. Organize your time and prepare for the next day in anticipation. 6. Sleep well, avoid any interruptions of sleep and avoid any distractions in the bedroom that may interfere with adequate sleep quality 7. Avoid sugar, avoid sweets as there is a strong link between excessive sugar intake, diabetes, and cognitive impairment We discussed the Mediterranean diet, which has been shown to help patients reduce the risk of progressive memory disorders and reduces cardiovascular risk. This includes eating fish, eat fruits and green leafy vegetables, nuts like almonds and hazelnuts, walnuts, and also use olive oil. Avoid fast foods and fried foods as much as possible. Avoid sweets and sugar as sugar use has been linked to worsening of memory function.  There is always a concern of gradual progression of memory problems. If this is the case, then we may need to adjust level of care according to patient needs. Support, both to the patient and caregiver, should then be put into place.     FALL PRECAUTIONS: Be cautious when walking. Scan the area for obstacles that may increase the risk of trips and falls. When getting up in the mornings, sit up at the edge of the bed for a few  minutes before getting out of bed. Consider elevating the bed at the head end to avoid drop of blood pressure when getting up. Walk always in a well-lit room (use night lights in the walls). Avoid area rugs or power cords from appliances in the middle of the walkways. Use a walker or a cane if necessary and consider physical therapy for balance exercise. Get your eyesight checked regularly.  FINANCIAL OVERSIGHT: Supervision, especially oversight when making financial decisions or transactions is also recommended.  HOME SAFETY: Consider the safety of the kitchen when operating appliances like stoves, microwave oven, and blender. Consider having supervision and share cooking responsibilities until no longer able to participate in those. Accidents with firearms and other hazards in the house should be identified and addressed as well.   ABILITY TO BE LEFT ALONE: If patient is unable to contact 911 operator, consider using LifeLine, or when the need is there, arrange for someone to stay with patients. Smoking is a fire hazard, consider supervision or cessation. Risk of wandering should be assessed by caregiver and if detected at any point, supervision and safe proof recommendations should be instituted.  MEDICATION SUPERVISION: Inability to self-administer medication needs to be constantly addressed. Implement a mechanism to ensure safe administration of the medications.   DRIVING: Regarding driving, in patients with progressive memory problems, driving will be impaired. We advise to have someone else do the driving if trouble finding directions or if minor accidents are reported. Independent driving assessment is available to determine safety of driving.   If you are interested in the driving assessment, you can  contact the following:  [employer] in [place]  [place] [phone]  or [phone]    [place] refers to food and lifestyle choices that are based on the traditions of countries located on the [employer]. This way of eating has been shown to help prevent certain conditions and improve outcomes for people who have chronic diseases, like kidney disease and heart disease. What are tips for following this plan? Lifestyle  Cook and eat meals together with your family, when possible. Drink enough fluid to keep your urine clear or pale yellow. Be physically active every day. This includes: Aerobic exercise like running or swimming. Leisure activities like gardening, walking, or housework. Get 7-8 hours of sleep each night. If recommended by your health care provider, drink red wine in moderation. This means 1 glass a day for nonpregnant women and 2 glasses a day for men. A glass of wine equals 5 oz (150 mL). Reading food labels  Check the serving size of packaged foods. For foods such as rice and pasta, the serving size refers to the amount of cooked product, not dry. Check the total fat in packaged foods. Avoid foods that have saturated fat or trans fats. Check the ingredients list for added sugars, such as corn syrup. Shopping  At the grocery store, buy most of your food from the areas near the walls of the store. This includes: Fresh fruits and vegetables (produce). Grains, beans, nuts, and seeds. Some of these may be available in unpackaged forms or large amounts (in bulk). Fresh seafood. Poultry and eggs. Low-fat dairy products. Buy whole ingredients instead of prepackaged foods. Buy fresh fruits and vegetables in-season from local farmers markets. Buy frozen fruits and vegetables in resealable bags. If you do not have access to quality fresh seafood, buy precooked frozen shrimp or canned fish, such as tuna, salmon, or sardines. Buy small amounts of raw or cooked vegetables, salads, or olives from the deli or salad  bar at your store. Stock your pantry so you always have certain foods on hand, such as olive oil, canned tuna, canned tomatoes, rice, pasta, and beans. Cooking  Cook foods with extra-virgin olive oil instead of using butter or other vegetable oils. Have meat as a side dish, and have vegetables or grains as your main dish. This means having meat in small portions or adding small amounts of meat to foods like pasta or stew. Use beans or vegetables instead of meat in common dishes like chili or lasagna. Experiment with different cooking methods. Try roasting or broiling vegetables instead of steaming or sauteing them. Add frozen vegetables to soups, stews, pasta, or rice. Add nuts or seeds for added healthy fat at each meal. You can add these to yogurt, salads, or vegetable dishes. Marinate fish or vegetables using olive oil, lemon juice, garlic, and fresh herbs. Meal planning  Plan to eat 1 vegetarian meal one day each week. Try to work up to 2 vegetarian meals, if possible. Eat seafood 2 or more times a week. Have healthy snacks readily available, such as: Vegetable sticks with hummus. Greek yogurt. Fruit and nut trail mix. Eat balanced meals throughout the week. This includes: Fruit: 2-3 servings a day Vegetables: 4-5 servings a day Low-fat dairy: 2 servings a day Fish, poultry, or lean meat: 1 serving a day Beans and legumes: 2 or more servings a week Nuts and seeds: 1-2 servings a day  Whole grains: 6-8 servings a day Extra-virgin olive oil: 3-4 servings a day Limit red meat and sweets to only a few servings a month What are my food choices? Mediterranean diet Recommended Grains: Whole-grain pasta. Brown rice. Bulgar wheat. Polenta. Couscous. Whole-wheat bread. [place]. Vegetables: Artichokes. Beets. Broccoli. Cabbage. Carrots. Eggplant. Green beans. Chard. Kale. Spinach. Onions. Leeks. Peas. Squash. Tomatoes. Peppers. Radishes. Fruits: Apples. Apricots. Avocado. Berries.  Bananas. Cherries. Dates. Figs. Grapes. Lemons. Melon. Oranges. Peaches. Plums. Pomegranate. Meats and other protein foods: Beans. Almonds. Sunflower seeds. Pine nuts. Peanuts. [place]. Salmon. Scallops. Shrimp. [place]. Tilapia. Clams. Oysters. Eggs. Dairy: Low-fat milk. Cheese. Greek yogurt. Beverages: Water. Red wine. Herbal tea. Fats and oils: Extra virgin olive oil. Avocado oil. Grape seed oil. Sweets and desserts: [place] yogurt with honey. Baked apples. Poached pears. Trail mix. Seasoning and other foods: Basil. Cilantro. Coriander. Cumin. Mint. Parsley. Sage. Rosemary. Tarragon. Garlic. Oregano. Thyme. Pepper. Balsalmic vinegar. Tahini. Hummus. Tomato sauce. Olives. Mushrooms. Limit these Grains: Prepackaged pasta or rice dishes. Prepackaged cereal with added sugar. Vegetables: Deep fried potatoes (french fries). Fruits: Fruit canned in syrup. Meats and other protein foods: Beef. Pork. Lamb. Poultry with skin. Hot dogs. Berniece Salines. Dairy: Ice cream. Sour cream. Whole milk. Beverages: Juice. Sugar-sweetened soft drinks. Beer. Liquor and spirits. Fats and oils: Butter. Canola oil. Vegetable oil. Beef fat (tallow). Lard. Sweets and desserts: Cookies. Cakes. Pies. Candy. Seasoning and other foods: Mayonnaise. Premade sauces and marinades. The items listed may not be a complete list. Talk with your dietitian about what dietary choices are right for you. Summary The Mediterranean diet includes both food and lifestyle choices. Eat a variety of fresh fruits and vegetables, beans, nuts, seeds, and whole grains. Limit the amount of red meat and sweets that you eat. Talk with your health care provider about whether it is safe for you to drink red wine in moderation. This means 1 glass a day for nonpregnant women and 2 glasses a day for men. A glass of wine equals 5 oz (150 mL). This information is not intended to replace advice given to you by your health care provider. Make sure you discuss any questions you  have with your health care provider. Document Released: 04/03/2016 Document Revised: 05/06/2016 Document Reviewed: 04/03/2016 Elsevier Interactive Patient Education  2017 [employer].  .cre

## 2021-10-08 ENCOUNTER — Ambulatory Visit (INDEPENDENT_AMBULATORY_CARE_PROVIDER_SITE_OTHER): Payer: Medicare Other

## 2021-10-08 DIAGNOSIS — E538 Deficiency of other specified B group vitamins: Secondary | ICD-10-CM | POA: Diagnosis not present

## 2021-10-08 MED ORDER — CYANOCOBALAMIN 1000 MCG/ML IJ SOLN
1000.0000 ug | Freq: Once | INTRAMUSCULAR | Status: AC
  Administered 2021-10-08: 1000 ug via INTRAMUSCULAR

## 2021-10-08 NOTE — Progress Notes (Signed)
Per orders of Dr. Hernandez, injection of B12 given by Lamonica Trueba. Patient tolerated injection well.  

## 2021-10-10 ENCOUNTER — Telehealth: Payer: Self-pay

## 2021-10-10 ENCOUNTER — Other Ambulatory Visit: Payer: Self-pay

## 2021-10-10 ENCOUNTER — Ambulatory Visit
Admission: RE | Admit: 2021-10-10 | Discharge: 2021-10-10 | Disposition: A | Discharge status: Home / Self Care | Payer: Medicare Other | Source: Ambulatory Visit | Attending: Physician Assistant | Admitting: Physician Assistant

## 2021-10-10 DIAGNOSIS — G319 Degenerative disease of nervous system, unspecified: Secondary | ICD-10-CM | POA: Diagnosis not present

## 2021-10-10 DIAGNOSIS — I6782 Cerebral ischemia: Secondary | ICD-10-CM | POA: Diagnosis not present

## 2021-10-10 DIAGNOSIS — S0083XA Contusion of other part of head, initial encounter: Secondary | ICD-10-CM | POA: Diagnosis not present

## 2021-10-10 DIAGNOSIS — R413 Other amnesia: Secondary | ICD-10-CM | POA: Diagnosis not present

## 2021-10-10 NOTE — Telephone Encounter (Signed)
Patient called requesting Rx refill  Last Ov 09/24/21 Last filled 08/30/21 traZODone (DESYREL) 50 MG tablet [id number] [address], [place] [address] Phone:  [phone]  Fax:  [phone]

## 2021-10-14 ENCOUNTER — Other Ambulatory Visit: Payer: Self-pay | Admitting: Internal Medicine

## 2021-10-14 DIAGNOSIS — G4700 Insomnia, unspecified: Secondary | ICD-10-CM

## 2021-10-14 MED ORDER — TRAZODONE HCL 50 MG PO TABS: 25.0000 mg | ORAL_TABLET | Freq: Every evening | ORAL | 1 refills | Status: DC | PRN

## 2021-10-16 ENCOUNTER — Telehealth: Payer: Self-pay | Admitting: Internal Medicine

## 2021-10-16 NOTE — Telephone Encounter (Signed)
Patient called in requesting to speak with Dr.Hernandez or a CMA regarding a EKG she had done on 01/06 at the Emergency Room.   Offered patient an appointment because results aren't in our system on my side. Patient declined appointment.  Informed patient that she will probably need to schedule an appointment but she wanted me to just send a message to the back.  Please advise.

## 2021-10-17 ENCOUNTER — Telehealth: Payer: Self-pay | Admitting: Internal Medicine

## 2021-10-17 NOTE — Telephone Encounter (Signed)
Left message on machine for patient to schedule an office or virtual visit for EKG results.

## 2021-10-17 NOTE — Telephone Encounter (Signed)
Pt call and stated she is calling Apolonio Schneiders back and want a call back about her ekg and want to pick up a copy.

## 2021-10-18 NOTE — Telephone Encounter (Signed)
Patient called in requesting to speak with someone. Informed her of message below and offered to schedule her an appointment on Monday but she declined.  Patient will now be calling the hospital to see if they could help read the results to her.  Please advise.

## 2021-10-22 DIAGNOSIS — H903 Sensorineural hearing loss, bilateral: Secondary | ICD-10-CM | POA: Diagnosis not present

## 2021-10-24 ENCOUNTER — Encounter: Payer: Self-pay | Admitting: Internal Medicine

## 2021-10-24 ENCOUNTER — Ambulatory Visit (INDEPENDENT_AMBULATORY_CARE_PROVIDER_SITE_OTHER): Payer: Medicare Other | Admitting: Internal Medicine

## 2021-10-24 VITALS — BP 180/85 | HR 79

## 2021-10-24 DIAGNOSIS — I10 Essential (primary) hypertension: Secondary | ICD-10-CM | POA: Diagnosis not present

## 2021-10-24 DIAGNOSIS — H43812 Vitreous degeneration, left eye: Secondary | ICD-10-CM | POA: Diagnosis not present

## 2021-10-24 MED ORDER — VALSARTAN-HYDROCHLOROTHIAZIDE 160-25 MG PO TABS: 1.0000 | ORAL_TABLET | Freq: Every day | ORAL | 1 refills | Status: DC

## 2021-10-24 NOTE — Progress Notes (Signed)
? ? ? ?Established Patient Office Visit ? ? ? ? ?This visit occurred during the SARS-CoV-2 public health emergency.  Safety protocols were in place, including screening questions prior to the visit, additional usage of staff PPE, and extensive cleaning of exam room while observing appropriate contact time as indicated for disinfecting solutions.  ? ? ?CC/Reason for Visit: Discuss elevated blood pressure, discussed EKG ? ?HPI: Tammy [name] is a 81 y.o. female who is coming in today for the above mentioned reasons.  She was in the emergency department after syncopal event in January and is requesting that I review her EKG.  She has also been noticing elevated blood pressures at home, systolics are usually in the 1 60-1 80.  Although she has a history of hypertension, he has not been taking any medications. ? ?Past Medical/Surgical History: ?Past Medical History:  ?Diagnosis Date  ? Anxiety   ? Arthritis   ? knees and back  ? Bruised rib 01/18/2014  ? x-rayed  ? Chicken pox   ? DDD (degenerative disc disease), lumbar   ? Depression   ? mild, never on medication  ? Deviated septum   ? GERD (gastroesophageal reflux disease)   ? Heart murmur   ? told in 2012 benign  ? Hemorrhoids   ? High cholesterol   ? borderline, no meds  ? Hyperglycemia   ? borderline  ? Hypertension   ? high blood pressure readings, no meds  ? Osteoarthritis   ? Pre-diabetes   ? diet- controlled  ? Rib pain on left side 01/20/2014   ? xray done seen by MD 01/20/14 in [facility]   ? Scoliosis   ? Varicose vein   ? ? ?Past Surgical History:  ?Procedure Laterality Date  ? ABDOMINAL HYSTERECTOMY  1972  ? COLONOSCOPY  05/27/2013  ? DENTAL SURGERY  2007  ? left jaw bone graft, 2 implants on left, 2 implants on right  ? HEMORRHOID BANDING  06/03/13  ? HEMORROIDECTOMY  1975  ? INCISION AND DRAINAGE BREAST ABSCESS Bilateral 1966, 1967  ? MASS EXCISION N/A 04/22/2013  ? Procedure: EXCISION MULTPILE soft tissue masses and abnormal skin lesion left thigh /left  chest/ left posterior shoulder/ right flank ;  Surgeon: Harl Bowie, MD;  Location: [facility];  Service: General;  Laterality: N/A;  multiple sites:  left thigh, left shoulder, right flank, right upper arm  ? OVARIAN CYST SURGERY Bilateral 1991  ? OVARIAN CYST SURGERY    ? SEPTOPLASTY Bilateral 12/16/2016  ? Procedure: SEPTOPLASTY;  Surgeon: Leta Baptist, MD;  Location: [facility];  Service: ENT;  Laterality: Bilateral;  ? [place]  ? TOTAL KNEE ARTHROPLASTY Right 07/25/2013  ? Procedure: RIGHT TOTAL KNEE ARTHROPLASTY;  Surgeon: Gearlean Alf, MD;  Location: [facility];  Service: Orthopedics;  Laterality: Right;  Left knee cortisone injection  ? TOTAL KNEE ARTHROPLASTY Left 01/30/2014  ? Procedure: LEFT TOTAL KNEE ARTHROPLASTY;  Surgeon: Gearlean Alf, MD;  Location: [facility];  Service: Orthopedics;  Laterality: Left;  ? TUBAL LIGATION  1970  ? ? ?Social History: ? reports that she has never smoked. She has never used smokeless tobacco. She reports current alcohol use. She reports that she does not use drugs. ? ?Allergies: ?Allergies  ?Allergen Reactions  ? Hydrocodone   ?  Made her feel very strange, wired, restless ? ?Made her feel very strange, wired, restless  ? Penicillins Rash  ? ? ?Family History:  ?Family History  ?  Problem Relation Age of Onset  ? Alcohol abuse Father   ? Heart disease Father   ? Arthritis Mother   ? Stroke Mother   ? Hypertension Mother   ? Diabetes Mother   ? Breast cancer Maternal Grandmother   ? Breast cancer Maternal Aunt   ? Colon cancer Maternal Uncle   ? Esophageal cancer Neg Hx   ? Stomach cancer Neg Hx   ? Rectal cancer Neg Hx   ? ? ? ?Current Outpatient Medications:  ?  oxymetazoline (AFRIN) 0.05 % nasal spray, Place 1 spray into both nostrils 2 (two) times daily., Disp: , Rfl:  ?  valsartan-hydrochlorothiazide (DIOVAN-HCT) 160-25 MG tablet, Take 1 tablet by mouth daily., Disp: 90 tablet, Rfl: 1 ?  amitriptyline (ELAVIL) 25 MG tablet, Take 25  mg by mouth at bedtime as needed. , Disp: , Rfl:  ?  atorvastatin (LIPITOR) 20 MG tablet, Take 1 tablet (20 mg total) by mouth daily., Disp: 90 tablet, Rfl: 1 ?  bisacodyl (DULCOLAX) 5 MG EC tablet, Take 5 mg by mouth in the morning and at bedtime., Disp: , Rfl:  ?  cholecalciferol (VITAMIN D3) 25 MCG (1000 UNIT) tablet, Take 1,000 Units by mouth daily., Disp: , Rfl:  ?  gabapentin (NEURONTIN) 100 MG capsule, at bedtime., Disp: , Rfl:  ?  Iron, Ferrous Sulfate, 325 (65 Fe) MG TABS, Take 325 mg by mouth 2 (two) times daily., Disp: 30 tablet, Rfl:  ?  Multiple Vitamins-Minerals (MULTI-VITAMIN GUMMIES PO), Take by mouth., Disp: , Rfl:  ?  oxyCODONE (OXY IR/ROXICODONE) 5 MG immediate release tablet, Take 1-2 tablets (5-10 mg total) by mouth every 3 (three) hours as needed for breakthrough pain. (Patient taking differently: Take 10 mg by mouth every 3 (three) hours as needed for breakthrough pain.), Disp: 80 tablet, Rfl: 0 ?  pantoprazole (PROTONIX) 40 MG tablet, TAKE 1 TABLET BY MOUTH EVERY DAY (Patient taking differently: 40 mg every morning.), Disp: 90 tablet, Rfl: 1 ?  traMADol (ULTRAM) 50 MG tablet, Take 1-2 tablets (50-100 mg total) by mouth every 6 (six) hours as needed for moderate pain., Disp: 60 tablet, Rfl: 0 ?  traZODone (DESYREL) 50 MG tablet, Take 0.5-1 tablets (25-50 mg total) by mouth at bedtime as needed. for sleep, Disp: 30 tablet, Rfl: 1 ? ?Review of Systems:  ?Constitutional: Denies fever, chills, diaphoresis, appetite change and fatigue.  ?HEENT: Denies photophobia, eye pain, redness, hearing loss, ear pain, congestion, sore throat, rhinorrhea, sneezing, mouth sores, trouble swallowing, neck pain, neck stiffness and tinnitus.   ?Respiratory: Denies SOB, DOE, cough, chest tightness,  and wheezing.   ?Cardiovascular: Denies chest pain, palpitations and leg swelling.  ?Gastrointestinal: Denies nausea, vomiting, abdominal pain, diarrhea, constipation, blood in stool and abdominal distention.   ?Genitourinary: Denies dysuria, urgency, frequency, hematuria, flank pain and difficulty urinating.  ?Endocrine: Denies: hot or cold intolerance, sweats, changes in hair or nails, polyuria, polydipsia. ?Musculoskeletal: Denies myalgias, back pain, joint swelling, arthralgias and gait problem.  ?Skin: Denies pallor, rash and wound.  ?Neurological: Denies dizziness, seizures, syncope, weakness, light-headedness, numbness and headaches.  ?Hematological: Denies adenopathy. Easy bruising, personal or family bleeding history  ?Psychiatric/Behavioral: Denies suicidal ideation, mood changes, confusion, nervousness, sleep disturbance and agitation ? ? ? ?Physical Exam: ?Vitals:  ? 10/24/21 1342 10/24/21 1403  ?BP: (!) 180/90 (!) 180/85  ?Pulse: 79   ?SpO2: 97%   ? ? ?There is no height or weight on file to calculate BMI. ? ? ?Constitutional: NAD, calm, comfortable ?Eyes: PERRL,  lids and conjunctivae normal ?ENMT: Mucous membranes are moist.  ?Respiratory: clear to auscultation bilaterally, no wheezing, no crackles. Normal respiratory effort. No accessory muscle use.  ?Cardiovascular: Regular rate and rhythm, no murmurs / rubs / gallops. No extremity edema.  ?Psychiatric: Normal judgment and insight. Alert and oriented x 3. Normal mood.  ? ? ?Impression and Plan: ? ?Essential hypertension, benign ? - Plan: valsartan-hydrochlorothiazide (DIOVAN-HCT) 160-25 MG tablet ?-With significant blood pressure elevations both at home and in office, I will go ahead and start her on Diovan HCT daily, she will return in 6 weeks for follow-up with ambulatory blood pressure measurements. ?-EKG from ED has been reviewed that shows a normal sinus rhythm with a rate of around 90, normal axis, no acute ischemic changes. ? ?Time spent: 31 minutes reviewing chart, interviewing and examining patient and formulating plan of care. ? ? ?Patient Instructions  ?-Nice seeing you today!! ? ?-Start diovan HCT 160/25 mg daily. ? ?-Check BP at home 2-3 times  a week. ? ?-Schedule follow up in 6 weeks. ? ? ? ?Lelon Frohlich, MD ?[facility] ? ? ?

## 2021-10-24 NOTE — Patient Instructions (Signed)
-  Nice seeing you today!! ? ?-Start diovan HCT 160/25 mg daily. ? ?-Check BP at home 2-3 times a week. ? ?-Schedule follow up in 6 weeks. ?

## 2021-11-05 ENCOUNTER — Ambulatory Visit (INDEPENDENT_AMBULATORY_CARE_PROVIDER_SITE_OTHER): Payer: Medicare Other

## 2021-11-05 DIAGNOSIS — E538 Deficiency of other specified B group vitamins: Secondary | ICD-10-CM

## 2021-11-05 MED ORDER — CYANOCOBALAMIN 1000 MCG/ML IJ SOLN
1000.0000 ug | INTRAMUSCULAR | Status: AC
  Administered 2021-11-05: 1000 ug via INTRAMUSCULAR

## 2021-11-05 NOTE — Progress Notes (Signed)
Pt here for monthly B12 injection per Dr Jerilee Hoh. ? ?B12 1039mg given IM left ventrogluteal and pt tolerated injection well. ? ?Next B12 injection scheduled for 12/12/21 during OV with PCP. ? ?

## 2021-11-25 ENCOUNTER — Ambulatory Visit (INDEPENDENT_AMBULATORY_CARE_PROVIDER_SITE_OTHER): Payer: Medicare Other | Admitting: Internal Medicine

## 2021-11-25 ENCOUNTER — Telehealth: Payer: Self-pay | Admitting: Internal Medicine

## 2021-11-25 ENCOUNTER — Encounter: Payer: Self-pay | Admitting: Internal Medicine

## 2021-11-25 VITALS — BP 124/82 | HR 85 | Ht 60.0 in | Wt 178.8 lb

## 2021-11-25 DIAGNOSIS — N1831 Chronic kidney disease, stage 3a: Secondary | ICD-10-CM

## 2021-11-25 DIAGNOSIS — I10 Essential (primary) hypertension: Secondary | ICD-10-CM

## 2021-11-25 DIAGNOSIS — R55 Syncope and collapse: Secondary | ICD-10-CM

## 2021-11-25 DIAGNOSIS — D500 Iron deficiency anemia secondary to blood loss (chronic): Secondary | ICD-10-CM

## 2021-11-25 NOTE — Progress Notes (Signed)
? ?OFFICE CONSULT NOTE ? ?Chief Complaint:  ?Manage hypertension ? ?Primary Care Physician: ?Isaac Bliss, Rayford Halsted, MD ? ?HPI:  ?Tammy [name] is a 81 y.o. female who is being seen today for the evaluation of hypertension at the request of Isaac Bliss, [place]*.  This is a pleasant 81 year old female who self-referred for evaluation management of hypertension.  Recently she saw her primary care provider who had helped her manage hypertension.  She was seen in the emergency department in January with episode of epistaxis.  She had syncope during that episode however it sounds like this was primarily vasovagal in the setting of profound anemia.  She is also had some issues with memory loss and has been followed by [facility] neurology.  She tells me that she has stage IIIa chronic kidney disease.  Recently she was seen by her primary care provider noted that she was markedly hypertensive.  She was started on Diovan HCTZ 160/25 mg daily.  She reports compliance with the medication and is actually had very good blood pressure control.  Blood pressure today was 124/82 which is ideal.  She has not had lab work scheduled to follow-up on her renal function.  Her last creatinine was 1.06 in January, but given her age equates to a GFR of 36. ? ?PMHx:  ?Past Medical History:  ?Diagnosis Date  ? Anxiety   ? Arthritis   ? knees and back  ? Bruised rib 01/18/2014  ? x-rayed  ? Chicken pox   ? DDD (degenerative disc disease), lumbar   ? Depression   ? mild, never on medication  ? Deviated septum   ? GERD (gastroesophageal reflux disease)   ? Heart murmur   ? told in 2012 benign  ? Hemorrhoids   ? High cholesterol   ? borderline, no meds  ? Hyperglycemia   ? borderline  ? Hypertension   ? high blood pressure readings, no meds  ? Osteoarthritis   ? Pre-diabetes   ? diet- controlled  ? Rib pain on left side 01/20/2014   ? xray done seen by MD 01/20/14 in [facility]   ? Scoliosis   ? Varicose vein   ? ? ?Past Surgical History:   ?Procedure Laterality Date  ? ABDOMINAL HYSTERECTOMY  1972  ? COLONOSCOPY  05/27/2013  ? DENTAL SURGERY  2007  ? left jaw bone graft, 2 implants on left, 2 implants on right  ? HEMORRHOID BANDING  06/03/13  ? HEMORROIDECTOMY  1975  ? INCISION AND DRAINAGE BREAST ABSCESS Bilateral 1966, 1967  ? MASS EXCISION N/A 04/22/2013  ? Procedure: EXCISION MULTPILE soft tissue masses and abnormal skin lesion left thigh /left chest/ left posterior shoulder/ right flank ;  Surgeon: Harl Bowie, MD;  Location: [facility];  Service: General;  Laterality: N/A;  multiple sites:  left thigh, left shoulder, right flank, right upper arm  ? OVARIAN CYST SURGERY Bilateral 1991  ? OVARIAN CYST SURGERY    ? SEPTOPLASTY Bilateral 12/16/2016  ? Procedure: SEPTOPLASTY;  Surgeon: Leta Baptist, MD;  Location: [facility];  Service: ENT;  Laterality: Bilateral;  ? [place]  ? TOTAL KNEE ARTHROPLASTY Right 07/25/2013  ? Procedure: RIGHT TOTAL KNEE ARTHROPLASTY;  Surgeon: Gearlean Alf, MD;  Location: [facility];  Service: Orthopedics;  Laterality: Right;  Left knee cortisone injection  ? TOTAL KNEE ARTHROPLASTY Left 01/30/2014  ? Procedure: LEFT TOTAL KNEE ARTHROPLASTY;  Surgeon: Gearlean Alf, MD;  Location: [facility];  Service:  Orthopedics;  Laterality: Left;  ? TUBAL LIGATION  1970  ? ? ?FAMHx:  ?Family History  ?Problem Relation Age of Onset  ? Alcohol abuse Father   ? Heart disease Father   ? Arthritis Mother   ? Stroke Mother   ? Hypertension Mother   ? Diabetes Mother   ? Breast cancer Maternal Grandmother   ? Breast cancer Maternal Aunt   ? Colon cancer Maternal Uncle   ? Esophageal cancer Neg Hx   ? Stomach cancer Neg Hx   ? Rectal cancer Neg Hx   ? ? ?SOCHx:  ? reports that she has never smoked. She has never used smokeless tobacco. She reports current alcohol use. She reports that she does not use drugs. ? ?ALLERGIES:  ?Allergies  ?Allergen Reactions  ? Hydrocodone   ?  Made her feel very strange,  wired, restless ? ?Made her feel very strange, wired, restless  ? Penicillins Rash  ? ? ?ROS: ?Pertinent items noted in HPI and remainder of comprehensive ROS otherwise negative. ? ?HOME MEDS: ?Current Outpatient Medications on File Prior to Visit  ?Medication Sig Dispense Refill  ? amitriptyline (ELAVIL) 25 MG tablet Take 25 mg by mouth at bedtime as needed.     ? atorvastatin (LIPITOR) 20 MG tablet Take 1 tablet (20 mg total) by mouth daily. 90 tablet 1  ? bisacodyl (DULCOLAX) 5 MG EC tablet Take 5 mg by mouth in the morning and at bedtime.    ? cholecalciferol (VITAMIN D3) 25 MCG (1000 UNIT) tablet Take 1,000 Units by mouth daily.    ? cyclobenzaprine (FLEXERIL) 10 MG tablet Take 10 mg by mouth 3 (three) times daily as needed.    ? gabapentin (NEURONTIN) 100 MG capsule at bedtime.    ? Iron, Ferrous Sulfate, 325 (65 Fe) MG TABS Take 325 mg by mouth 2 (two) times daily. 30 tablet   ? Multiple Vitamins-Minerals (MULTI-VITAMIN GUMMIES PO) Take by mouth.    ? oxyCODONE (OXY IR/ROXICODONE) 5 MG immediate release tablet Take 1-2 tablets (5-10 mg total) by mouth every 3 (three) hours as needed for breakthrough pain. (Patient taking differently: Take 10 mg by mouth every 3 (three) hours as needed for breakthrough pain.) 80 tablet 0  ? oxymetazoline (AFRIN) 0.05 % nasal spray Place 1 spray into both nostrils 2 (two) times daily.    ? pantoprazole (PROTONIX) 40 MG tablet TAKE 1 TABLET BY MOUTH EVERY DAY (Patient taking differently: 40 mg 2 (two) times daily.) 90 tablet 1  ? traMADol (ULTRAM) 50 MG tablet Take 1-2 tablets (50-100 mg total) by mouth every 6 (six) hours as needed for moderate pain. 60 tablet 0  ? valsartan-hydrochlorothiazide (DIOVAN-HCT) 160-25 MG tablet Take 1 tablet by mouth daily. 90 tablet 1  ? ?Current Facility-Administered Medications on File Prior to Visit  ?Medication Dose Route Frequency Provider Last Rate Last Admin  ? cyanocobalamin ((VITAMIN B-12)) injection 1,000 mcg  1,000 mcg Intramuscular Q30  days Isaac Bliss, Rayford Halsted, MD   1,000 mcg at 11/05/21 1414  ? ? ?LABS/IMAGING: ?No results found for this or any previous visit (from the past 48 hour(s)). ?No results found. ? ?LIPID PANEL: ?   ?Component Value Date/Time  ? CHOL 212 (H) 09/25/2021 1022  ? TRIG 180.0 (H) 09/25/2021 1022  ? HDL 43.20 09/25/2021 1022  ? CHOLHDL 5 09/25/2021 1022  ? VLDL 36.0 09/25/2021 1022  ? LDLCALC 133 (H) 09/25/2021 1022  ? LDLDIRECT 132.8 08/01/2014 1141  ? ? ?WEIGHTS: ?Wt Readings  from Last 3 Encounters:  ?11/25/21 178 lb 12.8 oz (81.1 kg)  ?10/02/21 175 lb (79.4 kg)  ?09/24/21 178 lb 11.2 oz (81.1 kg)  ? ? ?VITALS: ?BP 124/82   Pulse 85   Ht 5' (1.524 m)   Wt 178 lb 12.8 oz (81.1 kg)   SpO2 98%   BMI 34.92 kg/m?  ? ?EXAM: ?General appearance: alert, no distress, and moderately obese ?Neck: no carotid bruit, no JVD, and thyroid not enlarged, symmetric, no tenderness/mass/nodules ?Lungs: clear to auscultation bilaterally ?Heart: regular rate and rhythm, S1, S2 normal, no murmur, click, rub or gallop ?Abdomen: soft, non-tender; bowel sounds normal; no masses,  no organomegaly ?Extremities: extremities normal, atraumatic, no cyanosis or edema ?Pulses: 2+ and symmetric ?Skin: Skin color, texture, turgor normal. No rashes or lesions ?Neurologic: Grossly normal ?Psych: Pleasant ? ?EKG: ?Normal sinus rhythm at 85- personally reviewed ? ?ASSESSMENT: ?Hypertension ?Syncope-likely vasovagal ?Recent acute blood loss anemia/epistaxis ?CKD 3A ? ?PLAN: ?1.   Tammy [name] is seen today for second opinion regarding hypertension management.  Her blood pressure does appear to be better controlled on her current regimen however she does have some degree of chronic kidney disease.  Her last creatinine was 1.06 in January with a GFR 49.  After starting on a diuretic I do think we need to reassess her renal function.  She would also like a repeat CBC because she was recently anemic.  We will order those today.  For now I would continue her  current therapy as it seems to be working.  The syncopal episode was likely a combination of factors including a vasovagal event and significant blood loss related to epistaxis.  She asked as to whether or not

## 2021-11-25 NOTE — Telephone Encounter (Signed)
Med list updated. Note sent to patient in MyChart ?

## 2021-11-25 NOTE — Telephone Encounter (Signed)
Pt c/o medication issue: ? ?1. Name of Medication:  ?etodolac (LODINE) 400 MG tablet ?traZODone (DESYREL) 50 MG tablet ? ?2. How are you currently taking this medication (dosage and times per day)? Not currently taking medication  ? ?3. Are you having a reaction (difficulty breathing--STAT)? No  ? ?4. What is your medication issue? Patient is not on either one of these medications listed on her medication list.   ?

## 2021-11-25 NOTE — Patient Instructions (Signed)
Medication Instructions:  ?Your physician recommends that you continue on your current medications as directed. Please refer to the Current Medication list given to you today. ? ?*If you need a refill on your cardiac medications before your next appointment, please call your pharmacy* ? ? ?Lab Work: ?BMET, CBC today  ? ?If you have labs (blood work) drawn today and your tests are completely normal, you will receive your results only by: ?MyChart Message (if you have MyChart) OR ?A paper copy in the mail ?If you have any lab test that is abnormal or we need to change your treatment, we will call you to review the results. ? ? ?Testing/Procedures: ?NONE ? ? ?Follow-Up: ?At [facility], you and your health needs are our priority.  As part of our continuing mission to provide you with exceptional heart care, we have created designated Provider Care Teams.  These Care Teams include your primary Cardiologist (physician) and Advanced Practice Providers (APPs -  Physician Assistants and Nurse Practitioners) who all work together to provide you with the care you need, when you need it. ? ?We recommend signing up for the patient portal called "MyChart".  Sign up information is provided on this After Visit Summary.  MyChart is used to connect with patients for Virtual Visits (Telemedicine).  Patients are able to view lab/test results, encounter notes, upcoming appointments, etc.  Non-urgent messages can be sent to your provider as well.   ?To learn more about what you can do with MyChart, go to [email or web address].   ? ?Your next appointment:   ?AS NEEDED with Dr. Debara Pickett  ? ?

## 2021-11-26 ENCOUNTER — Other Ambulatory Visit: Payer: Self-pay | Admitting: *Deleted

## 2021-11-26 DIAGNOSIS — I10 Essential (primary) hypertension: Secondary | ICD-10-CM

## 2021-11-26 LAB — BASIC METABOLIC PANEL
BUN/Creatinine Ratio: 16 (ref 12–28)
BUN: 21 mg/dL (ref 8–27)
CO2: 24 mmol/L (ref 20–29)
Calcium: 9.2 mg/dL (ref 8.7–10.3)
Chloride: 98 mmol/L (ref 96–106)
Creatinine, Ser: 1.34 mg/dL — ABNORMAL HIGH (ref 0.57–1.00)
Glucose: 112 mg/dL — ABNORMAL HIGH (ref 70–99)
Potassium: 5 mmol/L (ref 3.5–5.2)
Sodium: 135 mmol/L (ref 134–144)
eGFR: 40 mL/min/{1.73_m2} — ABNORMAL LOW (ref >59)

## 2021-11-26 LAB — CBC
Hematocrit: 39.6 % (ref 34.0–46.6)
Hemoglobin: 12.8 g/dL (ref 11.1–15.9)
MCH: 26.2 pg — ABNORMAL LOW (ref 26.6–33.0)
MCHC: 32.3 g/dL (ref 31.5–35.7)
MCV: 81 fL (ref 79–97)
Platelets: 208 10*3/uL (ref 150–450)
RBC: 4.89 x10E6/uL (ref 3.77–5.28)
RDW: 17.6 % — ABNORMAL HIGH (ref 11.7–15.4)
WBC: 6.5 10*3/uL (ref 3.4–10.8)

## 2021-11-26 MED ORDER — AMLODIPINE BESYLATE-VALSARTAN 5-160 MG PO TABS: 1.0000 | ORAL_TABLET | Freq: Every day | ORAL | 11 refills | Status: DC

## 2021-11-27 ENCOUNTER — Encounter: Payer: Self-pay | Admitting: Internal Medicine

## 2021-11-27 ENCOUNTER — Other Ambulatory Visit: Payer: Self-pay | Admitting: *Deleted

## 2021-11-27 DIAGNOSIS — I10 Essential (primary) hypertension: Secondary | ICD-10-CM

## 2021-12-09 ENCOUNTER — Encounter: Payer: Medicare Other | Admitting: Internal Medicine

## 2021-12-11 ENCOUNTER — Telehealth: Payer: Self-pay | Admitting: Internal Medicine

## 2021-12-11 NOTE — Telephone Encounter (Signed)
Pt on the line states she got a mychart notification for the previous message. Patient requests "that a human call her" [phone] ?

## 2021-12-11 NOTE — Telephone Encounter (Signed)
Patient request Bmet per Dr Debara Pickett. ?

## 2021-12-11 NOTE — Telephone Encounter (Signed)
Pt calling wanting to speak with a nurse regarding the labs for her appointment for tomorrow . Would not elaborate ?

## 2021-12-12 ENCOUNTER — Encounter: Payer: Self-pay | Admitting: Internal Medicine

## 2021-12-12 ENCOUNTER — Ambulatory Visit (INDEPENDENT_AMBULATORY_CARE_PROVIDER_SITE_OTHER): Payer: Medicare Other | Admitting: Internal Medicine

## 2021-12-12 VITALS — BP 140/80 | HR 94 | Temp 97.6°F | Ht 62.0 in | Wt 185.1 lb

## 2021-12-12 DIAGNOSIS — E538 Deficiency of other specified B group vitamins: Secondary | ICD-10-CM | POA: Diagnosis not present

## 2021-12-12 DIAGNOSIS — E785 Hyperlipidemia, unspecified: Secondary | ICD-10-CM | POA: Diagnosis not present

## 2021-12-12 DIAGNOSIS — R7303 Prediabetes: Secondary | ICD-10-CM | POA: Diagnosis not present

## 2021-12-12 DIAGNOSIS — N1831 Chronic kidney disease, stage 3a: Secondary | ICD-10-CM | POA: Diagnosis not present

## 2021-12-12 DIAGNOSIS — D649 Anemia, unspecified: Secondary | ICD-10-CM

## 2021-12-12 DIAGNOSIS — I10 Essential (primary) hypertension: Secondary | ICD-10-CM | POA: Diagnosis not present

## 2021-12-12 LAB — BASIC METABOLIC PANEL
BUN: 16 mg/dL (ref 6–23)
CO2: 28 mEq/L (ref 19–32)
Calcium: 9.1 mg/dL (ref 8.4–10.5)
Chloride: 104 mEq/L (ref 96–112)
Creatinine, Ser: 1.28 mg/dL — ABNORMAL HIGH (ref 0.40–1.20)
GFR: 39.23 mL/min — ABNORMAL LOW (ref >60.00)
Glucose, Bld: 115 mg/dL — ABNORMAL HIGH (ref 70–99)
Potassium: 4.2 mEq/L (ref 3.5–5.1)
Sodium: 137 mEq/L (ref 135–145)

## 2021-12-12 LAB — CBC WITH DIFFERENTIAL/PLATELET
Basophils Absolute: 0 10*3/uL (ref 0.0–0.1)
Basophils Relative: 0.5 % (ref 0.0–3.0)
Eosinophils Absolute: 0.1 10*3/uL (ref 0.0–0.7)
Eosinophils Relative: 2.3 % (ref 0.0–5.0)
HCT: 37.1 % (ref 36.0–46.0)
Hemoglobin: 12 g/dL (ref 12.0–15.0)
Lymphocytes Relative: 24.9 % (ref 12.0–46.0)
Lymphs Abs: 1.4 10*3/uL (ref 0.7–4.0)
MCHC: 32.3 g/dL (ref 30.0–36.0)
MCV: 80.9 fl (ref 78.0–100.0)
Monocytes Absolute: 0.3 10*3/uL (ref 0.1–1.0)
Monocytes Relative: 6.1 % (ref 3.0–12.0)
Neutro Abs: 3.6 10*3/uL (ref 1.4–7.7)
Neutrophils Relative %: 66.2 % (ref 43.0–77.0)
Platelets: 210 10*3/uL (ref 150.0–400.0)
RBC: 4.59 Mil/uL (ref 3.87–5.11)
RDW: 18.9 % — ABNORMAL HIGH (ref 11.5–15.5)
WBC: 5.5 10*3/uL (ref 4.0–10.5)

## 2021-12-12 LAB — LIPID PANEL
Cholesterol: 158 mg/dL (ref 0–200)
HDL: 43.7 mg/dL (ref >39.00)
LDL Cholesterol: 84 mg/dL (ref 0–99)
NonHDL: 114.36
Total CHOL/HDL Ratio: 4
Triglycerides: 153 mg/dL — ABNORMAL HIGH (ref 0.0–149.0)
VLDL: 30.6 mg/dL (ref 0.0–40.0)

## 2021-12-12 MED ORDER — CYANOCOBALAMIN 1000 MCG/ML IJ SOLN
1000.0000 ug | Freq: Once | INTRAMUSCULAR | Status: AC
  Administered 2021-12-12: 1000 ug via INTRAMUSCULAR

## 2021-12-12 NOTE — Progress Notes (Signed)
? ? ? ?Established Patient Office Visit ? ? ? ? ?This visit occurred during the SARS-CoV-2 public health emergency.  Safety protocols were in place, including screening questions prior to the visit, additional usage of staff PPE, and extensive cleaning of exam room while observing appropriate contact time as indicated for disinfecting solutions.  ? ? ?CC/Reason for Visit: 6-week blood pressure follow-up ? ?HPI: Tammy [name] is a 81 y.o. female who is coming in today for the above mentioned reasons. Past Medical History is significant for: Iron deficiency anemia and B12 deficiency, hypertension, GERD, impaired glucose tolerance, morbid obesity, hyperlipidemia, chronic kidney disease stage III.  At her last visit with me in early March she was started on Diovan HCT for blood pressure due to an in office measurement of 180/90.  She then saw cardiology a few weeks later and blood pressure had drastically improved, however she was found to have acute on chronic kidney failure thought secondary to hydrochlorothiazide and she was placed instead on Exforge.  She only started this about 10 days ago.  She is requesting kidney levels be rechecked.  She is also due for B12 injection. ? ? ?Past Medical/Surgical History: ?Past Medical History:  ?Diagnosis Date  ? Anxiety   ? Arthritis   ? knees and back  ? Bruised rib 01/18/2014  ? x-rayed  ? Chicken pox   ? DDD (degenerative disc disease), lumbar   ? Depression   ? mild, never on medication  ? Deviated septum   ? GERD (gastroesophageal reflux disease)   ? Heart murmur   ? told in 2012 benign  ? Hemorrhoids   ? High cholesterol   ? borderline, no meds  ? Hyperglycemia   ? borderline  ? Hypertension   ? high blood pressure readings, no meds  ? Osteoarthritis   ? Pre-diabetes   ? diet- controlled  ? Rib pain on left side 01/20/2014   ? xray done seen by MD 01/20/14 in [facility]   ? Scoliosis   ? Varicose vein   ? ? ?Past Surgical History:  ?Procedure Laterality Date  ? ABDOMINAL  HYSTERECTOMY  1972  ? COLONOSCOPY  05/27/2013  ? DENTAL SURGERY  2007  ? left jaw bone graft, 2 implants on left, 2 implants on right  ? HEMORRHOID BANDING  06/03/13  ? HEMORROIDECTOMY  1975  ? INCISION AND DRAINAGE BREAST ABSCESS Bilateral 1966, 1967  ? MASS EXCISION N/A 04/22/2013  ? Procedure: EXCISION MULTPILE soft tissue masses and abnormal skin lesion left thigh /left chest/ left posterior shoulder/ right flank ;  Surgeon: Harl Bowie, MD;  Location: [facility];  Service: General;  Laterality: N/A;  multiple sites:  left thigh, left shoulder, right flank, right upper arm  ? OVARIAN CYST SURGERY Bilateral 1991  ? OVARIAN CYST SURGERY    ? SEPTOPLASTY Bilateral 12/16/2016  ? Procedure: SEPTOPLASTY;  Surgeon: Leta Baptist, MD;  Location: [facility];  Service: ENT;  Laterality: Bilateral;  ? [place]  ? TOTAL KNEE ARTHROPLASTY Right 07/25/2013  ? Procedure: RIGHT TOTAL KNEE ARTHROPLASTY;  Surgeon: Gearlean Alf, MD;  Location: [facility];  Service: Orthopedics;  Laterality: Right;  Left knee cortisone injection  ? TOTAL KNEE ARTHROPLASTY Left 01/30/2014  ? Procedure: LEFT TOTAL KNEE ARTHROPLASTY;  Surgeon: Gearlean Alf, MD;  Location: [facility];  Service: Orthopedics;  Laterality: Left;  ? TUBAL LIGATION  1970  ? ? ?Social History: ? reports that she has never smoked.  She has never used smokeless tobacco. She reports current alcohol use. She reports that she does not use drugs. ? ?Allergies: ?Allergies  ?Allergen Reactions  ? Hydrocodone   ?  Made her feel very strange, wired, restless ? ?Made her feel very strange, wired, restless  ? Penicillins Rash  ? ? ?Family History:  ?Family History  ?Problem Relation Age of Onset  ? Alcohol abuse Father   ? Heart disease Father   ? Arthritis Mother   ? Stroke Mother   ? Hypertension Mother   ? Diabetes Mother   ? Breast cancer Maternal Grandmother   ? Breast cancer Maternal Aunt   ? Colon cancer Maternal Uncle   ? Esophageal cancer Neg Hx    ? Stomach cancer Neg Hx   ? Rectal cancer Neg Hx   ? ? ? ?Current Outpatient Medications:  ?  amitriptyline (ELAVIL) 25 MG tablet, Take 25 mg by mouth at bedtime as needed. , Disp: , Rfl:  ?  amLODipine-valsartan (EXFORGE) 5-160 MG tablet, Take 1 tablet by mouth daily., Disp: 30 tablet, Rfl: 11 ?  atorvastatin (LIPITOR) 20 MG tablet, Take 1 tablet (20 mg total) by mouth daily., Disp: 90 tablet, Rfl: 1 ?  bisacodyl (DULCOLAX) 5 MG EC tablet, Take 5 mg by mouth in the morning and at bedtime., Disp: , Rfl:  ?  cholecalciferol (VITAMIN D3) 25 MCG (1000 UNIT) tablet, Take 1,000 Units by mouth daily., Disp: , Rfl:  ?  cyclobenzaprine (FLEXERIL) 10 MG tablet, Take 10 mg by mouth 3 (three) times daily as needed., Disp: , Rfl:  ?  gabapentin (NEURONTIN) 100 MG capsule, at bedtime., Disp: , Rfl:  ?  Iron, Ferrous Sulfate, 325 (65 Fe) MG TABS, Take 325 mg by mouth 2 (two) times daily., Disp: 30 tablet, Rfl:  ?  Multiple Vitamins-Minerals (MULTI-VITAMIN GUMMIES PO), Take by mouth., Disp: , Rfl:  ?  oxyCODONE (OXY IR/ROXICODONE) 5 MG immediate release tablet, Take 1-2 tablets (5-10 mg total) by mouth every 3 (three) hours as needed for breakthrough pain. (Patient taking differently: Take 10 mg by mouth every 3 (three) hours as needed for breakthrough pain.), Disp: 80 tablet, Rfl: 0 ?  oxymetazoline (AFRIN) 0.05 % nasal spray, Place 1 spray into both nostrils 2 (two) times daily., Disp: , Rfl:  ?  pantoprazole (PROTONIX) 40 MG tablet, TAKE 1 TABLET BY MOUTH EVERY DAY (Patient taking differently: 40 mg 2 (two) times daily.), Disp: 90 tablet, Rfl: 1 ?  traMADol (ULTRAM) 50 MG tablet, Take 1-2 tablets (50-100 mg total) by mouth every 6 (six) hours as needed for moderate pain., Disp: 60 tablet, Rfl: 0 ?  Valerian 450 MG CAPS, Take by mouth., Disp: , Rfl:  ? ?Current Facility-Administered Medications:  ?  cyanocobalamin ((VITAMIN B-12)) injection 1,000 mcg, 1,000 mcg, Intramuscular, Q30 days, Isaac Bliss, Rayford Halsted, MD, 1,000  mcg at 11/05/21 1414 ? ?Review of Systems:  ?Constitutional: Denies fever, chills, diaphoresis, appetite change and fatigue.  ?HEENT: Denies photophobia, eye pain, redness, hearing loss, ear pain, congestion, sore throat, rhinorrhea, sneezing, mouth sores, trouble swallowing, neck pain, neck stiffness and tinnitus.   ?Respiratory: Denies SOB, DOE, cough, chest tightness,  and wheezing.   ?Cardiovascular: Denies chest pain, palpitations and leg swelling.  ?Gastrointestinal: Denies nausea, vomiting, abdominal pain, diarrhea, constipation, blood in stool and abdominal distention.  ?Genitourinary: Denies dysuria, urgency, frequency, hematuria, flank pain and difficulty urinating.  ?Endocrine: Denies: hot or cold intolerance, sweats, changes in hair or nails, polyuria, polydipsia. ?Musculoskeletal: Denies  myalgias, back pain, joint swelling, arthralgias and gait problem.  ?Skin: Denies pallor, rash and wound.  ?Neurological: Denies dizziness, seizures, syncope, weakness, light-headedness, numbness and headaches.  ?Hematological: Denies adenopathy. Easy bruising, personal or family bleeding history  ?Psychiatric/Behavioral: Denies suicidal ideation, mood changes, confusion, nervousness, sleep disturbance and agitation ? ? ? ?Physical Exam: ?Vitals:  ? 12/12/21 1338  ?BP: 140/80  ?Pulse: 94  ?Temp: 97.6 ?F (36.4 ?C)  ?TempSrc: Oral  ?SpO2: 98%  ?Weight: 185 lb 1.6 oz (84 kg)  ? ? ?Body mass index is 36.15 kg/m?. ? ? ?Constitutional: NAD, calm, comfortable, obese ?Eyes: PERRL, lids and conjunctivae normal ?ENMT: Mucous membranes are moist.  ?Respiratory: clear to auscultation bilaterally, no wheezing, no crackles. Normal respiratory effort. No accessory muscle use.  ?Cardiovascular: Regular rate and rhythm, no murmurs / rubs / gallops. No extremity edema.  ?Neurologic: Grossly intact and nonfocal ?Psychiatric: Normal judgment and insight. Alert and oriented x 3. Normal mood.  ? ? ?Impression and Plan: ? ?Vitamin B12  deficiency ?-IM B12 in office today. ? ?Stage 3a chronic kidney disease ([place]) ?-Recheck renal function today to make sure back in baseline after discontinuing hydrochlorothiazide. ? ?Hyperlipidemia, mild  ?- P

## 2021-12-12 NOTE — Addendum Note (Signed)
Addended by: Westley Hummer B on: 12/12/2021 02:17 PM ? ? Modules accepted: Orders ? ?

## 2021-12-17 ENCOUNTER — Telehealth: Payer: Self-pay | Admitting: Internal Medicine

## 2021-12-17 DIAGNOSIS — K219 Gastro-esophageal reflux disease without esophagitis: Secondary | ICD-10-CM

## 2021-12-17 NOTE — Telephone Encounter (Signed)
Patient called because she is needing someone to contact the pharmacy regarding pantoprazole (PROTONIX) 40 MG tablet because she forgot to notify Dr.Hernandez at the last appointment that she was still taking two a day. Patient states that she was approved to do this by Dr.Hernandez but the pharmacy was not aware. ? ? ? ? ? ? ? ?Please advise  ?

## 2021-12-18 ENCOUNTER — Other Ambulatory Visit: Payer: Medicare Other

## 2021-12-18 DIAGNOSIS — M5416 Radiculopathy, lumbar region: Secondary | ICD-10-CM | POA: Diagnosis not present

## 2021-12-18 DIAGNOSIS — G894 Chronic pain syndrome: Secondary | ICD-10-CM | POA: Diagnosis not present

## 2021-12-18 MED ORDER — PANTOPRAZOLE SODIUM 40 MG PO TBEC: 40.0000 mg | DELAYED_RELEASE_TABLET | Freq: Two times a day (BID) | ORAL | 1 refills | Status: DC

## 2021-12-18 NOTE — Telephone Encounter (Signed)
New rx sent

## 2022-01-01 ENCOUNTER — Telehealth: Payer: Self-pay | Admitting: Internal Medicine

## 2022-01-01 DIAGNOSIS — R600 Localized edema: Secondary | ICD-10-CM

## 2022-01-01 NOTE — Telephone Encounter (Signed)
Pt dropped off Medicare Wellness evaluation form for Dr. Jerilee Hoh to review. She stated she is having issues with her left leg and wanted to see what Dr. Jerilee Hoh will advise based off the form. Form has been placed in file.  ? ?Please advise.  ? ?

## 2022-01-02 NOTE — Telephone Encounter (Signed)
Placed in Dr Hernandez's folder 

## 2022-01-06 NOTE — Addendum Note (Signed)
Addended by: Westley Hummer B on: 01/06/2022 05:09 PM ? ? Modules accepted: Orders ? ?

## 2022-01-14 ENCOUNTER — Ambulatory Visit (INDEPENDENT_AMBULATORY_CARE_PROVIDER_SITE_OTHER): Payer: Medicare Other

## 2022-01-14 DIAGNOSIS — E538 Deficiency of other specified B group vitamins: Secondary | ICD-10-CM | POA: Diagnosis not present

## 2022-01-14 MED ORDER — CYANOCOBALAMIN 1000 MCG/ML IJ SOLN
1000.0000 ug | Freq: Once | INTRAMUSCULAR | Status: AC
  Administered 2022-01-14: 1000 ug via INTRAMUSCULAR

## 2022-01-14 NOTE — Progress Notes (Signed)
Pt here for monthly B12 injection per Dr Jerilee Hoh  B12 1067mg given IM, and pt tolerated injection well.  Next B12 injection scheduled for 02/05/22.

## 2022-01-22 ENCOUNTER — Other Ambulatory Visit: Payer: Self-pay | Admitting: Internal Medicine

## 2022-01-22 DIAGNOSIS — E785 Hyperlipidemia, unspecified: Secondary | ICD-10-CM

## 2022-01-27 ENCOUNTER — Other Ambulatory Visit: Payer: Self-pay | Admitting: Internal Medicine

## 2022-01-27 DIAGNOSIS — I739 Peripheral vascular disease, unspecified: Secondary | ICD-10-CM

## 2022-01-29 ENCOUNTER — Encounter: Payer: Self-pay | Admitting: Internal Medicine

## 2022-01-29 ENCOUNTER — Ambulatory Visit (HOSPITAL_COMMUNITY)
Admission: RE | Admit: 2022-01-29 | Discharge: 2022-01-29 | Disposition: A | Discharge status: Home / Self Care | Payer: Medicare Other | Source: Ambulatory Visit | Attending: Internal Medicine | Admitting: Internal Medicine

## 2022-01-29 DIAGNOSIS — I739 Peripheral vascular disease, unspecified: Secondary | ICD-10-CM | POA: Insufficient documentation

## 2022-01-30 ENCOUNTER — Other Ambulatory Visit: Payer: Self-pay

## 2022-01-30 DIAGNOSIS — I739 Peripheral vascular disease, unspecified: Secondary | ICD-10-CM

## 2022-02-05 ENCOUNTER — Ambulatory Visit (INDEPENDENT_AMBULATORY_CARE_PROVIDER_SITE_OTHER): Payer: Medicare Other | Admitting: *Deleted

## 2022-02-05 DIAGNOSIS — E538 Deficiency of other specified B group vitamins: Secondary | ICD-10-CM

## 2022-02-05 MED ORDER — CYANOCOBALAMIN 1000 MCG/ML IJ SOLN
1000.0000 ug | Freq: Once | INTRAMUSCULAR | Status: AC
  Administered 2022-02-05: 1000 ug via INTRAMUSCULAR

## 2022-02-05 NOTE — Progress Notes (Signed)
Per orders of Dr. Hernandez, injection of Cyanocobalamin 1000mcg given by [name]. Patient tolerated injection well. 

## 2022-02-08 NOTE — Progress Notes (Unsigned)
[facility] [place]  ASSESSMENT / PLAN: Tammy [name] is a 81 y.o. female with atherosclerosis of native arteries of left lower extremity causing no symptoms.  Patient counseled patients with asymptomatic peripheral arterial disease or claudication have a 1-2% risk of developing chronic limb threatening ischemia, but a 15-30% risk of mortality in the next 5 years. Intervention should only be considered for medically optimized patients with disabling symptoms.   Recommend the following which can slow the progression of atherosclerosis and reduce the risk of major adverse cardiac / limb events:  Complete cessation from all tobacco products. Blood glucose control with goal A1c < 7%. Blood pressure control with goal blood pressure < 140/90 mmHg. Lipid reduction therapy with goal LDL-C <100 mg/dL (<70 if symptomatic from PAD).  Aspirin '81mg'$  PO QD.  Atorvastatin 40-'80mg'$  PO QD (or other "high intensity" statin therapy).  I reassured the patient about her ABI findings. Every effort should be made to reduce her risk of cardiac and cerebrovascular problem (see above).   The patient reports a recent syncopal episode with tingling in her right arm. I offered her carotid duplex. I will call her with the results.   CHIEF COMPLAINT: Abnormal screening test  HISTORY OF PRESENT ILLNESS: Tammy [name] is a 81 y.o. female referred to clinic for evaluation of abnormal screening test.  The patient had a home health visit which performed a assessment of her lower extremity circulation.  This was read as abnormal, prompting referral to her primary care physician.  A noninvasive study was performed and showed left lower extremity arterial disease.  The patient is entirely asymptomatic from arterial disease.  She denies any symptoms typical of intermittent claudication, ischemic rest pain.  She has no ischemic ulcers about the feet.  She is ambulatory and has no limits on her  activity.  The patient does endorse a recent syncopal episode where she awoke after a brief loss of consciousness with tingling in her right upper extremity.  She did not pursue any further work-up for this, as she got better rather quickly.  VASCULAR SURGICAL HISTORY: none  VASCULAR RISK FACTORS: Negative history of stroke / transient ischemic attack. Negative history of coronary artery disease.  Negative history of diabetes mellitus. Patient is "pre-diabetic." Negative history of smoking.  Positive history of hypertension.  Positive history of chronic kidney disease.  Last GFR 39. CKD stage 3 - GFR 30-59. Negative history of chronic obstructive pulmonary disease.  FUNCTIONAL STATUS: ECOG performance status: (1) Restricted in physically strenuous activity, ambulatory and able to do work of light nature Ambulatory status: Ambulatory within the community without limits  Past Medical History:  Diagnosis Date   Anxiety    Arthritis    knees and back   Bruised rib 01/18/2014   x-rayed   Chicken pox    DDD (degenerative disc disease), lumbar    Depression    mild, never on medication   Deviated septum    GERD (gastroesophageal reflux disease)    Heart murmur    told in 2012 benign   Hemorrhoids    High cholesterol    borderline, no meds   Hyperglycemia    borderline   Hypertension    high blood pressure readings, no meds   Osteoarthritis    Pre-diabetes    diet- controlled   Rib pain on left side 01/20/2014    xray done seen by MD 01/20/14 in [facility]    Scoliosis    Varicose vein  Past Surgical History:  Procedure Laterality Date   ABDOMINAL HYSTERECTOMY  1972   COLONOSCOPY  05/27/2013   DENTAL SURGERY  2007   left jaw bone graft, 2 implants on left, 2 implants on right   HEMORRHOID BANDING  06/03/13   [place] DRAINAGE BREAST ABSCESS Bilateral 1966, 1967   MASS EXCISION N/A 04/22/2013   Procedure: EXCISION MULTPILE soft tissue masses and  abnormal skin lesion left thigh /left chest/ left posterior shoulder/ right flank ;  Surgeon: Harl Bowie, MD;  Location: [facility];  Service: General;  Laterality: N/A;  multiple sites:  left thigh, left shoulder, right flank, right upper arm   OVARIAN CYST SURGERY Bilateral 1991   OVARIAN CYST SURGERY     SEPTOPLASTY Bilateral 12/16/2016   Procedure: SEPTOPLASTY;  Surgeon: Leta Baptist, MD;  Location: [place];  Service: ENT;  Laterality: Bilateral;   [place] Right 07/25/2013   Procedure: RIGHT TOTAL KNEE ARTHROPLASTY;  Surgeon: Gearlean Alf, MD;  Location[facility];  Service: Orthopedics;  Laterality: Right;  Left knee cortisone injection   TOTAL KNEE ARTHROPLASTY Left 01/30/2014   Procedure: LEFT TOTAL KNEE ARTHROPLASTY;  Surgeon: Gearlean Alf, MD;  [facility];  Service: Orthopedics;  Laterality: Left;   TUBAL LIGATION  1970    Family History  Problem Relation Age of Onset   Alcohol abuse Father    Heart disease Father    Arthritis Mother    Stroke Mother    Hypertension Mother    Diabetes Mother    Breast cancer Maternal Grandmother    Breast cancer Maternal Aunt    Colon cancer Maternal Uncle    Esophageal cancer Neg Hx    Stomach cancer Neg Hx    Rectal cancer Neg Hx     Social History   Socioeconomic History   Marital status: Divorced    Spouse name: Not on file   Number of children: Not on file   Years of education: Not on file   Highest education level: Not on file  Occupational History   Not on file  Tobacco Use   Smoking status: Never   Smokeless tobacco: Never  Substance and Sexual Activity   Alcohol use: Yes    Comment: socially   Drug use: No   Sexual activity: Not on file  Other Topics Concern   Not on file  Social History Narrative   Work or School: retiredHome Situation: lives aloneSpiritual Beliefs: spiritual - but not religiousLifestyle: rolls side to side, true back,  mule kicks, walks 3-4 days per week for 1 mile; diet is ok - not great   Right handed   No caffeine   Social Determinants of Radio broadcast assistant Strain: Not on file  Food Insecurity: Not on file  Transportation Needs: Not on file  Physical Activity: Not on file  Stress: Not on file  Social Connections: Not on file  Intimate Partner Violence: Not on file    Allergies  Allergen Reactions   Hydrocodone     Made her feel very strange, wired, restless  Made her feel very strange, wired, restless   Penicillins Rash    Current Outpatient Medications  Medication Sig Dispense Refill   amLODipine-valsartan (EXFORGE) 5-160 MG tablet Take 1 tablet by mouth daily. 30 tablet 11   atorvastatin (LIPITOR) 20 MG tablet Take 1 tablet (20 mg total) by mouth daily. [place]  tablet 1   bisacodyl (DULCOLAX) 5 MG EC tablet Take 5 mg by mouth in the morning and at bedtime.     cholecalciferol (VITAMIN D3) 25 MCG (1000 UNIT) tablet Take 1,000 Units by mouth daily.     cyclobenzaprine (FLEXERIL) 10 MG tablet Take 10 mg by mouth 3 (three) times daily as needed.     gabapentin (NEURONTIN) 100 MG capsule at bedtime.     Iron, Ferrous Sulfate, 325 (65 Fe) MG TABS Take 325 mg by mouth 2 (two) times daily. 30 tablet    Multiple Vitamins-Minerals (MULTI-VITAMIN GUMMIES PO) Take by mouth.     oxyCODONE (OXY IR/ROXICODONE) 5 MG immediate release tablet Take 1-2 tablets (5-10 mg total) by mouth every 3 (three) hours as needed for breakthrough pain. (Patient taking differently: Take 10 mg by mouth every 3 (three) hours as needed for breakthrough pain.) 80 tablet 0   oxymetazoline (AFRIN) 0.05 % nasal spray Place 1 spray into both nostrils 2 (two) times daily.     pantoprazole (PROTONIX) 40 MG tablet Take 1 tablet (40 mg total) by mouth 2 (two) times daily. 180 tablet 1   traMADol (ULTRAM) 50 MG tablet Take 1-2 tablets (50-100 mg total) by mouth every 6 (six) hours as needed for moderate pain. 60 tablet 0    Valerian 450 MG CAPS Take by mouth.     amitriptyline (ELAVIL) 25 MG tablet Take 25 mg by mouth at bedtime as needed.  (Patient not taking: Reported on 02/10/2022)     Current Facility-Administered Medications  Medication Dose Route Frequency Provider Last Rate Last Admin   cyanocobalamin ((VITAMIN B-12)) injection 1,000 mcg  1,000 mcg Intramuscular Q30 days Isaac Bliss, Rayford Halsted, MD   1,000 mcg at 11/05/21 1414    PHYSICAL EXAM Vitals:   02/10/22 0856 02/10/22 0900  BP: 132/74 125/70  Pulse: 81   Resp: 14   Temp: 98.1 F (36.7 C)   TempSrc: Temporal   SpO2: 93%   Weight: 185 lb 6.4 oz (84.1 kg)   Height: '5\' 3"'$  (1.6 m)     Constitutional: Elderly woman in no acute distress Cardiac: regular rate and rhythm.  Respiratory: unlabored. Abdominal:  soft, non-tender, non-distended.  Peripheral vascular: 2+ right dorsalis pedis pulse.  Absent left dorsalis pedis pulse Extremity: no edema. no cyanosis. no pallor.  Skin: no gangrene. no ulceration.  Scars consistent with bilateral total knee arthroplasty Lymphatic: no Stemmer's sign. no palpable lymphadenopathy.  PERTINENT LABORATORY AND RADIOLOGIC DATA  Most recent CBC    Latest Ref Rng & Units 12/12/2021    1:57 PM 11/25/2021    2:20 PM 09/10/2021    2:11 PM  CBC  WBC 4.0 - 10.5 K/uL 5.5  6.5  5.2   Hemoglobin 12.0 - 15.0 g/dL 12.0  12.8  8.9 Repeated and verified X2.   Hematocrit 36.0 - 46.0 % 37.1  39.6  28.1   Platelets 150.0 - 400.0 K/uL 210.0  208  215.0      Most recent CMP    Latest Ref Rng & Units 12/12/2021    1:57 PM 11/25/2021    2:20 PM 09/10/2021    2:11 PM  CMP  Glucose 70 - 99 mg/dL 115  112  111   BUN 6 - 23 mg/dL '16  21  17   '$ Creatinine 0.40 - 1.20 mg/dL 1.28  1.34  1.06   Sodium 135 - 145 mEq/L 137  135  137   Potassium 3.5 - 5.1 mEq/L 4.2  5.0  4.3   Chloride 96 - 112 mEq/L 104  98  104   CO2 19 - 32 mEq/L '28  24  25   '$ Calcium 8.4 - 10.5 mg/dL 9.1  9.2  9.3   Total Protein 6.0 - 8.3 g/dL   7.4    Total Bilirubin 0.2 - 1.2 mg/dL   0.4   Alkaline Phos 39 - 117 U/L   64   AST 0 - 37 U/L   14   ALT 0 - 35 U/L   9     Renal function CrCl cannot be calculated (Patient's most recent lab result is older than the maximum 21 days allowed.).  Hgb A1c MFr Bld (%)  Date Value  09/25/2021 5.8    LDL Cholesterol  Date Value Ref Range Status  12/12/2021 84 0 - 99 mg/dL Final   Direct LDL  Date Value Ref Range Status  08/01/2014 132.8 mg/dL Final    Comment:    Optimal:  <100 mg/dLNear or Above Optimal:  100-129 mg/dLBorderline High:  130-159 mg/dLHigh:  160-189 mg/dLVery High:  >190 mg/dL    +-------+-----------+-----------+------------+------------+  ABI/TBIToday's ABIToday's TBIPrevious ABIPrevious TBI  +-------+-----------+-----------+------------+------------+  Right  1.04       0.84                                 +-------+-----------+-----------+------------+------------+  Left   0.66       0.49                                 +-------+-----------+-----------+------------+------------+   Yevonne Aline. Stanford Breed, MD [facility] Phone Number: [phone] 02/10/2022 12:47 PM  Total time spent on preparing this encounter including chart review, data review, collecting history, examining the patient, coordinating care for this new patient, 60 minutes.  Portions of this report may have been transcribed using voice recognition software.  Every effort has been made to ensure accuracy; however, inadvertent computerized transcription errors may still be present.

## 2022-02-10 ENCOUNTER — Ambulatory Visit (HOSPITAL_COMMUNITY)
Admission: RE | Admit: 2022-02-10 | Discharge: 2022-02-10 | Disposition: A | Discharge status: Home / Self Care | Payer: Medicare Other | Source: Ambulatory Visit | Attending: Vascular Surgery | Admitting: Vascular Surgery

## 2022-02-10 ENCOUNTER — Encounter: Payer: Self-pay | Admitting: Vascular Surgery

## 2022-02-10 ENCOUNTER — Ambulatory Visit: Payer: Medicare Other | Admitting: Vascular Surgery

## 2022-02-10 ENCOUNTER — Other Ambulatory Visit: Payer: Self-pay | Admitting: *Deleted

## 2022-02-10 VITALS — BP 125/70 | HR 81 | Temp 98.1°F | Resp 14 | Ht 63.0 in | Wt 185.4 lb

## 2022-02-10 DIAGNOSIS — R0989 Other specified symptoms and signs involving the circulatory and respiratory systems: Secondary | ICD-10-CM

## 2022-02-10 DIAGNOSIS — I739 Peripheral vascular disease, unspecified: Secondary | ICD-10-CM | POA: Diagnosis not present

## 2022-03-12 ENCOUNTER — Telehealth: Payer: Self-pay | Admitting: Internal Medicine

## 2022-03-12 NOTE — Telephone Encounter (Signed)
Patient called wanting to add a baby aspirin to her medication regime.

## 2022-03-12 NOTE — Telephone Encounter (Signed)
Returned call to patient who states that she was calling in regard to starting an '81mg'$  once daily. Patient states that her PCP recommended it due to her recent lower extremity ultrasounds and carotid ultrasounds. Patient states she would like to know what Dr. Debara Pickett thinks about this. Advised her that I would forward message to Dr. Debara Pickett for him to review and advise. Patient verbalized understanding.

## 2022-03-13 ENCOUNTER — Ambulatory Visit (INDEPENDENT_AMBULATORY_CARE_PROVIDER_SITE_OTHER): Payer: Medicare Other | Admitting: Internal Medicine

## 2022-03-13 ENCOUNTER — Encounter: Payer: Self-pay | Admitting: Internal Medicine

## 2022-03-13 ENCOUNTER — Telehealth: Payer: Self-pay | Admitting: Internal Medicine

## 2022-03-13 ENCOUNTER — Other Ambulatory Visit: Payer: Self-pay | Admitting: *Deleted

## 2022-03-13 VITALS — BP 142/73 | HR 85 | Temp 97.9°F | Wt 189.7 lb

## 2022-03-13 DIAGNOSIS — R0982 Postnasal drip: Secondary | ICD-10-CM

## 2022-03-13 DIAGNOSIS — I739 Peripheral vascular disease, unspecified: Secondary | ICD-10-CM

## 2022-03-13 DIAGNOSIS — I10 Essential (primary) hypertension: Secondary | ICD-10-CM | POA: Diagnosis not present

## 2022-03-13 DIAGNOSIS — N1831 Chronic kidney disease, stage 3a: Secondary | ICD-10-CM | POA: Diagnosis not present

## 2022-03-13 DIAGNOSIS — R7303 Prediabetes: Secondary | ICD-10-CM

## 2022-03-13 DIAGNOSIS — E538 Deficiency of other specified B group vitamins: Secondary | ICD-10-CM | POA: Diagnosis not present

## 2022-03-13 DIAGNOSIS — D509 Iron deficiency anemia, unspecified: Secondary | ICD-10-CM

## 2022-03-13 DIAGNOSIS — E785 Hyperlipidemia, unspecified: Secondary | ICD-10-CM | POA: Diagnosis not present

## 2022-03-13 LAB — CBC WITH DIFFERENTIAL/PLATELET
Basophils Absolute: 0 10*3/uL (ref 0.0–0.1)
Basophils Relative: 0.3 % (ref 0.0–3.0)
Eosinophils Absolute: 0.1 10*3/uL (ref 0.0–0.7)
Eosinophils Relative: 2.1 % (ref 0.0–5.0)
HCT: 37.5 % (ref 36.0–46.0)
Hemoglobin: 12.2 g/dL (ref 12.0–15.0)
Lymphocytes Relative: 16.1 % (ref 12.0–46.0)
Lymphs Abs: 1.1 10*3/uL (ref 0.7–4.0)
MCHC: 32.4 g/dL (ref 30.0–36.0)
MCV: 84.1 fl (ref 78.0–100.0)
Monocytes Absolute: 0.3 10*3/uL (ref 0.1–1.0)
Monocytes Relative: 4.7 % (ref 3.0–12.0)
Neutro Abs: 5.1 10*3/uL (ref 1.4–7.7)
Neutrophils Relative %: 76.8 % (ref 43.0–77.0)
Platelets: 200 10*3/uL (ref 150.0–400.0)
RBC: 4.46 Mil/uL (ref 3.87–5.11)
RDW: 16 % — ABNORMAL HIGH (ref 11.5–15.5)
WBC: 6.6 10*3/uL (ref 4.0–10.5)

## 2022-03-13 LAB — LIPID PANEL
Cholesterol: 148 mg/dL (ref 0–200)
HDL: 39.7 mg/dL (ref >39.00)
LDL Cholesterol: 79 mg/dL (ref 0–99)
NonHDL: 107.92
Total CHOL/HDL Ratio: 4
Triglycerides: 146 mg/dL (ref 0.0–149.0)
VLDL: 29.2 mg/dL (ref 0.0–40.0)

## 2022-03-13 LAB — POCT GLYCOSYLATED HEMOGLOBIN (HGB A1C): Hemoglobin A1C: 5.7 % — AB (ref 4.0–5.6)

## 2022-03-13 LAB — IBC + FERRITIN
Ferritin: 48.4 ng/mL (ref 10.0–291.0)
Iron: 62 ug/dL (ref 42–145)
Saturation Ratios: 19.2 % — ABNORMAL LOW (ref 20.0–50.0)
TIBC: 323.4 ug/dL (ref 250.0–450.0)
Transferrin: 231 mg/dL (ref 212.0–360.0)

## 2022-03-13 MED ORDER — ASPIRIN 81 MG PO TBEC: 81.0000 mg | DELAYED_RELEASE_TABLET | Freq: Every day | ORAL | 12 refills | Status: AC

## 2022-03-13 MED ORDER — BD SAFETYGLIDE SYRINGE/NEEDLE 25G X 1" 3 ML MISC
11 refills | Status: DC
Start: 2022-03-13 — End: 2023-01-06

## 2022-03-13 MED ORDER — CYANOCOBALAMIN 1000 MCG/ML IJ SOLN
1000.0000 ug | Freq: Once | INTRAMUSCULAR | Status: AC
  Administered 2022-03-13: 1000 ug via INTRAMUSCULAR

## 2022-03-13 MED ORDER — ATORVASTATIN CALCIUM 40 MG PO TABS: 20.0000 mg | ORAL_TABLET | Freq: Every day | ORAL | 1 refills | Status: DC

## 2022-03-13 MED ORDER — CYANOCOBALAMIN 1000 MCG/ML IJ SOLN
INTRAMUSCULAR | 11 refills | Status: DC
Start: 2022-03-13 — End: 2023-01-06

## 2022-03-13 NOTE — Patient Instructions (Signed)
-  Nice seeing you today!!  -Lab work today; will notify you once results are available.  -Can try mucinex twice daily and zyrtec daily for allergies and drainage.  -Start aspirin 81 mg daily.  -Increase lipitor to 40 mg daily.  -Schedule follow up in 3 months with home BP measurements.

## 2022-03-13 NOTE — Telephone Encounter (Signed)
noted 

## 2022-03-13 NOTE — Telephone Encounter (Signed)
Pt requesting a copy of the labs that were done on today. Requests a call when they are ready to pick up

## 2022-03-13 NOTE — Telephone Encounter (Signed)
Attempted to call patient, left message for patient to call back to office.   Pixie Casino, MD  You 2 hours ago (1:49 PM)    I agree -the vascular surgeon recommended aspirin 81 mg daily as well.   Dr Lemmie Evens

## 2022-03-13 NOTE — Telephone Encounter (Signed)
Pt requested for a paper copy of her lab results when they come in.   Please advise.

## 2022-03-13 NOTE — Progress Notes (Signed)
Established Patient Office Visit     CC/Reason for Visit: Follow-up chronic conditions, discuss acute concerns  HPI: Tammy [name] is a 81 y.o. female who is coming in today for the above mentioned reasons. Past Medical History is significant for: Iron deficiency anemia and B12 deficiency, hypertension, GERD, impaired glucose tolerance, morbid obesity, hyperlipidemia, chronic kidney disease stage III.  In office blood pressure is a little elevated today, however at home systolic still ranged between 108 and 135 on average.  She was seen by vascular surgery for PAD after ABIs.  She was only advised to risk factor modification and to start aspirin 80 g daily.  She is requesting supplies to do her B12 injections at home, she is requesting B12 today.  At last check her LDL was 84 she is currently on atorvastatin 20 mg.  She has been experiencing some postnasal drip.   Past Medical/Surgical History: Past Medical History:  Diagnosis Date   Anxiety    Arthritis    knees and back   Bruised rib 01/18/2014   x-rayed   Chicken pox    DDD (degenerative disc disease), lumbar    Depression    mild, never on medication   Deviated septum    GERD (gastroesophageal reflux disease)    Heart murmur    told in 2012 benign   Hemorrhoids    High cholesterol    borderline, no meds   Hyperglycemia    borderline   Hypertension    high blood pressure readings, no meds   Osteoarthritis    Pre-diabetes    diet- controlled   Rib pain on left side 01/20/2014    xray done seen by MD 01/20/14 in [facility]    Scoliosis    Varicose vein     Past Surgical History:  Procedure Laterality Date   ABDOMINAL HYSTERECTOMY  1972   COLONOSCOPY  05/27/2013   DENTAL SURGERY  2007   left jaw bone graft, 2 implants on left, 2 implants on right   HEMORRHOID BANDING  06/03/13   [place] BREAST ABSCESS Bilateral 1966, 1967   MASS EXCISION N/A 04/22/2013   Procedure:  EXCISION MULTPILE soft tissue masses and abnormal skin lesion left thigh /left chest/ left posterior shoulder/ right flank ;  Surgeon: Harl Bowie, MD;  Location: [facility];  Service: General;  Laterality: N/A;  multiple sites:  left thigh, left shoulder, right flank, right upper arm   OVARIAN CYST SURGERY Bilateral 1991   OVARIAN CYST SURGERY     SEPTOPLASTY Bilateral 12/16/2016   Procedure: SEPTOPLASTY;  Surgeon: Leta Baptist, MD;  Location: [facility];  Service: ENT;  Laterality: Bilateral;   [place] Right 07/25/2013   Procedure: RIGHT TOTAL KNEE ARTHROPLASTY;  Surgeon: Gearlean Alf, MD;  Location: [facility];  Service: Orthopedics;  Laterality: Right;  Left knee cortisone injection   TOTAL KNEE ARTHROPLASTY Left 01/30/2014   Procedure: LEFT TOTAL KNEE ARTHROPLASTY;  Surgeon: Gearlean Alf, MD;  Location: [facility];  Service: Orthopedics;  Laterality: Left;   TUBAL LIGATION  1970    Social History:  reports that she has never smoked. She has never used smokeless tobacco. She reports current alcohol use. She reports that she does not use drugs.  Allergies: Allergies  Allergen Reactions   Hydrocodone     Made her feel very strange, wired, restless  Made her feel very  strange, wired, restless   Penicillins Rash    Family History:  Family History  Problem Relation Age of Onset   Alcohol abuse Father    Heart disease Father    Arthritis Mother    Stroke Mother    Hypertension Mother    Diabetes Mother    Breast cancer Maternal Grandmother    Breast cancer Maternal Aunt    Colon cancer Maternal Uncle    Esophageal cancer Neg Hx    Stomach cancer Neg Hx    Rectal cancer Neg Hx      Current Outpatient Medications:    amLODipine-valsartan (EXFORGE) 5-160 MG tablet, Take 1 tablet by mouth daily. (Patient taking differently: Take 1 tablet by mouth daily. In the morning), Disp: 30 tablet, Rfl: 11   bisacodyl  (DULCOLAX) 5 MG EC tablet, Take 5 mg by mouth in the morning and at bedtime., Disp: , Rfl:    cholecalciferol (VITAMIN D3) 25 MCG (1000 UNIT) tablet, Take 1,000 Units by mouth daily., Disp: , Rfl:    cyclobenzaprine (FLEXERIL) 10 MG tablet, Take 10 mg by mouth 3 (three) times daily as needed., Disp: , Rfl:    gabapentin (NEURONTIN) 100 MG capsule, at bedtime., Disp: , Rfl:    Iron, Ferrous Sulfate, 325 (65 Fe) MG TABS, Take 325 mg by mouth 2 (two) times daily. (Patient taking differently: Take 325 mg by mouth daily.), Disp: 30 tablet, Rfl:    Multiple Vitamins-Minerals (MULTI-VITAMIN GUMMIES PO), Take by mouth., Disp: , Rfl:    oxyCODONE (OXY IR/ROXICODONE) 5 MG immediate release tablet, Take 1-2 tablets (5-10 mg total) by mouth every 3 (three) hours as needed for breakthrough pain. (Patient taking differently: Take 10 mg by mouth every 3 (three) hours as needed for breakthrough pain.), Disp: 80 tablet, Rfl: 0   oxymetazoline (AFRIN) 0.05 % nasal spray, Place 1 spray into both nostrils 2 (two) times daily., Disp: , Rfl:    pantoprazole (PROTONIX) 40 MG tablet, Take 1 tablet (40 mg total) by mouth 2 (two) times daily., Disp: 180 tablet, Rfl: 1   traMADol (ULTRAM) 50 MG tablet, Take 1-2 tablets (50-100 mg total) by mouth every 6 (six) hours as needed for moderate pain. (Patient taking differently: Take 50-100 mg by mouth at bedtime as needed for moderate pain.), Disp: 60 tablet, Rfl: 0   Valerian 450 MG CAPS, Take by mouth., Disp: , Rfl:    aspirin EC 81 MG tablet, Take 1 tablet (81 mg total) by mouth daily. Swallow whole., Disp: 30 tablet, Rfl: 12   atorvastatin (LIPITOR) 40 MG tablet, Take 0.5 tablets (20 mg total) by mouth daily., Disp: 90 tablet, Rfl: 1  Current Facility-Administered Medications:    cyanocobalamin ((VITAMIN B-12)) injection 1,000 mcg, 1,000 mcg, Intramuscular, Q30 days, Isaac Bliss, Rayford Halsted, MD, 1,000 mcg at 11/05/21 1414  Review of Systems:  Constitutional: Denies fever,  chills, diaphoresis, appetite change and fatigue.  HEENT: Denies photophobia, eye pain, redness, hearing loss, mouth sores, trouble swallowing, neck pain, neck stiffness and tinnitus.   Respiratory: Denies SOB, DOE,  chest tightness,  and wheezing.   Cardiovascular: Denies chest pain, palpitations and leg swelling.  Gastrointestinal: Denies nausea, vomiting, abdominal pain, diarrhea, constipation, blood in stool and abdominal distention.  Genitourinary: Denies dysuria, urgency, frequency, hematuria, flank pain and difficulty urinating.  Endocrine: Denies: hot or cold intolerance, sweats, changes in hair or nails, polyuria, polydipsia. Musculoskeletal: Denies myalgias, back pain, joint swelling, arthralgias and gait problem.  Skin: Denies pallor, rash and wound.  Neurological: Denies dizziness, seizures, syncope, weakness, light-headedness, numbness and headaches.  Hematological: Denies adenopathy. Easy bruising, personal or family bleeding history  Psychiatric/Behavioral: Denies suicidal ideation, mood changes, confusion, nervousness, sleep disturbance and agitation    Physical Exam: Vitals:   03/13/22 1301 03/13/22 1304  BP: 140/84 (!) 142/73  Pulse: 85   Temp: 97.9 F (36.6 C)   TempSrc: Oral   SpO2: 98%   Weight: 189 lb 11.2 oz (86 kg)     Body mass index is 33.6 kg/m.   Constitutional: NAD, calm, comfortable Eyes: PERRL, lids and conjunctivae normal ENMT: Mucous membranes are moist.  Respiratory: clear to auscultation bilaterally, no wheezing, no crackles. Normal respiratory effort. No accessory muscle use.  Cardiovascular: Regular rate and rhythm, no murmurs / rubs / gallops. No extremity edema.  Psychiatric: Normal judgment and insight. Alert and oriented x 3. Normal mood.    Impression and Plan:  Vitamin B12 deficiency - Plan: cyanocobalamin ((VITAMIN B-12)) injection 1,000 mcg  Prediabetes - Plan: POCT glycosylated hemoglobin (Hb A1C)  PAD (peripheral artery  disease) (HCC) - Plan: aspirin EC 81 MG tablet  Stage 3a chronic kidney disease (HCC)  Hyperlipidemia, mild - Plan: Lipid panel, atorvastatin (LIPITOR) 40 MG tablet  Essential hypertension, benign  Iron deficiency anemia, unspecified iron deficiency anemia type - Plan: CBC with Differential/Platelet, IBC + Ferritin  PND (post-nasal drip)  -She received B12 injection in office today, in addition I will send supplies for her to continue to do these at home. -A1c looks good at 5.7, she has impaired glucose tolerance. -In light of her PAD I would like to push her LDL below 70, to that effect I will increase her atorvastatin from 20 to 40 mg, repeat lipids in 3 months.  She will also start a baby aspirin that was recommended by vascular. -Blood pressure is elevated in office, however home measurements are better.  She will do ambulatory blood pressure measurements and return in 3 months for follow-up. -Check labs to follow her iron deficiency anemia, chronic kidney disease. -For her PND I have advised twice daily Mucinex and daily antihistamine.  Time spent:33 minutes reviewing chart, interviewing and examining patient and formulating plan of care.   Patient Instructions  -Nice seeing you today!!  -Lab work today; will notify you once results are available.  -Can try mucinex twice daily and zyrtec daily for allergies and drainage.  -Start aspirin 81 mg daily.  -Increase lipitor to 40 mg daily.  -Schedule follow up in 3 months with home BP measurements.    Lelon Frohlich, MD [facility]

## 2022-03-14 ENCOUNTER — Encounter: Payer: Self-pay | Admitting: *Deleted

## 2022-03-18 ENCOUNTER — Other Ambulatory Visit: Payer: Self-pay | Admitting: Internal Medicine

## 2022-03-18 DIAGNOSIS — Z1231 Encounter for screening mammogram for malignant neoplasm of breast: Secondary | ICD-10-CM

## 2022-03-18 NOTE — Telephone Encounter (Signed)
Spoke with patient and made her aware of Dr. Lysbeth Penner recommendations.   Patient states that she also was told by her PCP to increase her Lipitor to '40mg'$  once daily and wants to let Dr. Debara Pickett know.   Advised I would forward to him to make him aware.

## 2022-03-18 NOTE — Telephone Encounter (Signed)
MyChart message with MD advice sent to patient

## 2022-03-25 ENCOUNTER — Ambulatory Visit
Admission: RE | Admit: 2022-03-25 | Discharge: 2022-03-25 | Disposition: A | Discharge status: Home / Self Care | Payer: Medicare Other | Source: Ambulatory Visit | Attending: Internal Medicine | Admitting: Internal Medicine

## 2022-03-25 DIAGNOSIS — M85831 Other specified disorders of bone density and structure, right forearm: Secondary | ICD-10-CM | POA: Diagnosis not present

## 2022-03-25 DIAGNOSIS — M810 Age-related osteoporosis without current pathological fracture: Secondary | ICD-10-CM | POA: Diagnosis not present

## 2022-03-25 DIAGNOSIS — Z780 Asymptomatic menopausal state: Secondary | ICD-10-CM | POA: Diagnosis not present

## 2022-03-25 DIAGNOSIS — Z13820 Encounter for screening for osteoporosis: Secondary | ICD-10-CM

## 2022-03-25 DIAGNOSIS — Z1231 Encounter for screening mammogram for malignant neoplasm of breast: Secondary | ICD-10-CM

## 2022-03-28 ENCOUNTER — Other Ambulatory Visit: Payer: Self-pay | Admitting: Internal Medicine

## 2022-03-28 DIAGNOSIS — R928 Other abnormal and inconclusive findings on diagnostic imaging of breast: Secondary | ICD-10-CM

## 2022-03-31 ENCOUNTER — Encounter: Payer: Self-pay | Admitting: Internal Medicine

## 2022-03-31 ENCOUNTER — Telehealth: Payer: Self-pay | Admitting: *Deleted

## 2022-03-31 DIAGNOSIS — M810 Age-related osteoporosis without current pathological fracture: Secondary | ICD-10-CM | POA: Insufficient documentation

## 2022-03-31 NOTE — Telephone Encounter (Signed)
Med list updated

## 2022-04-01 ENCOUNTER — Encounter: Payer: Self-pay | Admitting: Physician Assistant

## 2022-04-01 ENCOUNTER — Ambulatory Visit: Payer: Medicare Other | Admitting: Physician Assistant

## 2022-04-01 VITALS — BP 124/70 | HR 89 | Resp 20 | Ht 60.0 in | Wt 187.0 lb

## 2022-04-01 DIAGNOSIS — G3184 Mild cognitive impairment, so stated: Secondary | ICD-10-CM

## 2022-04-01 NOTE — Patient Instructions (Addendum)
It was a pleasure to see you today at our office.   Recommendations:  Replenish iron and B12 Control of all of the CV risk factors.  Follow up 12 months   RECOMMENDATIONS FOR ALL PATIENTS WITH MEMORY PROBLEMS: 1. Continue to exercise (Recommend 30 minutes of walking everyday, or 3 hours every week) 2. Increase social interactions - continue going to [place] and enjoy social gatherings with friends and family 3. Eat healthy, avoid fried foods and eat more fruits and vegetables 4. Maintain adequate blood pressure, blood sugar, and blood cholesterol level. Reducing the risk of stroke and cardiovascular disease also helps promoting better memory. 5. Avoid stressful situations. Live a simple life and avoid aggravations. Organize your time and prepare for the next day in anticipation. 6. Sleep well, avoid any interruptions of sleep and avoid any distractions in the bedroom that may interfere with adequate sleep quality 7. Avoid sugar, avoid sweets as there is a strong link between excessive sugar intake, diabetes, and cognitive impairment We discussed the Mediterranean diet, which has been shown to help patients reduce the risk of progressive memory disorders and reduces cardiovascular risk. This includes eating fish, eat fruits and green leafy vegetables, nuts like almonds and hazelnuts, walnuts, and also use olive oil. Avoid fast foods and fried foods as much as possible. Avoid sweets and sugar as sugar use has been linked to worsening of memory function.  There is always a concern of gradual progression of memory problems. If this is the case, then we may need to adjust level of care according to patient needs. Support, both to the patient and caregiver, should then be put into place.     FALL PRECAUTIONS: Be cautious when walking. Scan the area for obstacles that may increase the risk of trips and falls. When getting up in the mornings, sit up at the edge of the bed for a few minutes before  getting out of bed. Consider elevating the bed at the head end to avoid drop of blood pressure when getting up. Walk always in a well-lit room (use night lights in the walls). Avoid area rugs or power cords from appliances in the middle of the walkways. Use a walker or a cane if necessary and consider physical therapy for balance exercise. Get your eyesight checked regularly.  FINANCIAL OVERSIGHT: Supervision, especially oversight when making financial decisions or transactions is also recommended.  HOME SAFETY: Consider the safety of the kitchen when operating appliances like stoves, microwave oven, and blender. Consider having supervision and share cooking responsibilities until no longer able to participate in those. Accidents with firearms and other hazards in the house should be identified and addressed as well.   ABILITY TO BE LEFT ALONE: If patient is unable to contact 911 operator, consider using LifeLine, or when the need is there, arrange for someone to stay with patients. Smoking is a fire hazard, consider supervision or cessation. Risk of wandering should be assessed by caregiver and if detected at any point, supervision and safe proof recommendations should be instituted.  MEDICATION SUPERVISION: Inability to self-administer medication needs to be constantly addressed. Implement a mechanism to ensure safe administration of the medications.   DRIVING: Regarding driving, in patients with progressive memory problems, driving will be impaired. We advise to have someone else do the driving if trouble finding directions or if minor accidents are reported. Independent driving assessment is available to determine safety of driving.  Consider [place] [address] [place] [address]  [place], [place] [address]  [phone]  Hours of Operation Mondays to Thursdays: 8 am to 8 pm,Fridays: 9 am to 8 pm, Saturdays: 9 am to 1 pm Sundays:  Closed  https://www.[place].gov/departments/parks-recreation/active-adults-50/[facility]   If you are interested in the driving assessment, you can contact the following:  [employer] in [place]  [place] [phone] or [phone]    [place] refers to food and lifestyle choices that are based on the traditions of countries located on the [employer]. This way of eating has been shown to help prevent certain conditions and improve outcomes for people who have chronic diseases, like kidney disease and heart disease. What are tips for following this plan? Lifestyle  Cook and eat meals together with your family, when possible. Drink enough fluid to keep your urine clear or pale yellow. Be physically active every day. This includes: Aerobic exercise like running or swimming. Leisure activities like gardening, walking, or housework. Get 7-8 hours of sleep each night. If recommended by your health care provider, drink red wine in moderation. This means 1 glass a day for nonpregnant women and 2 glasses a day for men. A glass of wine equals 5 oz (150 mL). Reading food labels  Check the serving size of packaged foods. For foods such as rice and pasta, the serving size refers to the amount of cooked product, not dry. Check the total fat in packaged foods. Avoid foods that have saturated fat or trans fats. Check the ingredients list for added sugars, such as corn syrup. Shopping  At the grocery store, buy most of your food from the areas near the walls of the store. This includes: Fresh fruits and vegetables (produce). Grains, beans, nuts, and seeds. Some of these may be available in unpackaged forms or large amounts (in bulk). Fresh seafood. Poultry and eggs. Low-fat dairy products. Buy whole ingredients instead  of prepackaged foods. Buy fresh fruits and vegetables in-season from local farmers markets. Buy frozen fruits and vegetables in resealable bags. If you do not have access to quality fresh seafood, buy precooked frozen shrimp or canned fish, such as tuna, salmon, or sardines. Buy small amounts of raw or cooked vegetables, salads, or olives from the deli or salad bar at your store. Stock your pantry so you always have certain foods on hand, such as olive oil, canned tuna, canned tomatoes, rice, pasta, and beans. Cooking  Cook foods with extra-virgin olive oil instead of using butter or other vegetable oils. Have meat as a side dish, and have vegetables or grains as your main dish. This means having meat in small portions or adding small amounts of meat to foods like pasta or stew. Use beans or vegetables instead of meat in common dishes like chili or lasagna. Experiment with different cooking methods. Try roasting or broiling vegetables instead of steaming or sauteing them. Add frozen vegetables to soups, stews, pasta, or rice. Add nuts or seeds for added healthy fat at each meal. You can add these to yogurt, salads, or vegetable dishes. Marinate fish or vegetables using olive oil, lemon juice, garlic, and fresh herbs. Meal planning  Plan to eat 1 vegetarian meal one day each week. Try to work up to 2 vegetarian meals, if possible. Eat seafood 2 or more times a week. Have healthy snacks readily available, such as: Vegetable sticks with hummus. Greek yogurt. Fruit and nut trail mix. Eat balanced meals throughout the week. This includes: Fruit:  2-3 servings a day Vegetables: 4-5 servings a day Low-fat dairy: 2 servings a day Fish, poultry, or lean meat: 1 serving a day Beans and legumes: 2 or more servings a week Nuts and seeds: 1-2 servings a day Whole grains: 6-8 servings a day Extra-virgin olive oil: 3-4 servings a day Limit red meat and sweets to only a few servings a month What are  my food choices? Mediterranean diet Recommended Grains: Whole-grain pasta. Brown rice. Bulgar wheat. Polenta. Couscous. Whole-wheat bread. [place]. Vegetables: Artichokes. Beets. Broccoli. Cabbage. Carrots. Eggplant. Green beans. Chard. Kale. Spinach. Onions. Leeks. Peas. Squash. Tomatoes. Peppers. Radishes. Fruits: Apples. Apricots. Avocado. Berries. Bananas. Cherries. Dates. Figs. Grapes. Lemons. Melon. Oranges. Peaches. Plums. Pomegranate. Meats and other protein foods: Beans. Almonds. Sunflower seeds. Pine nuts. Peanuts. [place]. Salmon. Scallops. Shrimp. Arnold. Tilapia. Clams. Oysters. Eggs. Dairy: Low-fat milk. Cheese. Greek yogurt. Beverages: Water. Red wine. Herbal tea. Fats and oils: Extra virgin olive oil. Avocado oil. Grape seed oil. Sweets and desserts: [place] yogurt with honey. Baked apples. Poached pears. Trail mix. Seasoning and other foods: Basil. Cilantro. Coriander. Cumin. Mint. Parsley. Sage. Rosemary. Tarragon. Garlic. Oregano. Thyme. Pepper. Balsalmic vinegar. Tahini. Hummus. Tomato sauce. Olives. Mushrooms. Limit these Grains: Prepackaged pasta or rice dishes. Prepackaged cereal with added sugar. Vegetables: Deep fried potatoes (french fries). Fruits: Fruit canned in syrup. Meats and other protein foods: Beef. Pork. Lamb. Poultry with skin. Hot dogs. Berniece Salines. Dairy: Ice cream. Sour cream. Whole milk. Beverages: Juice. Sugar-sweetened soft drinks. Beer. Liquor and spirits. Fats and oils: Butter. Canola oil. Vegetable oil. Beef fat (tallow). Lard. Sweets and desserts: Cookies. Cakes. Pies. Candy. Seasoning and other foods: Mayonnaise. Premade sauces and marinades. The items listed may not be a complete list. Talk with your dietitian about what dietary choices are right for you. Summary The Mediterranean diet includes both food and lifestyle choices. Eat a variety of fresh fruits and vegetables, beans, nuts, seeds, and whole grains. Limit the amount of red meat and sweets  that you eat. Talk with your health care provider about whether it is safe for you to drink red wine in moderation. This means 1 glass a day for nonpregnant women and 2 glasses a day for men. A glass of wine equals 5 oz (150 mL). This information is not intended to replace advice given to you by your health care provider. Make sure you discuss any questions you have with your health care provider. Document Released: 04/03/2016 Document Revised: 05/06/2016 Document Reviewed: 04/03/2016 Elsevier Interactive Patient Education  2017 [employer].  .cre

## 2022-04-01 NOTE — Progress Notes (Signed)
Assessment/Plan:   Mild Cognitive Impairment likely of vascular etiology  Tammy [name] is a very pleasant 81 y.o. LH female with  a history of hypertension, hyperlipidemia, depression, iron deficiency anemia, B12 deficiency, arthritis, presenting today in follow-up for evaluation of memory loss. Last MoCA was 28/30 in February 2023. She is stable from the cognitive standpoint.  Follow up in 1 year per patient request  continue B12 supplementation and Iron therapy. Recommend good control of cardiovascular risk factors, recommend baby aspirin daily. No antidementia medication is indicated at this time.   Case discussed with Dr. Delice Lesch who agrees with the plan     Subjective:   This patient is accompanied in the office by her daughter who supplements the history.  Previous records as well as any outside records available were reviewed prior to todays visit.      Any changes in memory since last visit?  Patient denies.  She continues to have mild short-term memory changes, but long-term memory is good. Patient lives with: Alone repeats oneself?  Endorsed Disoriented when walking into a room?  Patient denies   Leaving objects in unusual places?  Patient denies   Ambulates  with difficulty?   Patient denies . Uses a cane as needed Recent falls?  Patient denies   Any head injuries?  Patient denies   History of seizures?   Patient denies   Wandering behavior?  Patient denies   Patient drives?  Continues to drive, denies any issues. Any mood changes since last visit?  Patient denies   Any worsening depression?:  Patient denies   Hallucinations?  Patient denies   Paranoia?  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  Without vivid dreams, REM behavior or sleepwalking   History of sleep apnea?  Patient denies   Any hygiene concerns?  Patient denies   Independent of bathing and dressing?  Endorsed   Does the patient needs help with medications?  Denies Who is in charge of the finances?   is in charge    Any changes in appetite?  Patient denies   Patient have trouble swallowing? Patient denies   Does the patient cook?  Patient denies   Any kitchen accidents such as leaving the stove on? Patient denies   Any headaches?  Patient denies   Double vision? Patient denies   Any focal numbness or tingling?  Patient denies   Chronic back pain Patient denies   Unilateral weakness?  Patient denies   Any tremors?  Patient denies   Any history of anosmia?  Patient denies   Any incontinence of urine?  Patient denies   Any bowel dysfunction?   Patient denies       Initial Visit 10/02/21 The patient is seen in neurologic consultation at the request of Isaac Bliss, [facility]* for the evaluation of memory.  The patient is accompanied by her daughter who supplements the history. This is a delightful 81 y.o. 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  She reports that she was caring for an old lady and had to begin writing and at least the findings on the things to do, because she was having difficulty remembering them.  She also was feeling much more tired than prior.  "I used to be much heavier, I decided to lose weight, lost 25 pounds in a diet  and I still was tired and my memory was not good ".  She states that she could not compute, could not repeat what is was being said.  This was troubling to her, causing some depression.  She denies any difficulties with long-term memory.  She lives alone, and is fairly independent.  She does write everything since that incident.  She does her own cooking, cleaning and laundry, but she becomes very short of breath when going in heels, which was felt to be due to iron deficiency.  She is not disoriented when walking into her room.  She denies leaving objects in unusual places.  She ambulates without significant  difficulties.  She had a recent fall which brought her to the ED recently, after her toe was caught in the pillow and "took a nose dive hurting the left knee and the right shoulder ".  She had a nosebleed, but did not have any fractures.  She denies wandering off.  She continues to drive short distances, if she has to do a long distance, she uses a GPS.  She denies irritability.  She does not do crossword puzzles or word finding often.  She does like to watch Jeopardy.  Inform her night nurse, she feels that her sleep cycle has always been different.  She goes to bed at 11 PM, and she may be waking up an hour or 2 later.  She never had a sleep study.  She states that she could be up again at 4 in the morning.  She does feel slightly tired when waking up, and denies sleepwalking, hallucinations or paranoia.  When she sleeps she has vivid dreams.  There are no hygiene concerns, she is independent of bathing and dressing, and she puts the medications in a pillbox without missing doses.  Her daughter manages the finances.  Her appetite is good, denies trouble swallowing.  She cooks, randomly, she may have seldom, she may have left the stove on accidentally.  She denies any headaches, double vision, dizziness, focal numbness or tingling, unilateral weakness, tremors or anosmia.  She complains of her feet being very cold.  No history of seizures.  Denies any urine incontinence, retention, constipation or diarrhea, sleep apnea, alcohol or tobacco history, family history remarkable for mother with dementia.   CT of the head without contrast 09/16/2018 no acute intracranial pathology.2. Moderate chronic white matter microangiopathy.  Past Medical History:  Diagnosis Date   Anxiety    Arthritis    knees and back   Bruised rib 01/18/2014   x-rayed   Chicken pox    DDD (degenerative disc disease), lumbar    Depression    mild, never on medication   Deviated septum    GERD (gastroesophageal reflux disease)     Heart murmur    told in 2012 benign   Hemorrhoids    High cholesterol    borderline, no meds   Hyperglycemia    borderline   Hypertension    high blood pressure readings, no meds   Osteoarthritis    Pre-diabetes    diet- controlled   Rib pain on left side 01/20/2014    xray done seen by MD 01/20/14 in [facility]    Scoliosis    Varicose vein      Past Surgical History:  Procedure Laterality Date   ABDOMINAL HYSTERECTOMY  1972   COLONOSCOPY  05/27/2013   DENTAL SURGERY  2007   left jaw bone graft, 2 implants on left, 2 implants on  right   HEMORRHOID BANDING  06/03/13   HEMORROIDECTOMY  1975   INCISION AND DRAINAGE BREAST ABSCESS Bilateral 1966, 1967   MASS EXCISION N/A 04/22/2013   Procedure: EXCISION MULTPILE soft tissue masses and abnormal skin lesion left thigh /left chest/ left posterior shoulder/ right flank ;  Surgeon: Harl Bowie, MD;  Location: [facility];  Service: General;  Laterality: N/A;  multiple sites:  left thigh, left shoulder, right flank, right upper arm   OVARIAN CYST SURGERY Bilateral 1991   OVARIAN CYST SURGERY     SEPTOPLASTY Bilateral 12/16/2016   Procedure: SEPTOPLASTY;  Surgeon: Leta Baptist, MD;  Location: [facility];  Service: ENT;  Laterality: Bilateral;   [place] Right 07/25/2013   Procedure: RIGHT TOTAL KNEE ARTHROPLASTY;  Surgeon: Gearlean Alf, MD;  Location: [facility];  Service: Orthopedics;  Laterality: Right;  Left knee cortisone injection   TOTAL KNEE ARTHROPLASTY Left 01/30/2014   Procedure: LEFT TOTAL KNEE ARTHROPLASTY;  Surgeon: Gearlean Alf, MD;  Location: [facility];  Service: Orthopedics;  Laterality: Left;   TUBAL LIGATION  1970     PREVIOUS MEDICATIONS:   CURRENT MEDICATIONS:  Outpatient Encounter Medications as of 04/01/2022  Medication Sig   amLODipine-valsartan (EXFORGE) 5-160 MG tablet Take 1 tablet by mouth daily. (Patient taking differently: Take 1 tablet by mouth daily.  In the morning)   atorvastatin (LIPITOR) 40 MG tablet Take 40 mg by mouth at bedtime.   bisacodyl (DULCOLAX) 5 MG EC tablet Take 5 mg by mouth in the morning and at bedtime.   cholecalciferol (VITAMIN D3) 25 MCG (1000 UNIT) tablet Take 1,000 Units by mouth daily.   cyanocobalamin (,VITAMIN B-12,) 1000 MCG/ML injection inject 1 ml once a month   cyclobenzaprine (FLEXERIL) 10 MG tablet Take 10 mg by mouth 3 (three) times daily as needed.   gabapentin (NEURONTIN) 100 MG capsule at bedtime.   Iron, Ferrous Sulfate, 325 (65 Fe) MG TABS Take 325 mg by mouth 2 (two) times daily. (Patient taking differently: Take 325 mg by mouth daily.)   Multiple Vitamins-Minerals (MULTI-VITAMIN GUMMIES PO) Take by mouth.   oxyCODONE (OXY IR/ROXICODONE) 5 MG immediate release tablet Take 1-2 tablets (5-10 mg total) by mouth every 3 (three) hours as needed for breakthrough pain. (Patient taking differently: Take 10 mg by mouth every 3 (three) hours as needed for breakthrough pain.)   oxymetazoline (AFRIN) 0.05 % nasal spray Place 1 spray into both nostrils 2 (two) times daily.   pantoprazole (PROTONIX) 40 MG tablet Take 1 tablet (40 mg total) by mouth 2 (two) times daily.   SYRINGE-NEEDLE, DISP, 3 ML (BD SAFETYGLIDE SYRINGE/NEEDLE) 25G X 1" 3 ML MISC Use to inject b12   traMADol (ULTRAM) 50 MG tablet Take 1-2 tablets (50-100 mg total) by mouth every 6 (six) hours as needed for moderate pain. (Patient taking differently: Take 50-100 mg by mouth at bedtime as needed for moderate pain.)   Valerian 450 MG CAPS Take by mouth.   aspirin EC 81 MG tablet Take 1 tablet (81 mg total) by mouth daily. Swallow whole. (Patient not taking: Reported on 04/01/2022)   Facility-Administered Encounter Medications as of 04/01/2022  Medication   cyanocobalamin ((VITAMIN B-12)) injection 1,000 mcg     Objective:     PHYSICAL EXAMINATION:    VITALS:   Vitals:   04/01/22 1326  BP: 124/70  Pulse: 89  Resp: 20  SpO2: 93%  Weight: 187 lb  (  84.8 kg)  Height: 5' (1.524 m)    GEN:  The patient appears stated age and is in NAD. HEENT:  Normocephalic, atraumatic.   Neurological examination:  General: NAD, well-groomed, appears stated age. Orientation: The patient is alert. Oriented to person, place and date Cranial nerves: There is good facial symmetry.The speech is fluent and clear. No aphasia or dysarthria. Fund of knowledge is appropriate. Recent memory impaired and remote memory is normal.  Attention and concentration are normal.  Able to name objects and repeat phrases.  Hearing is intact to conversational tone.    Sensation: Sensation is intact to light touch throughout Motor: Strength is at least antigravity x4. Tremors: none  DTR's 2/4 in UE/LE      10/02/2021    2:00 PM  Montreal Cognitive Assessment   Visuospatial/ Executive (0/5) 4  Naming (0/3) 2  Attention: Read list of digits (0/2) 2  Attention: Read list of letters (0/1) 1  Attention: Serial 7 subtraction starting at 100 (0/3) 3  Language: Repeat phrase (0/2) 2  Language : Fluency (0/1) 1  Abstraction (0/2) 2  Delayed Recall (0/5) 5  Orientation (0/6) 6  Total 28  Adjusted Score (based on education) 28        No data to display             Movement examination: Tone: There is normal tone in the UE/LE Abnormal movements:  no tremor.  No myoclonus.  No asterixis.   Coordination:  There is no decremation with RAM's. Normal finger to nose  Gait and Station: The patient has no difficulty arising out of a deep-seated chair without the use of the hands. The patient's stride length is good.  Gait is cautious and narrow.   Thank you for allowing [place] the opportunity to participate in the care of this nice patient. Please do not hesitate to contact us for any questions or concerns.   Total time spent on today's visit was 40  minutes dedicated to this patient today, preparing to see patient, examining the patient, ordering tests and/or medications and  counseling the patient, documenting clinical information in the EHR or other health record, independently interpreting results and communicating results to the patient/family, discussing treatment and goals, answering patient's questions and coordinating care.  Cc:  Isaac Bliss, Rayford Halsted, MD  Sharene Butters 04/03/2022 9:04 AM   Cc:  Isaac Bliss, Rayford Halsted, MD Sharene Butters, PA-C

## 2022-04-02 ENCOUNTER — Ambulatory Visit
Admission: RE | Admit: 2022-04-02 | Discharge: 2022-04-02 | Disposition: A | Discharge status: Home / Self Care | Payer: Medicare Other | Source: Ambulatory Visit | Attending: Internal Medicine | Admitting: Internal Medicine

## 2022-04-02 ENCOUNTER — Other Ambulatory Visit: Payer: Self-pay | Admitting: Internal Medicine

## 2022-04-02 DIAGNOSIS — R928 Other abnormal and inconclusive findings on diagnostic imaging of breast: Secondary | ICD-10-CM | POA: Diagnosis not present

## 2022-04-02 DIAGNOSIS — N6489 Other specified disorders of breast: Secondary | ICD-10-CM

## 2022-04-07 IMAGING — MR MR HEAD W/O CM
11 series · 48 of 48 positions shown · non-contrast
Comparison: Head CT 09/16/2021.

CLINICAL DATA: Memory loss. Fall, hematoma. Additional history
provided by scanning technologist: Fall 4 months ago (hitting head),
short-term memory loss/aphasia, veering to the left when walking.

EXAM:
MRI HEAD WITHOUT CONTRAST
TECHNIQUE: Multiplanar, multiecho pulse sequences of the brain and surrounding
structures were obtained without intravenous contrast.

[Series 5: T1 · sagittal · 4.0mm · 0.75mm/px · 2 of 31 slices shown (1 of 2)]
[im 1/31]
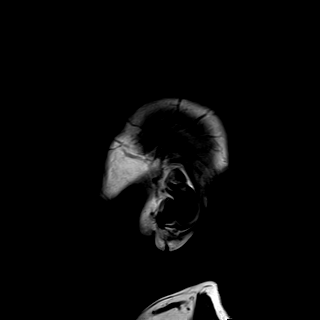
[im 31/31]
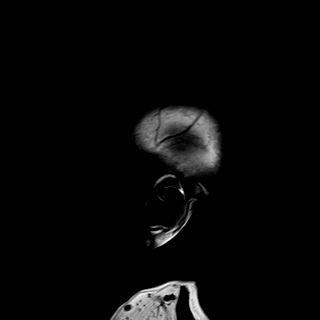

[Series 6: DWI · axial · 3.0mm · 0.94mm/px · z∈[-75,+65]mm · 10 of 160 slices shown (1 of 3)]
[im 1/160]
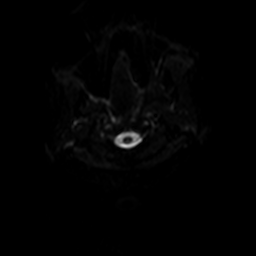
[im 18/160]
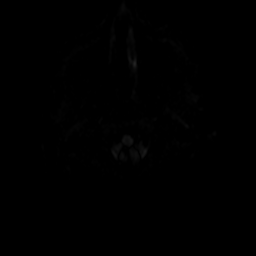
[im 36/160]
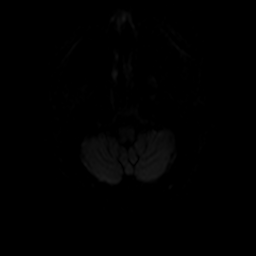
[im 54/160]
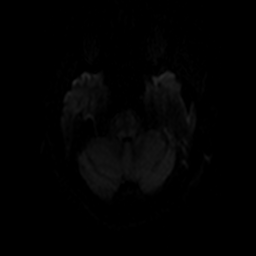
[im 71/160]
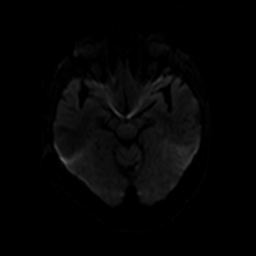
[im 89/160]
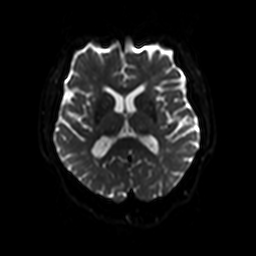
[im 107/160]
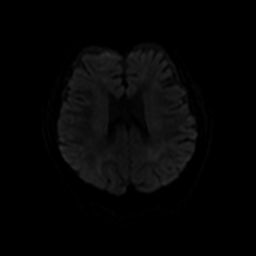
[im 124/160]
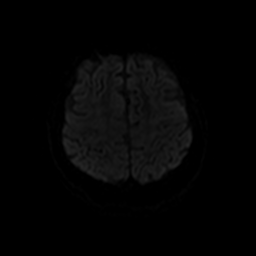
[im 142/160]
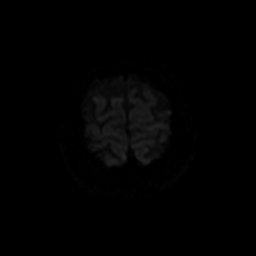
[im 160/160]
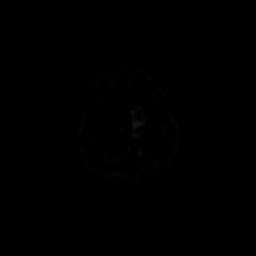

[Series 7: ax dwi_tracew · axial · 3.0mm · 0.94mm/px · z∈[-75,+65]mm · 5 of 80 slices shown]
[im 1/80]
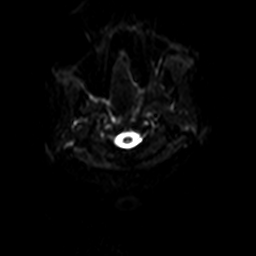
[im 20/80]
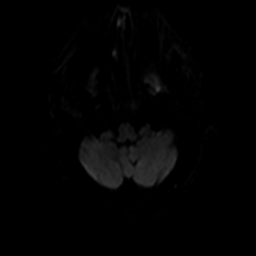
[im 40/80]
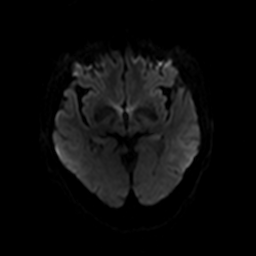
[im 60/80]
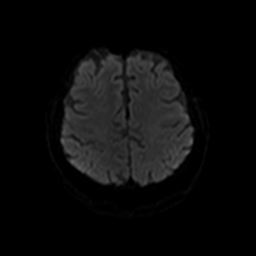
[im 80/80]
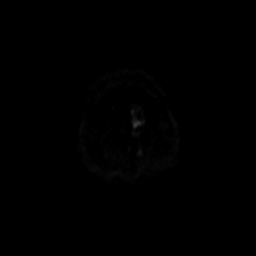

[Series 8: ax dwi_adc · axial · 3.0mm · 0.94mm/px · z∈[-75,+65]mm · 3 of 40 slices shown]
[im 1/40]
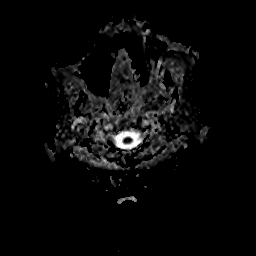
[im 20/40]
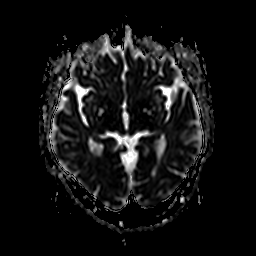
[im 40/40]
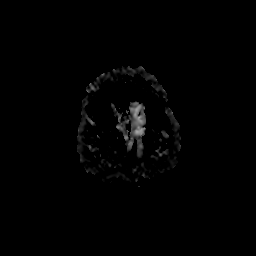

[Series 9: DWI · coronal · 5.0mm · 1.44mm/px · 4 of 60 slices shown (2 of 3)]
[im 1/60]
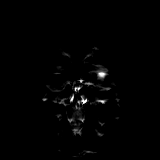
[im 20/60]
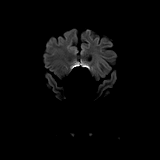
[im 40/60]
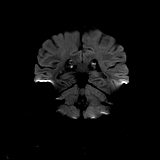
[im 60/60]
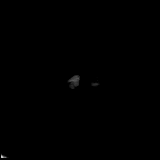

[Series 10: DWI · coronal · 5.0mm · 1.44mm/px · 2 of 30 slices shown (3 of 3)]
[im 1/30]
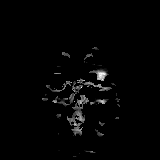
[im 30/30]
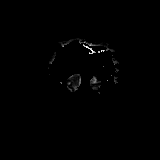

[Series 11: T2 · axial · 4.0mm · 0.36mm/px · z∈[-68,+77]mm · 2 of 29 slices shown (1 of 2)]
[im 1/29]
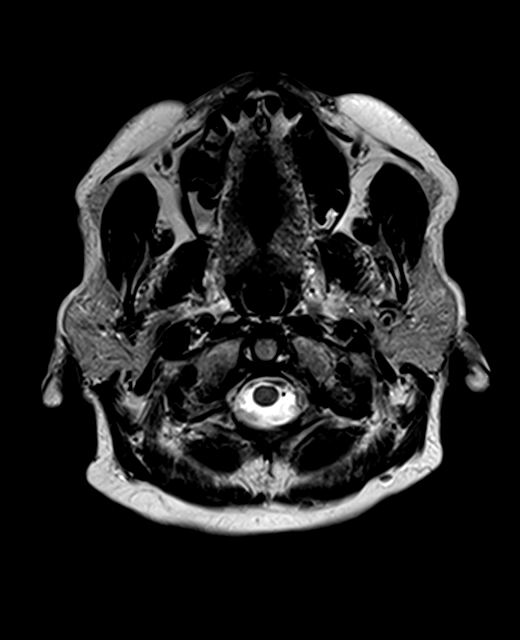
[im 29/29]
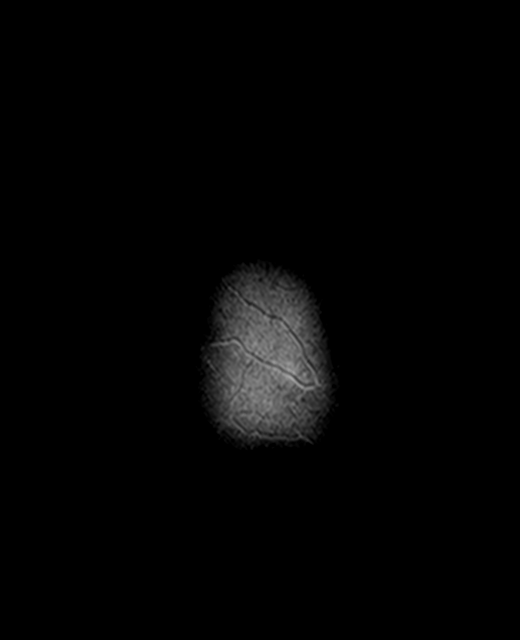

[Series 12: FLAIR · axial · 3.0mm · 0.72mm/px · z∈[-72,+78]mm · 2 of 26 slices shown]
[im 1/26]
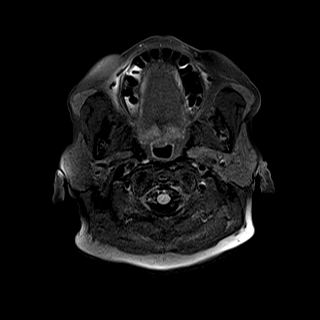
[im 26/26]
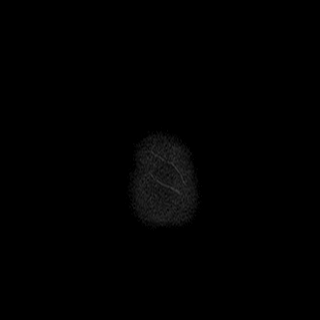

[Series 13: swi_images · axial · 1.5mm · 0.90mm/px · z∈[-66,+76]mm · 6 of 96 slices shown]
[im 1/96]
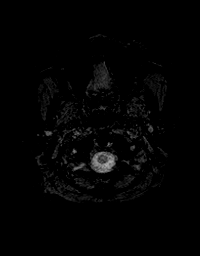
[im 20/96]
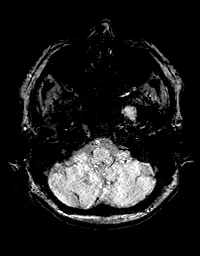
[im 39/96]
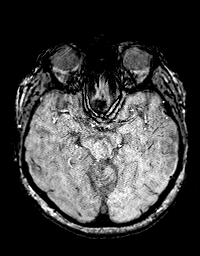
[im 58/96]
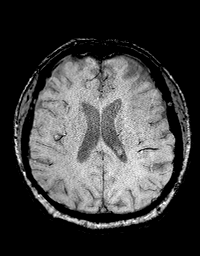
[im 77/96]
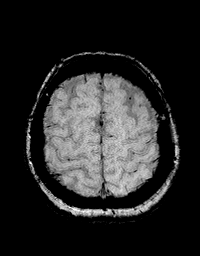
[im 96/96]
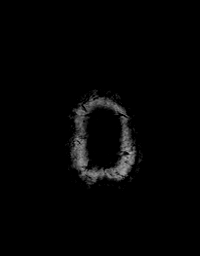

[Series 15: T1 · axial · 1.0mm · 0.94mm/px · z∈[-84,+75]mm · 10 of 160 slices shown (2 of 2)]
[im 1/160]
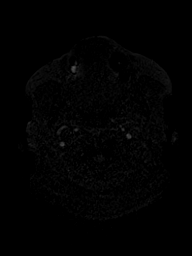
[im 18/160]
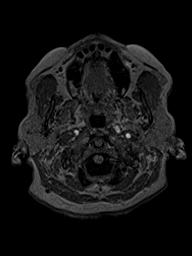
[im 36/160]
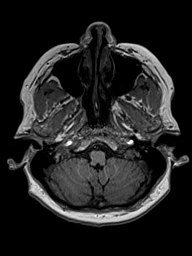
[im 54/160]
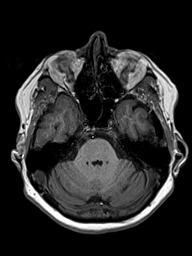
[im 71/160]
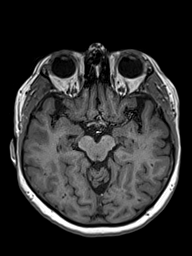
[im 89/160]
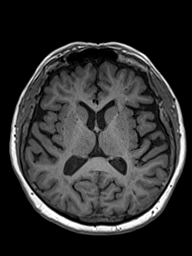
[im 107/160]
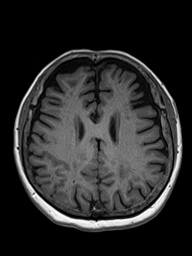
[im 124/160]
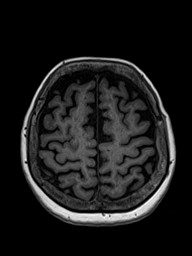
[im 142/160]
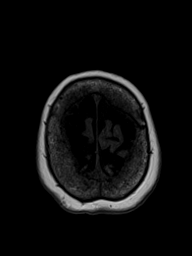
[im 160/160]
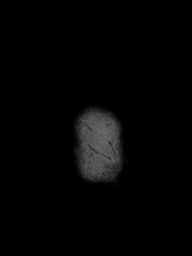

[Series 16: T2 · coronal · 4.0mm · 0.36mm/px · 2 of 35 slices shown (2 of 2)]
[im 1/35]
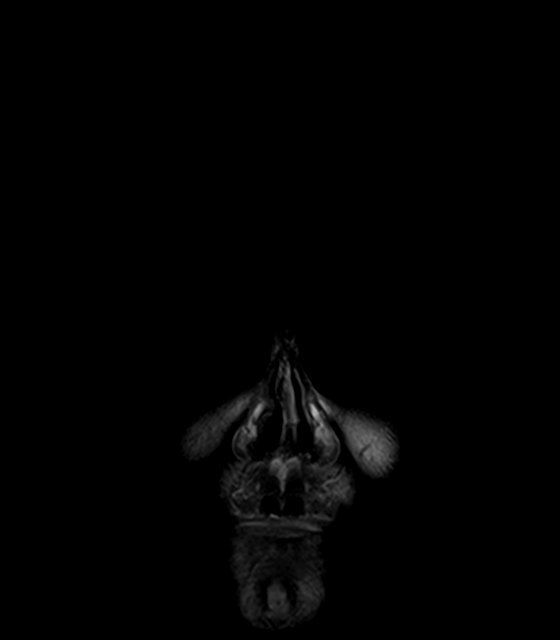
[im 35/35]
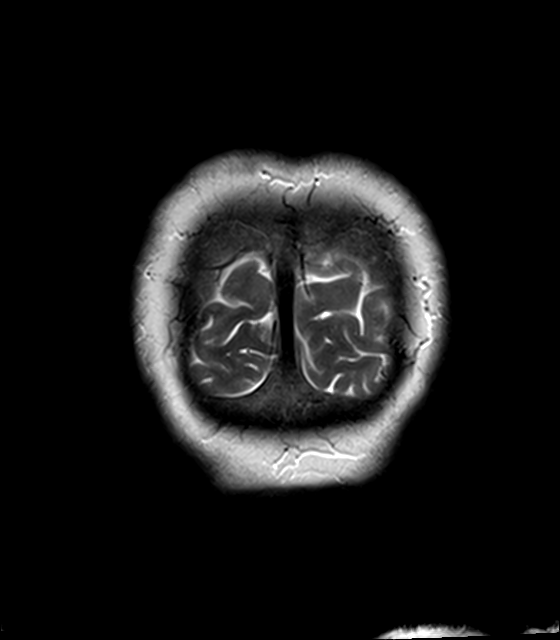

[48 of 48 positions shown; findings below may reference images not displayed]

FINDINGS: Brain:

Mild generalized cerebral and cerebellar atrophy.

Mild-to-moderate multifocal T2 FLAIR hyperintense signal abnormality
within the cerebral white matter, nonspecific but compatible with
chronic small vessel ischemic disease.

There is no acute infarct.

No evidence of an intracranial mass.

No chronic intracranial blood products.

No extra-axial fluid collection.

No midline shift.

Vascular: Maintained flow voids within the proximal large arterial
vessels.

Skull and upper cervical spine: No focal suspicious marrow lesion.
Incompletely assessed cervical spondylosis. Grade 1 anterolisthesis
at C3-C4 and C4-C5.

Sinuses/Orbits: Visualized orbits show no acute finding. Trace
mucosal thickening within the bilateral ethmoid sinuses.
IMPRESSION: No evidence of acute intracranial abnormality.

Mild-to-moderate chronic small vessel ischemic changes within the
cerebral white matter.

Mild generalized cerebral and cerebellar atrophy.

Incompletely assessed cervical spondylosis.

Grade 1 anterolisthesis at C3-C4 and C4-C5.

## 2022-04-09 ENCOUNTER — Encounter: Payer: Self-pay | Admitting: Internal Medicine

## 2022-04-09 ENCOUNTER — Telehealth (INDEPENDENT_AMBULATORY_CARE_PROVIDER_SITE_OTHER): Payer: Medicare Other | Admitting: Internal Medicine

## 2022-04-09 VITALS — Wt 185.0 lb

## 2022-04-09 DIAGNOSIS — M810 Age-related osteoporosis without current pathological fracture: Secondary | ICD-10-CM | POA: Diagnosis not present

## 2022-04-09 NOTE — Progress Notes (Signed)
Virtual Visit via Telephone Note  I connected with Tammy [name] on 04/09/22 at  3:30 PM EDT by telephone and verified that I am speaking with the correct person using two identifiers.   I discussed the limitations, risks, security and privacy concerns of performing an evaluation and management service by telephone and the availability of in person appointments. I also discussed with the patient that there may be a patient responsible charge related to this service. The patient expressed understanding and agreed to proceed.  Location patient: home Location provider: work office Participants present for the call: patient, provider Patient did not have a visit in the prior 7 days to address this/these issue(s).   History of Present Illness:  This is a scheduled visit to discuss her recently diagnosed osteoporosis.  Her DEXA scan from August 2023 showed a right femoral neck T score of -2.5.  In the interim she was recalled for an abnormality on her mammogram that is BI-RADS suspicious.  She is scheduled for a biopsy day after tomorrow.   Observations/Objective: Patient sounds cheerful and well on the phone. I do not appreciate any increased work of breathing. Speech and thought processing are grossly intact. Patient reported vitals: None reported   Current Outpatient Medications:    amLODipine-valsartan (EXFORGE) 5-160 MG tablet, Take 1 tablet by mouth daily. (Patient taking differently: Take 1 tablet by mouth daily. In the morning), Disp: 30 tablet, Rfl: 11   aspirin EC 81 MG tablet, Take 1 tablet (81 mg total) by mouth daily. Swallow whole., Disp: 30 tablet, Rfl: 12   atorvastatin (LIPITOR) 40 MG tablet, Take 40 mg by mouth at bedtime., Disp: , Rfl:    bisacodyl (DULCOLAX) 5 MG EC tablet, Take 5 mg by mouth in the morning and at bedtime., Disp: , Rfl:    cholecalciferol (VITAMIN D3) 25 MCG (1000 UNIT) tablet, Take 1,000 Units by mouth daily., Disp: , Rfl:    cyanocobalamin  (,VITAMIN B-12,) 1000 MCG/ML injection, inject 1 ml once a month, Disp: 6 mL, Rfl: 11   cyclobenzaprine (FLEXERIL) 10 MG tablet, Take 10 mg by mouth 3 (three) times daily as needed., Disp: , Rfl:    gabapentin (NEURONTIN) 100 MG capsule, at bedtime., Disp: , Rfl:    Iron, Ferrous Sulfate, 325 (65 Fe) MG TABS, Take 325 mg by mouth 2 (two) times daily. (Patient taking differently: Take 325 mg by mouth daily.), Disp: 30 tablet, Rfl:    Multiple Vitamins-Minerals (MULTI-VITAMIN GUMMIES PO), Take by mouth., Disp: , Rfl:    oxyCODONE (OXY IR/ROXICODONE) 5 MG immediate release tablet, Take 1-2 tablets (5-10 mg total) by mouth every 3 (three) hours as needed for breakthrough pain. (Patient taking differently: Take 10 mg by mouth every 3 (three) hours as needed for breakthrough pain.), Disp: 80 tablet, Rfl: 0   oxymetazoline (AFRIN) 0.05 % nasal spray, Place 1 spray into both nostrils 2 (two) times daily., Disp: , Rfl:    pantoprazole (PROTONIX) 40 MG tablet, Take 1 tablet (40 mg total) by mouth 2 (two) times daily., Disp: 180 tablet, Rfl: 1   SYRINGE-NEEDLE, DISP, 3 ML (BD SAFETYGLIDE SYRINGE/NEEDLE) 25G X 1" 3 ML MISC, Use to inject b12, Disp: 100 each, Rfl: 11   traMADol (ULTRAM) 50 MG tablet, Take 1-2 tablets (50-100 mg total) by mouth every 6 (six) hours as needed for moderate pain. (Patient taking differently: Take 50-100 mg by mouth at bedtime as needed for moderate pain.), Disp: 60 tablet, Rfl: 0   Valerian  450 MG CAPS, Take by mouth., Disp: , Rfl:   Current Facility-Administered Medications:    cyanocobalamin ((VITAMIN B-12)) injection 1,000 mcg, 1,000 mcg, Intramuscular, Q30 days, Isaac Bliss, Rayford Halsted, MD, 1,000 mcg at 11/05/21 1414  Review of Systems:  Constitutional: Denies fever, chills, diaphoresis, appetite change and fatigue.  HEENT: Denies photophobia, eye pain, redness, hearing loss, ear pain, congestion, sore throat, rhinorrhea, sneezing, mouth sores, trouble swallowing, neck pain,  neck stiffness and tinnitus.   Respiratory: Denies SOB, DOE, cough, chest tightness,  and wheezing.   Cardiovascular: Denies chest pain, palpitations and leg swelling.  Gastrointestinal: Denies nausea, vomiting, abdominal pain, diarrhea, constipation, blood in stool and abdominal distention.  Genitourinary: Denies dysuria, urgency, frequency, hematuria, flank pain and difficulty urinating.  Endocrine: Denies: hot or cold intolerance, sweats, changes in hair or nails, polyuria, polydipsia. Musculoskeletal: Denies myalgias, back pain, joint swelling, arthralgias and gait problem.  Skin: Denies pallor, rash and wound.  Neurological: Denies dizziness, seizures, syncope, weakness, light-headedness, numbness and headaches.  Hematological: Denies adenopathy. Easy bruising, personal or family bleeding history  Psychiatric/Behavioral: Denies suicidal ideation, mood changes, confusion, nervousness, sleep disturbance and agitation   Assessment and Plan:  Age-related osteoporosis without current pathological fracture  -After discussion we have decided to start Prolia.  We will initiate insurance approval process.  I discussed the assessment and treatment plan with the patient. The patient was provided an opportunity to ask questions and all were answered. The patient agreed with the plan and demonstrated an understanding of the instructions.   The patient was advised to call back or seek an in-person evaluation if the symptoms worsen or if the condition fails to improve as anticipated.  I provided 22 minutes of non-face-to-face time during this encounter.   Lelon Frohlich, MD [facility]

## 2022-04-11 ENCOUNTER — Ambulatory Visit
Admission: RE | Admit: 2022-04-11 | Discharge: 2022-04-11 | Disposition: A | Discharge status: Home / Self Care | Payer: Medicare Other | Source: Ambulatory Visit | Attending: Internal Medicine | Admitting: Internal Medicine

## 2022-04-11 DIAGNOSIS — N6489 Other specified disorders of breast: Secondary | ICD-10-CM

## 2022-04-11 DIAGNOSIS — R928 Other abnormal and inconclusive findings on diagnostic imaging of breast: Secondary | ICD-10-CM | POA: Diagnosis not present

## 2022-04-17 DIAGNOSIS — M5416 Radiculopathy, lumbar region: Secondary | ICD-10-CM | POA: Diagnosis not present

## 2022-04-17 DIAGNOSIS — G894 Chronic pain syndrome: Secondary | ICD-10-CM | POA: Diagnosis not present

## 2022-05-13 ENCOUNTER — Encounter: Payer: Medicare Other | Admitting: Psychology

## 2022-05-20 ENCOUNTER — Telehealth: Payer: Self-pay | Admitting: Internal Medicine

## 2022-05-20 ENCOUNTER — Encounter: Payer: Medicare Other | Admitting: Psychology

## 2022-05-20 MED ORDER — ATORVASTATIN CALCIUM 40 MG PO TABS: 40.0000 mg | ORAL_TABLET | Freq: Every day | ORAL | 1 refills | Status: DC

## 2022-05-20 NOTE — Telephone Encounter (Signed)
Pt is calling and would like a refill on atorvastatin (LIPITOR) 40 MG tablet #90 [id number] [address] Phone:  [phone] Fax:  [phone]

## 2022-05-28 ENCOUNTER — Other Ambulatory Visit: Payer: Self-pay

## 2022-05-28 ENCOUNTER — Emergency Department (HOSPITAL_BASED_OUTPATIENT_CLINIC_OR_DEPARTMENT_OTHER): Payer: Medicare Other | Admitting: Radiology

## 2022-05-28 ENCOUNTER — Emergency Department (HOSPITAL_BASED_OUTPATIENT_CLINIC_OR_DEPARTMENT_OTHER): Payer: Medicare Other

## 2022-05-28 ENCOUNTER — Emergency Department (HOSPITAL_BASED_OUTPATIENT_CLINIC_OR_DEPARTMENT_OTHER)
Admission: EM | Admit: 2022-05-28 | Discharge: 2022-05-28 | Disposition: A | Discharge status: Home / Self Care | Payer: Medicare Other | Attending: Emergency Medicine | Admitting: Emergency Medicine

## 2022-05-28 ENCOUNTER — Encounter (HOSPITAL_BASED_OUTPATIENT_CLINIC_OR_DEPARTMENT_OTHER): Payer: Self-pay

## 2022-05-28 DIAGNOSIS — Z79899 Other long term (current) drug therapy: Secondary | ICD-10-CM | POA: Diagnosis not present

## 2022-05-28 DIAGNOSIS — I10 Essential (primary) hypertension: Secondary | ICD-10-CM | POA: Diagnosis not present

## 2022-05-28 DIAGNOSIS — J9811 Atelectasis: Secondary | ICD-10-CM | POA: Diagnosis not present

## 2022-05-28 DIAGNOSIS — R7989 Other specified abnormal findings of blood chemistry: Secondary | ICD-10-CM | POA: Diagnosis not present

## 2022-05-28 DIAGNOSIS — W19XXXA Unspecified fall, initial encounter: Secondary | ICD-10-CM | POA: Diagnosis not present

## 2022-05-28 DIAGNOSIS — S20302A Unspecified superficial injuries of left front wall of thorax, initial encounter: Secondary | ICD-10-CM | POA: Diagnosis present

## 2022-05-28 DIAGNOSIS — E119 Type 2 diabetes mellitus without complications: Secondary | ICD-10-CM | POA: Insufficient documentation

## 2022-05-28 DIAGNOSIS — Z7982 Long term (current) use of aspirin: Secondary | ICD-10-CM | POA: Diagnosis not present

## 2022-05-28 DIAGNOSIS — I6782 Cerebral ischemia: Secondary | ICD-10-CM | POA: Diagnosis not present

## 2022-05-28 DIAGNOSIS — S20212A Contusion of left front wall of thorax, initial encounter: Secondary | ICD-10-CM | POA: Insufficient documentation

## 2022-05-28 DIAGNOSIS — R079 Chest pain, unspecified: Secondary | ICD-10-CM | POA: Diagnosis not present

## 2022-05-28 LAB — CBC WITH DIFFERENTIAL/PLATELET
Abs Immature Granulocytes: 0.05 10*3/uL (ref 0.00–0.07)
Basophils Absolute: 0 10*3/uL (ref 0.0–0.1)
Basophils Relative: 0 %
Eosinophils Absolute: 0 10*3/uL (ref 0.0–0.5)
Eosinophils Relative: 1 %
HCT: 39.3 % (ref 36.0–46.0)
Hemoglobin: 12.7 g/dL (ref 12.0–15.0)
Immature Granulocytes: 1 %
Lymphocytes Relative: 13 %
Lymphs Abs: 0.9 10*3/uL (ref 0.7–4.0)
MCH: 27.9 pg (ref 26.0–34.0)
MCHC: 32.3 g/dL (ref 30.0–36.0)
MCV: 86.2 fL (ref 80.0–100.0)
Monocytes Absolute: 0.4 10*3/uL (ref 0.1–1.0)
Monocytes Relative: 6 %
Neutro Abs: 5.1 10*3/uL (ref 1.7–7.7)
Neutrophils Relative %: 79 %
Platelets: 181 10*3/uL (ref 150–400)
RBC: 4.56 MIL/uL (ref 3.87–5.11)
RDW: 15 % (ref 11.5–15.5)
WBC: 6.4 10*3/uL (ref 4.0–10.5)
nRBC: 0 % (ref 0.0–0.2)

## 2022-05-28 LAB — BASIC METABOLIC PANEL
Anion gap: 12 (ref 5–15)
BUN: 22 mg/dL (ref 8–23)
CO2: 24 mmol/L (ref 22–32)
Calcium: 9.3 mg/dL (ref 8.9–10.3)
Chloride: 97 mmol/L — ABNORMAL LOW (ref 98–111)
Creatinine, Ser: 1.67 mg/dL — ABNORMAL HIGH (ref 0.44–1.00)
GFR, Estimated: 30 mL/min — ABNORMAL LOW (ref >60)
Glucose, Bld: 116 mg/dL — ABNORMAL HIGH (ref 70–99)
Potassium: 3.9 mmol/L (ref 3.5–5.1)
Sodium: 133 mmol/L — ABNORMAL LOW (ref 135–145)

## 2022-05-28 LAB — TROPONIN I (HIGH SENSITIVITY): Troponin I (High Sensitivity): 5 ng/L (ref <18)

## 2022-05-28 MED ORDER — IOHEXOL 350 MG/ML SOLN
100.0000 mL | Freq: Once | INTRAVENOUS | Status: AC | PRN
  Administered 2022-05-28: 60 mL via INTRAVENOUS

## 2022-05-28 MED ORDER — FENTANYL CITRATE PF 50 MCG/ML IJ SOSY
25.0000 ug | PREFILLED_SYRINGE | Freq: Once | INTRAMUSCULAR | Status: AC
  Administered 2022-05-28: 25 ug via INTRAVENOUS
  Filled 2022-05-28: qty 1

## 2022-05-28 NOTE — Discharge Instructions (Signed)
Your testing is negative for fracture.  Follow-up with your doctor regarding your fall and be evaluated for episode of passing out.  He may require additional testing including echocardiogram of your heart, ultrasound of your blood vessels in your neck and wearing a heart rhythm monitor at home.  Return to the ED with chest pain, shortness of breath, dizziness, lightheadedness or any other concerns.

## 2022-05-28 NOTE — ED Provider Notes (Signed)
[facility] EMERGENCY DEPT Provider Note   CSN: [id number] Arrival date & time: 05/28/22  1741     History  Chief Complaint  Patient presents with   Tammy [name] is a 81 y.o. female.  Patient with a history of hypertension and diabetes and high cholesterol presenting with left-sided rib pain.  Believes she fell last night but does not really recall.  Daughter was not there.  States she remembers getting water at the refrigerator and the next thing she knows she was on the ground with left-sided rib pain.  Does not remember feeling dizzy or lightheaded.  Does not remember tripping or stumbling or falling.  Is not sure if she slipped on water.  Does not think she hit her head.  Has been having severe pain to her left ribs ever since.  Has pain with deep breathing.  No neck pain.  No blood thinner use other than aspirin.  Denies any abdominal pain, nausea or vomiting.  No focal weakness, numbness or tingling. Daughter reports history of short-term memory issues and may have some difficulty remembering falling. Patient does not believe she slipped or stumbled.   The history is provided by the patient and a relative.  Fall Associated symptoms include headaches. Pertinent negatives include no chest pain, no abdominal pain and no shortness of breath.       Home Medications Prior to Admission medications   Medication Sig Start Date End Date Taking? Authorizing Provider  amLODipine-valsartan (EXFORGE) 5-160 MG tablet Take 1 tablet by mouth daily. Patient taking differently: Take 1 tablet by mouth daily. In the morning 11/26/21   Pixie Casino, MD  aspirin EC 81 MG tablet Take 1 tablet (81 mg total) by mouth daily. Swallow whole. 03/13/22   Isaac Bliss, Rayford Halsted, MD  atorvastatin (LIPITOR) 40 MG tablet Take 1 tablet (40 mg total) by mouth at bedtime. 05/20/22   Isaac Bliss, Rayford Halsted, MD  bisacodyl (DULCOLAX) 5 MG EC tablet Take 5 mg by mouth in the  morning and at bedtime.    [provider]  cholecalciferol (VITAMIN D3) 25 MCG (1000 UNIT) tablet Take 1,000 Units by mouth daily.    [provider]  cyanocobalamin (,VITAMIN B-12,) 1000 MCG/ML injection inject 1 ml once a month 03/13/22   Isaac Bliss, Rayford Halsted, MD  cyclobenzaprine (FLEXERIL) 10 MG tablet Take 10 mg by mouth 3 (three) times daily as needed. 11/06/21   [provider]  gabapentin (NEURONTIN) 100 MG capsule at bedtime.    [provider]  Iron, Ferrous Sulfate, 325 (65 Fe) MG TABS Take 325 mg by mouth 2 (two) times daily. Patient taking differently: Take 325 mg by mouth daily. 09/11/21   Isaac Bliss, Rayford Halsted, MD  Multiple Vitamins-Minerals (MULTI-VITAMIN GUMMIES PO) Take by mouth.    [provider]  oxyCODONE (OXY IR/ROXICODONE) 5 MG immediate release tablet Take 1-2 tablets (5-10 mg total) by mouth every 3 (three) hours as needed for breakthrough pain. Patient taking differently: Take 10 mg by mouth every 3 (three) hours as needed for breakthrough pain. 01/31/14   Porterfield, Amber, PA-C  oxymetazoline (AFRIN) 0.05 % nasal spray Place 1 spray into both nostrils 2 (two) times daily.    [provider]  pantoprazole (PROTONIX) 40 MG tablet Take 1 tablet (40 mg total) by mouth 2 (two) times daily. 12/18/21   Isaac Bliss, Rayford Halsted, MD  SYRINGE-NEEDLE, DISP, 3 ML (BD SAFETYGLIDE SYRINGE/NEEDLE) 25G X 1" 3  ML MISC Use to inject b12 03/13/22   Isaac Bliss, Rayford Halsted, MD  traMADol (ULTRAM) 50 MG tablet Take 1-2 tablets (50-100 mg total) by mouth every 6 (six) hours as needed for moderate pain. Patient taking differently: Take 50-100 mg by mouth at bedtime as needed for moderate pain. 01/31/14   Porterfield, Amber, PA-C  Valerian 450 MG CAPS Take by mouth.    [provider]      Allergies    Hydrocodone and Penicillins    Review of Systems   Review of Systems  Constitutional:  Negative for activity change,  appetite change and fever.  HENT:  Negative for congestion and hearing loss.   Respiratory:  Negative for shortness of breath.   Cardiovascular:  Negative for chest pain.  Gastrointestinal:  Negative for abdominal pain, nausea and vomiting.  Musculoskeletal:  Positive for arthralgias and myalgias.  Neurological:  Positive for headaches. Negative for weakness.   all other systems are negative except as noted in the HPI and PMH.    Physical Exam Updated Vital Signs BP 105/85 (BP Location: Right Arm)   Pulse 74   Temp 98.3 F (36.8 C) (Oral)   Resp 18   Ht 5' (1.524 m)   Wt 81.6 kg   SpO2 90%   BMI 35.15 kg/m  Physical Exam Vitals and nursing note reviewed.  Constitutional:      General: She is not in acute distress.    Appearance: She is well-developed. She is not ill-appearing.  HENT:     Head: Normocephalic and atraumatic.     Mouth/Throat:     Pharynx: No oropharyngeal exudate.  Eyes:     Conjunctiva/sclera: Conjunctivae normal.     Pupils: Pupils are equal, round, and reactive to light.  Neck:     Comments: No meningismus. Cardiovascular:     Rate and Rhythm: Normal rate and regular rhythm.     Heart sounds: Normal heart sounds. No murmur heard. Pulmonary:     Effort: Pulmonary effort is normal. No respiratory distress.     Breath sounds: Normal breath sounds.     Comments: Tenderness left lateral ribs, no crepitus or ecchymosis. Chest:     Chest wall: Tenderness present.  Abdominal:     Palpations: Abdomen is soft.     Tenderness: There is no abdominal tenderness. There is no guarding or rebound.  Musculoskeletal:        General: No tenderness. Normal range of motion.     Cervical back: Normal range of motion and neck supple.  Skin:    General: Skin is warm.  Neurological:     Mental Status: She is alert and oriented to person, place, and time.     Cranial Nerves: No cranial nerve deficit.     Motor: No abnormal muscle tone.     Coordination: Coordination  normal.     Comments:  5/5 strength throughout. CN 2-12 intact.Equal grip strength.   Psychiatric:        Behavior: Behavior normal.    ED Results / Procedures / Treatments   Labs (all labs ordered are listed, but only abnormal results are displayed) Labs Reviewed  BASIC METABOLIC PANEL - Abnormal; Notable for the following components:      Result Value   Sodium 133 (*)    Chloride 97 (*)    Glucose, Bld 116 (*)    Creatinine, Ser 1.67 (*)    GFR, Estimated 30 (*)    All other components within normal  limits  CBC WITH DIFFERENTIAL/PLATELET  TROPONIN I (HIGH SENSITIVITY)  TROPONIN I (HIGH SENSITIVITY)    EKG None  Radiology CT Head Wo Contrast  Result Date: 05/28/2022 CLINICAL DATA:  Fall 2 nights ago EXAM: CT HEAD WITHOUT CONTRAST TECHNIQUE: Contiguous axial images were obtained from the base of the skull through the vertex without intravenous contrast. RADIATION DOSE REDUCTION: This exam was performed according to the departmental dose-optimization program which includes automated exposure control, adjustment of the mA and/or kV according to patient size and/or use of iterative reconstruction technique. COMPARISON:  MRI brain dated 10/10/2021 FINDINGS: Brain: No evidence of acute infarction, hemorrhage, hydrocephalus, extra-axial collection or mass lesion/mass effect. Subcortical white matter and periventricular small vessel ischemic changes. Vascular: No hyperdense vessel or unexpected calcification. Skull: Normal. Negative for fracture or focal lesion. Sinuses/Orbits: The visualized paranasal sinuses are essentially clear. The mastoid air cells are unopacified. Other: None. IMPRESSION: No evidence of acute intracranial abnormality. Small vessel ischemic changes. Electronically Signed   By: Julian Hy M.D.   On: 05/28/2022 23:01   CT Angio Chest PE W and/or Wo Contrast  Result Date: 05/28/2022 CLINICAL DATA:  Fall, left rib pain, positive D-dimer EXAM: CT ANGIOGRAPHY CHEST  WITH CONTRAST TECHNIQUE: Multidetector CT imaging of the chest was performed using the standard protocol during bolus administration of intravenous contrast. Multiplanar CT image reconstructions and MIPs were obtained to evaluate the vascular anatomy. RADIATION DOSE REDUCTION: This exam was performed according to the departmental dose-optimization program which includes automated exposure control, adjustment of the mA and/or kV according to patient size and/or use of iterative reconstruction technique. CONTRAST:  53m OMNIPAQUE IOHEXOL 350 MG/ML SOLN COMPARISON:  Chest/rib radiographs dated 06/07/2022 FINDINGS: Cardiovascular: Satisfactory opacification the bilateral pulmonary arteries to the segmental level. No evidence of pulmonary embolism. No evidence of thoracic aortic aneurysm. Atherosclerotic calcifications of the aortic arch. The heart is top-normal in size.  No pericardial effusion. Mild coronary sclerosis of the LAD and left circumflex. Mediastinum/Nodes: No suspicious mediastinal lymphadenopathy. Visualized thyroid is unremarkable. Lungs/Pleura: Dependent atelectasis in the bilateral upper and lower lobes. Superimposed patchy opacities in the bilateral lower lobes, likely atelectasis. Small bilateral pleural effusions. No suspicious pulmonary nodules. No pneumothorax. Upper Abdomen: Visualized upper abdomen is notable for a small hiatal hernia. Musculoskeletal: Visualized osseous structures are within normal limits. No rib fracture is seen. Review of the MIP images confirms the above findings. IMPRESSION: No evidence of pulmonary embolism. No rib fracture is seen. Multifocal atelectasis with small bilateral pleural effusions. Aortic Atherosclerosis (ICD10-I70.0). Electronically Signed   By: SJulian HyM.D.   On: 05/28/2022 23:00   DG Ribs Unilateral W/Chest Left  Result Date: 05/28/2022 CLINICAL DATA:  Fall 2 nights ago with left-sided chest pain, initial encounter EXAM: LEFT RIBS AND CHEST -  3+ VIEW COMPARISON:  None Available. FINDINGS: Cardiac shadow is within normal limits. Aortic calcifications are seen. The lungs are hypoinflated with mild left basilar atelectasis. No pneumothorax is seen. No definitive rib fracture is seen. IMPRESSION: No rib fracture noted. Mild left basilar atelectasis is seen. Electronically Signed   By: MInez CatalinaM.D.   On: 05/28/2022 19:04    Procedures Procedures    Medications Ordered in ED Medications - No data to display  ED Course/ Medical Decision Making/ A&P                           Medical Decision Making Amount and/or Complexity of Data Reviewed Labs: ordered.  Decision-making details documented in ED Course. Radiology: ordered and independent interpretation performed. Decision-making details documented in ED Course. ECG/medicine tests: ordered and independent interpretation performed. Decision-making details documented in ED Course.  Risk Prescription drug management.  Left-sided rib pain after falling last night.  Does not recall details of fall.  No dizziness or lightheadedness.  Does not believe she slipped or stumbled.  X-ray in triage results reviewed interpreted by me.  No rib fracture.  No pneumothorax  EKG shows no Brugada, no prolonged QT.  Labs are reassuring with slight elevation of her creatinine.  Given that patient does not recall falling does not know exactly what happened to her, CT head is obtained.  This is negative for acute traumatic injury.  Results reviewed and interpreted by me.  No rib fracture or pneumothorax seen.  Daughter at bedside states patient does have some memory issues so is not unusual for her not to remember falling.  Suspect this is likely mechanical as she was getting water from the refrigerator and believes that she stumbled on her socks.  Discussed we cannot rule out syncopal episode but she does not want to be admitted tonight.   Message sent to her PCP Dr. Jerilee Hoh.  Patient may benefit  from syncope work-up as an outpatient including carotid Dopplers and echocardiogram.  Patient and daughter are comfortable with discharge home tonight.  She has pain medication at home which she takes for her back. Incentive spirometer given.  Return to the ED with difficulty breathing, chest pain or any other concerns.       Final Clinical Impression(s) / ED Diagnoses Final diagnoses:  Contusion of rib on left side, initial encounter  Fall, initial encounter    Rx / DC Orders ED Discharge Orders     None         Jamirah Zelaya, Annie Main, MD 05/28/22 2350

## 2022-05-28 NOTE — ED Triage Notes (Signed)
Pt states she fell 2 nights ago. Pt unsure how she went down. Pt c/o left rib pain. Pt took home pain meds at the time.

## 2022-05-29 ENCOUNTER — Ambulatory Visit: Payer: Medicare Other | Admitting: Adult Health

## 2022-06-17 ENCOUNTER — Ambulatory Visit: Payer: Medicare Other | Admitting: Internal Medicine

## 2022-06-24 ENCOUNTER — Telehealth: Payer: Self-pay | Admitting: Internal Medicine

## 2022-06-24 NOTE — Telephone Encounter (Signed)
Phone number to medical records given to the patient.

## 2022-06-24 NOTE — Telephone Encounter (Signed)
Pt is aware md will have access to her ED records. Pt has an appt

## 2022-06-24 NOTE — Telephone Encounter (Signed)
Pt went to the ED on 05/28/22 and is not sure how she can access that visit information and wants to know if MD can get her a copy of her MRI and a copy of her labs, so that she can give it to her children?   Pt was told that MD would be asked on her behalf, but she could probably just call the ED and ask them, as well.  Please advise.

## 2022-06-26 ENCOUNTER — Encounter: Payer: Self-pay | Admitting: Internal Medicine

## 2022-06-26 ENCOUNTER — Ambulatory Visit (INDEPENDENT_AMBULATORY_CARE_PROVIDER_SITE_OTHER): Payer: Medicare Other | Admitting: Internal Medicine

## 2022-06-26 VITALS — BP 129/79 | HR 100 | Temp 98.8°F | Wt 179.5 lb

## 2022-06-26 DIAGNOSIS — N1831 Chronic kidney disease, stage 3a: Secondary | ICD-10-CM | POA: Diagnosis not present

## 2022-06-26 DIAGNOSIS — R55 Syncope and collapse: Secondary | ICD-10-CM | POA: Diagnosis not present

## 2022-06-26 DIAGNOSIS — Z09 Encounter for follow-up examination after completed treatment for conditions other than malignant neoplasm: Secondary | ICD-10-CM

## 2022-06-26 LAB — BASIC METABOLIC PANEL
BUN: 12 mg/dL (ref 6–23)
CO2: 27 mEq/L (ref 19–32)
Calcium: 8.9 mg/dL (ref 8.4–10.5)
Chloride: 105 mEq/L (ref 96–112)
Creatinine, Ser: 1.15 mg/dL (ref 0.40–1.20)
GFR: 44.44 mL/min — ABNORMAL LOW (ref >60.00)
Glucose, Bld: 105 mg/dL — ABNORMAL HIGH (ref 70–99)
Potassium: 3.9 mEq/L (ref 3.5–5.1)
Sodium: 140 mEq/L (ref 135–145)

## 2022-06-26 NOTE — Progress Notes (Signed)
Established Patient Office Visit     CC/Reason for Visit: Emergency department follow-up  HPI: Tammy [name] is a 81 y.o. female who is coming in today for the above mentioned reasons. Past Medical History is significant for: Iron deficiency anemia and B12 deficiency, hypertension, GERD, impaired glucose tolerance, morbid obesity, hyperlipidemia, chronic kidney disease stage III.  She was seen in the emergency department on October 4 after a fall resulted in a left chest wall contusion.  She had chest x-rays that were negative for rib fracture, she also had a CT scan of the head and a CT angiogram of the chest that were normal.  Because this was thought to be a syncopal episode I was asked to arrange for her to have 2D echo and carotid Dopplers on follow-up as she refused admission for these.  Past Medical/Surgical History: Past Medical History:  Diagnosis Date   Anxiety    Arthritis    knees and back   Bruised rib 01/18/2014   x-rayed   Chicken pox    DDD (degenerative disc disease), lumbar    Depression    mild, never on medication   Deviated septum    GERD (gastroesophageal reflux disease)    Heart murmur    told in 2012 benign   Hemorrhoids    High cholesterol    borderline, no meds   Hyperglycemia    borderline   Hypertension    high blood pressure readings, no meds   Osteoarthritis    Pre-diabetes    diet- controlled   Rib pain on left side 01/20/2014    xray done seen by MD 01/20/14 in [facility]    Scoliosis    Varicose vein     Past Surgical History:  Procedure Laterality Date   ABDOMINAL HYSTERECTOMY  1972   COLONOSCOPY  05/27/2013   DENTAL SURGERY  2007   left jaw bone graft, 2 implants on left, 2 implants on right   HEMORRHOID BANDING  06/03/13   [place] BREAST ABSCESS Bilateral 1966, 1967   MASS EXCISION N/A 04/22/2013   Procedure: EXCISION MULTPILE soft tissue masses and abnormal skin lesion left thigh /left  chest/ left posterior shoulder/ right flank ;  Surgeon: Harl Bowie, MD;  Location: [facility];  Service: General;  Laterality: N/A;  multiple sites:  left thigh, left shoulder, right flank, right upper arm   OVARIAN CYST SURGERY Bilateral 1991   OVARIAN CYST SURGERY     SEPTOPLASTY Bilateral 12/16/2016   Procedure: SEPTOPLASTY;  Surgeon: Leta Baptist, MD;  Location: [facility];  Service: ENT;  Laterality: Bilateral;   [place] Right 07/25/2013   Procedure: RIGHT TOTAL KNEE ARTHROPLASTY;  Surgeon: Gearlean Alf, MD;  Location: [facility];  Service: Orthopedics;  Laterality: Right;  Left knee cortisone injection   TOTAL KNEE ARTHROPLASTY Left 01/30/2014   Procedure: LEFT TOTAL KNEE ARTHROPLASTY;  Surgeon: Gearlean Alf, MD;  Location: [facility];  Service: Orthopedics;  Laterality: Left;   TUBAL LIGATION  1970    Social History:  reports that she has never smoked. She has never used smokeless tobacco. She reports current alcohol use. She reports that she does not use drugs.  Allergies: Allergies  Allergen Reactions   Hydrocodone     Made her feel very strange, wired, restless  Made her feel very strange, wired, restless   Penicillins Rash  Family History:  Family History  Problem Relation Age of Onset   Alcohol abuse Father    Heart disease Father    Arthritis Mother    Stroke Mother    Hypertension Mother    Diabetes Mother    Breast cancer Maternal Grandmother    Breast cancer Maternal Aunt    Colon cancer Maternal Uncle    Esophageal cancer Neg Hx    Stomach cancer Neg Hx    Rectal cancer Neg Hx      Current Outpatient Medications:    amLODipine-valsartan (EXFORGE) 5-160 MG tablet, Take 1 tablet by mouth daily. (Patient taking differently: Take 1 tablet by mouth daily. In the morning), Disp: 30 tablet, Rfl: 11   aspirin EC 81 MG tablet, Take 1 tablet (81 mg total) by mouth daily. Swallow whole., Disp: 30  tablet, Rfl: 12   atorvastatin (LIPITOR) 40 MG tablet, Take 1 tablet (40 mg total) by mouth at bedtime., Disp: 90 tablet, Rfl: 1   bisacodyl (DULCOLAX) 5 MG EC tablet, Take 5 mg by mouth in the morning and at bedtime., Disp: , Rfl:    cyanocobalamin (,VITAMIN B-12,) 1000 MCG/ML injection, inject 1 ml once a month, Disp: 6 mL, Rfl: 11   cyclobenzaprine (FLEXERIL) 10 MG tablet, Take 10 mg by mouth 3 (three) times daily as needed., Disp: , Rfl:    gabapentin (NEURONTIN) 100 MG capsule, at bedtime., Disp: , Rfl:    Iron, Ferrous Sulfate, 325 (65 Fe) MG TABS, Take 325 mg by mouth 2 (two) times daily. (Patient taking differently: Take 325 mg by mouth daily.), Disp: 30 tablet, Rfl:    Multiple Vitamins-Minerals (MULTI-VITAMIN GUMMIES PO), Take by mouth., Disp: , Rfl:    Multiple Vitamins-Minerals (VITRUM 50+ SENIOR MULTI PO), Take by mouth., Disp: , Rfl:    oxyCODONE (OXY IR/ROXICODONE) 5 MG immediate release tablet, Take 1-2 tablets (5-10 mg total) by mouth every 3 (three) hours as needed for breakthrough pain. (Patient taking differently: Take 10 mg by mouth in the morning.), Disp: 80 tablet, Rfl: 0   oxymetazoline (AFRIN) 0.05 % nasal spray, Place 1 spray into both nostrils 2 (two) times daily as needed., Disp: , Rfl:    pantoprazole (PROTONIX) 40 MG tablet, Take 1 tablet (40 mg total) by mouth 2 (two) times daily., Disp: 180 tablet, Rfl: 1   SYRINGE-NEEDLE, DISP, 3 ML (BD SAFETYGLIDE SYRINGE/NEEDLE) 25G X 1" 3 ML MISC, Use to inject b12, Disp: 100 each, Rfl: 11   traMADol (ULTRAM) 50 MG tablet, Take 1-2 tablets (50-100 mg total) by mouth every 6 (six) hours as needed for moderate pain. (Patient taking differently: Take 50-100 mg by mouth at bedtime as needed for moderate pain.), Disp: 60 tablet, Rfl: 0   Valerian 450 MG CAPS, Take by mouth., Disp: , Rfl:   Review of Systems:  Constitutional: Denies fever, chills, diaphoresis, appetite change and fatigue.  HEENT: Denies photophobia, eye pain, redness,  hearing loss, ear pain, congestion, sore throat, rhinorrhea, sneezing, mouth sores, trouble swallowing, neck pain, neck stiffness and tinnitus.   Respiratory: Denies SOB, DOE, cough, chest tightness,  and wheezing.   Cardiovascular: Denies chest pain, palpitations and leg swelling.  Gastrointestinal: Denies nausea, vomiting, abdominal pain, diarrhea, constipation, blood in stool and abdominal distention.  Genitourinary: Denies dysuria, urgency, frequency, hematuria, flank pain and difficulty urinating.  Endocrine: Denies: hot or cold intolerance, sweats, changes in hair or nails, polyuria, polydipsia.  Skin: Denies pallor, rash and wound.  Neurological: Denies dizziness, seizures, syncope, weakness,  light-headedness, numbness and headaches.  Hematological: Denies adenopathy. Easy bruising, personal or family bleeding history     Physical Exam: Vitals:   06/26/22 1130 06/26/22 1202  BP: 130/80 129/79  Pulse: 100   Temp: 98.8 F (37.1 C)   TempSrc: Oral   SpO2: 98%   Weight: 179 lb 8 oz (81.4 kg)     Body mass index is 35.06 kg/m.   Constitutional: NAD, calm, comfortable Eyes: PERRL, lids and conjunctivae normal ENMT: Mucous membranes are moist.  Respiratory: clear to auscultation bilaterally, no wheezing, no crackles. Normal respiratory effort. No accessory muscle use.  Cardiovascular: Regular rate and rhythm, no murmurs / rubs / gallops. No extremity edema.     Impression and Plan:  Hospital discharge follow-up  Syncope, unspecified syncope type - Plan: ECHOCARDIOGRAM COMPLETE, US Carotid Duplex Bilateral  Stage 3a chronic kidney disease ([place]) - Plan: Basic metabolic panel  -ED records reviewed in detail. -Given presumed syncope, echo and carotid Dopplers will be requested today. -Check BMP to follow renal function.   Time spent:31 minutes reviewing chart, interviewing and examining patient and formulating plan of care.      Lelon Frohlich, MD [place]  [facility] at [facility]

## 2022-06-27 ENCOUNTER — Telehealth: Payer: Self-pay | Admitting: Internal Medicine

## 2022-06-27 ENCOUNTER — Telehealth: Payer: Self-pay | Admitting: Physician Assistant

## 2022-06-27 NOTE — Telephone Encounter (Signed)
[facility] V[facility] called to get a PA for the Echo Cardiogram that was ordered.   #[phone]  Please advise

## 2022-06-27 NOTE — Telephone Encounter (Signed)
Patient was seen at the ED [facility] on 05/28/22. She had some testing done after a fall that she cannot remember. She'd like to know Kennis Carina recommendations?

## 2022-06-27 NOTE — Telephone Encounter (Signed)
Patient had MRI done in 09/2021, will follow up with PCP.

## 2022-06-27 NOTE — Telephone Encounter (Signed)
Pt is calling and would like blood work result 

## 2022-06-30 NOTE — Telephone Encounter (Signed)
No prior auth req.   Case #: [id number]

## 2022-07-01 ENCOUNTER — Ambulatory Visit (HOSPITAL_BASED_OUTPATIENT_CLINIC_OR_DEPARTMENT_OTHER)
Admission: RE | Admit: 2022-07-01 | Discharge: 2022-07-01 | Disposition: A | Discharge status: Home / Self Care | Payer: Medicare Other | Source: Ambulatory Visit | Attending: Internal Medicine | Admitting: Internal Medicine

## 2022-07-01 ENCOUNTER — Other Ambulatory Visit: Payer: Self-pay | Admitting: Internal Medicine

## 2022-07-01 DIAGNOSIS — K219 Gastro-esophageal reflux disease without esophagitis: Secondary | ICD-10-CM

## 2022-07-01 DIAGNOSIS — R55 Syncope and collapse: Secondary | ICD-10-CM | POA: Diagnosis not present

## 2022-07-01 NOTE — Telephone Encounter (Signed)
Patient is aware 

## 2022-07-21 ENCOUNTER — Ambulatory Visit (INDEPENDENT_AMBULATORY_CARE_PROVIDER_SITE_OTHER): Payer: Medicare Other

## 2022-07-21 DIAGNOSIS — R55 Syncope and collapse: Secondary | ICD-10-CM | POA: Diagnosis not present

## 2022-07-22 LAB — ECHOCARDIOGRAM COMPLETE
AR max vel: 2.45 cm2
AV Area VTI: 2.46 cm2
AV Area mean vel: 2.61 cm2
AV Mean grad: 3 mmHg
AV Peak grad: 6.2 mmHg
Ao pk vel: 1.24 m/s
Area-P 1/2: 2.54 cm2
S' Lateral: 1.92 cm
Single Plane A4C EF: 80.6 %

## 2022-08-14 DIAGNOSIS — M5416 Radiculopathy, lumbar region: Secondary | ICD-10-CM | POA: Diagnosis not present

## 2022-08-14 DIAGNOSIS — G894 Chronic pain syndrome: Secondary | ICD-10-CM | POA: Diagnosis not present

## 2022-09-05 ENCOUNTER — Telehealth: Payer: Self-pay | Admitting: Internal Medicine

## 2022-09-05 NOTE — Telephone Encounter (Signed)
Called pt see wants her tests rests. Dr. Debara Pickett only reviewed one result, not both. Pt wants an appointment to see Dr. Debara Pickett, while attempting to schedule her she wants gets off the phone. "I'll call your office back I have too many things going on right now."

## 2022-09-05 NOTE — Telephone Encounter (Signed)
Patient would like a call back to discuss results of current tests.

## 2022-10-07 ENCOUNTER — Encounter: Payer: Medicare Other | Admitting: Psychology

## 2022-10-17 ENCOUNTER — Encounter: Payer: Self-pay | Admitting: Internal Medicine

## 2022-10-17 ENCOUNTER — Ambulatory Visit: Payer: Medicare Other | Attending: Internal Medicine | Admitting: Internal Medicine

## 2022-10-17 VITALS — BP 130/80 | HR 85 | Ht 60.0 in | Wt 189.0 lb

## 2022-10-17 DIAGNOSIS — N1831 Chronic kidney disease, stage 3a: Secondary | ICD-10-CM | POA: Diagnosis not present

## 2022-10-17 DIAGNOSIS — R0989 Other specified symptoms and signs involving the circulatory and respiratory systems: Secondary | ICD-10-CM

## 2022-10-17 DIAGNOSIS — I10 Essential (primary) hypertension: Secondary | ICD-10-CM | POA: Diagnosis not present

## 2022-10-17 NOTE — Patient Instructions (Signed)
Medication Instructions:  NO CHANGES  *If you need a refill on your cardiac medications before your next appointment, please call your pharmacy*   Follow-Up: At [facility], you and your health needs are our priority.  As part of our continuing mission to provide you with exceptional heart care, we have created designated Provider Care Teams.  These Care Teams include your primary Cardiologist (physician) and Advanced Practice Providers (APPs -  Physician Assistants and Nurse Practitioners) who all work together to provide you with the care you need, when you need it.  We recommend signing up for the patient portal called "MyChart".  Sign up information is provided on this After Visit Summary.  MyChart is used to connect with patients for Virtual Visits (Telemedicine).  Patients are able to view lab/test results, encounter notes, upcoming appointments, etc.  Non-urgent messages can be sent to your provider as well.   To learn more about what you can do with MyChart, go to [email or web address].    Your next appointment:   12 month(s)  Provider:   Lyman Bishop MD

## 2022-10-17 NOTE — Progress Notes (Signed)
OFFICE CONSULT NOTE  Chief Complaint:  Follow-up hypertension  Primary Care Physician: Isaac Bliss, Rayford Halsted, MD  HPI:  Tammy [name] is a 82 y.o. female who is being seen today for the evaluation of hypertension at the request of Isaac Bliss, [place]*.  This is a pleasant 82 year old female who self-referred for evaluation management of hypertension.  Recently she saw her primary care provider who had helped her manage hypertension.  She was seen in the emergency department in January with episode of epistaxis.  She had syncope during that episode however it sounds like this was primarily vasovagal in the setting of profound anemia.  She is also had some issues with memory loss and has been followed by [facility] neurology.  She tells me that she has stage IIIa chronic kidney disease.  Recently she was seen by her primary care provider noted that she was markedly hypertensive.  She was started on Diovan HCTZ 160/25 mg daily.  She reports compliance with the medication and is actually had very good blood pressure control.  Blood pressure today was 124/82 which is ideal.  She has not had lab work scheduled to follow-up on her renal function.  Her last creatinine was 1.06 in January, but given her age equates to a GFR of 49.  10/17/2022  Ms. Gallardo returns today for follow-up.  She had more questions regarding an echocardiogram ordered by her PCP.  She did forward that result to me for my interpretation as well.  She was told that the study was okay, but more specifically the echo showed normal LVEF, moderate LVH with no significant valve disease.  I gave her a copy of those results today and explained to her in the office.  Blood pressure does appear to be much better controlled now 130/80.  She has not had any further syncopal events.  Carotid Doppler showed mild bifurcation plaque bilaterally but no significant stenosis.  PMHx:  Past Medical History:  Diagnosis Date   Anxiety     Arthritis    knees and back   Bruised rib 01/18/2014   x-rayed   Chicken pox    DDD (degenerative disc disease), lumbar    Depression    mild, never on medication   Deviated septum    GERD (gastroesophageal reflux disease)    Heart murmur    told in 2012 benign   Hemorrhoids    High cholesterol    borderline, no meds   Hyperglycemia    borderline   Hypertension    high blood pressure readings, no meds   Osteoarthritis    Pre-diabetes    diet- controlled   Rib pain on left side 01/20/2014    xray done seen by MD 01/20/14 in [facility]    Scoliosis    Varicose vein     Past Surgical History:  Procedure Laterality Date   ABDOMINAL HYSTERECTOMY  1972   COLONOSCOPY  05/27/2013   DENTAL SURGERY  2007   left jaw bone graft, 2 implants on left, 2 implants on right   HEMORRHOID BANDING  06/03/13   [place] BREAST ABSCESS Bilateral 1966, 1967   MASS EXCISION N/A 04/22/2013   Procedure: EXCISION MULTPILE soft tissue masses and abnormal skin lesion left thigh /left chest/ left posterior shoulder/ right flank ;  Surgeon: Harl Bowie, MD;  Location: [facility];  Service: General;  Laterality: N/A;  multiple sites:  left thigh, left shoulder, right flank, right upper arm  OVARIAN CYST SURGERY Bilateral 1991   OVARIAN CYST SURGERY     SEPTOPLASTY Bilateral 12/16/2016   Procedure: SEPTOPLASTY;  Surgeon: Leta Baptist, MD;  Location: [place];  Service: ENT;  Laterality: Bilateral;   [place] Right 07/25/2013   Procedure: RIGHT TOTAL KNEE ARTHROPLASTY;  Surgeon: Gearlean Alf, MD;  Location: [facility];  Service: Orthopedics;  Laterality: Right;  Left knee cortisone injection   TOTAL KNEE ARTHROPLASTY Left 01/30/2014   Procedure: LEFT TOTAL KNEE ARTHROPLASTY;  Surgeon: Gearlean Alf, MD;  Location: [facility];  Service: Orthopedics;  Laterality: Left;   TUBAL LIGATION  1970    FAMHx:  Family  History  Problem Relation Age of Onset   Alcohol abuse Father    Heart disease Father    Arthritis Mother    Stroke Mother    Hypertension Mother    Diabetes Mother    Breast cancer Maternal Grandmother    Breast cancer Maternal Aunt    Colon cancer Maternal Uncle    Esophageal cancer Neg Hx    Stomach cancer Neg Hx    Rectal cancer Neg Hx     SOCHx:   reports that she has never smoked. She has never used smokeless tobacco. She reports current alcohol use. She reports that she does not use drugs.  ALLERGIES:  Allergies  Allergen Reactions   Hydrocodone     Made her feel very strange, wired, restless  Made her feel very strange, wired, restless   Penicillins Rash    ROS: Pertinent items noted in HPI and remainder of comprehensive ROS otherwise negative.  HOME MEDS: Current Outpatient Medications on File Prior to Visit  Medication Sig Dispense Refill   amLODipine-valsartan (EXFORGE) 5-160 MG tablet Take 1 tablet by mouth daily. (Patient taking differently: Take 1 tablet by mouth daily. In the morning) 30 tablet 11   aspirin EC 81 MG tablet Take 1 tablet (81 mg total) by mouth daily. Swallow whole. 30 tablet 12   atorvastatin (LIPITOR) 40 MG tablet Take 1 tablet (40 mg total) by mouth at bedtime. 90 tablet 1   bisacodyl (DULCOLAX) 5 MG EC tablet Take 5 mg by mouth in the morning and at bedtime.     cyanocobalamin (,VITAMIN B-12,) 1000 MCG/ML injection inject 1 ml once a month 6 mL 11   cyclobenzaprine (FLEXERIL) 10 MG tablet Take 10 mg by mouth 3 (three) times daily as needed.     gabapentin (NEURONTIN) 100 MG capsule at bedtime.     Iron, Ferrous Sulfate, 325 (65 Fe) MG TABS Take 325 mg by mouth 2 (two) times daily. (Patient taking differently: Take 325 mg by mouth daily.) 30 tablet    Multiple Vitamins-Minerals (MULTI-VITAMIN GUMMIES PO) Take by mouth.     Multiple Vitamins-Minerals (VITRUM 50+ SENIOR MULTI PO) Take by mouth.     oxyCODONE (OXY IR/ROXICODONE) 5 MG  immediate release tablet Take 1-2 tablets (5-10 mg total) by mouth every 3 (three) hours as needed for breakthrough pain. (Patient taking differently: Take 10 mg by mouth in the morning.) 80 tablet 0   oxymetazoline (AFRIN) 0.05 % nasal spray Place 1 spray into both nostrils 2 (two) times daily as needed.     pantoprazole (PROTONIX) 40 MG tablet TAKE 1 TABLET BY MOUTH TWICE A DAY 180 tablet 1   SYRINGE-NEEDLE, DISP, 3 ML (BD SAFETYGLIDE SYRINGE/NEEDLE) 25G X 1" 3 ML MISC Use to inject b12 100 each 11  traMADol (ULTRAM) 50 MG tablet Take 1-2 tablets (50-100 mg total) by mouth every 6 (six) hours as needed for moderate pain. (Patient taking differently: Take 50-100 mg by mouth at bedtime as needed for moderate pain.) 60 tablet 0   Valerian 450 MG CAPS Take by mouth.     No current facility-administered medications on file prior to visit.    LABS/IMAGING: No results found for this or any previous visit (from the past 48 hour(s)). No results found.  LIPID PANEL:    Component Value Date/Time   CHOL 148 03/13/2022 1346   TRIG 146.0 03/13/2022 1346   HDL 39.70 03/13/2022 1346   CHOLHDL 4 03/13/2022 1346   VLDL 29.2 03/13/2022 1346   LDLCALC 79 03/13/2022 1346   LDLDIRECT 132.8 08/01/2014 1141    WEIGHTS: Wt Readings from Last 3 Encounters:  10/17/22 189 lb (85.7 kg)  06/26/22 179 lb 8 oz (81.4 kg)  05/28/22 180 lb (81.6 kg)    VITALS: BP 130/80   Pulse 85   Ht 5' (1.524 m)   Wt 189 lb (85.7 kg)   BMI 36.91 kg/m   EXAM: General appearance: alert, no distress, and moderately obese Neck: no carotid bruit, no JVD, and thyroid not enlarged, symmetric, no tenderness/mass/nodules Lungs: clear to auscultation bilaterally Heart: regular rate and rhythm, S1, S2 normal, no murmur, click, rub or gallop Abdomen: soft, non-tender; bowel sounds normal; no masses,  no organomegaly Extremities: extremities normal, atraumatic, no cyanosis or edema Pulses: 2+ and symmetric Skin: Skin color,  texture, turgor normal. No rashes or lesions Neurologic: Grossly normal Psych: Pleasant  EKG: Deferred  ASSESSMENT: Hypertension Syncope-likely vasovagal History of acute blood loss anemia/epistaxis CKD 3A  PLAN: 1.   Ms. Starkel seems to be doing well without recurrent syncope.  She started to do some more exercise.  Blood pressure is well-controlled now.  Echo showed some changes consistent with hypertensive heart disease.  I would recommend no further changes at this time.  Plan follow-up with [place] annually or sooner as necessary.  Pixie Casino, MD, [facility], [place] Director of the [facility] Lipid Disorders &  Cardiovascular Risk Reduction Clinic Diplomate of the American Board of Clinical Lipidology Attending Cardiologist  Direct Dial: [phone]  Fax: [phone]  Website:  [email or web address] 10/17/2022, 9:42 AM

## 2022-10-27 ENCOUNTER — Other Ambulatory Visit: Payer: Self-pay | Admitting: Internal Medicine

## 2022-11-27 DIAGNOSIS — E119 Type 2 diabetes mellitus without complications: Secondary | ICD-10-CM | POA: Diagnosis not present

## 2022-11-27 LAB — HM DIABETES EYE EXAM

## 2022-12-03 ENCOUNTER — Other Ambulatory Visit: Payer: Self-pay | Admitting: Internal Medicine

## 2022-12-03 DIAGNOSIS — K219 Gastro-esophageal reflux disease without esophagitis: Secondary | ICD-10-CM

## 2022-12-12 ENCOUNTER — Other Ambulatory Visit: Payer: Self-pay | Admitting: Internal Medicine

## 2022-12-15 ENCOUNTER — Ambulatory Visit: Payer: Medicare Other | Admitting: Internal Medicine

## 2022-12-16 DIAGNOSIS — M545 Low back pain, unspecified: Secondary | ICD-10-CM | POA: Diagnosis not present

## 2022-12-16 DIAGNOSIS — G894 Chronic pain syndrome: Secondary | ICD-10-CM | POA: Diagnosis not present

## 2022-12-16 DIAGNOSIS — M5416 Radiculopathy, lumbar region: Secondary | ICD-10-CM | POA: Diagnosis not present

## 2023-01-06 ENCOUNTER — Ambulatory Visit (INDEPENDENT_AMBULATORY_CARE_PROVIDER_SITE_OTHER): Payer: Medicare Other | Admitting: Internal Medicine

## 2023-01-06 ENCOUNTER — Encounter: Payer: Self-pay | Admitting: Internal Medicine

## 2023-01-06 VITALS — BP 130/78 | HR 79 | Temp 98.4°F | Wt 187.3 lb

## 2023-01-06 DIAGNOSIS — I10 Essential (primary) hypertension: Secondary | ICD-10-CM

## 2023-01-06 DIAGNOSIS — K219 Gastro-esophageal reflux disease without esophagitis: Secondary | ICD-10-CM | POA: Diagnosis not present

## 2023-01-06 DIAGNOSIS — E785 Hyperlipidemia, unspecified: Secondary | ICD-10-CM | POA: Diagnosis not present

## 2023-01-06 DIAGNOSIS — E669 Obesity, unspecified: Secondary | ICD-10-CM

## 2023-01-06 DIAGNOSIS — R7303 Prediabetes: Secondary | ICD-10-CM | POA: Diagnosis not present

## 2023-01-06 DIAGNOSIS — E538 Deficiency of other specified B group vitamins: Secondary | ICD-10-CM | POA: Diagnosis not present

## 2023-01-06 DIAGNOSIS — N1831 Chronic kidney disease, stage 3a: Secondary | ICD-10-CM

## 2023-01-06 DIAGNOSIS — Z6836 Body mass index (BMI) 36.0-36.9, adult: Secondary | ICD-10-CM

## 2023-01-06 LAB — POCT GLYCOSYLATED HEMOGLOBIN (HGB A1C): Hemoglobin A1C: 5.5 % (ref 4.0–5.6)

## 2023-01-06 MED ORDER — "BD SAFETYGLIDE SYRINGE/NEEDLE 25G X 1"" 3 ML MISC": 11 refills | Status: AC

## 2023-01-06 MED ORDER — CYANOCOBALAMIN 1000 MCG/ML IJ SOLN: INTRAMUSCULAR | 11 refills | Status: DC

## 2023-01-06 NOTE — Assessment & Plan Note (Signed)
LDL 79 on atorvastatin 40 mg daily.

## 2023-01-06 NOTE — Progress Notes (Signed)
Established Patient Office Visit     CC/Reason for Visit: Follow-up conditions  HPI: Tammy [name] is a 82 y.o. female who is coming in today for the above mentioned reasons. Past Medical History is significant for: Hypertension, hyperlipidemia, chronic kidney disease stage III, obesity, iron deficiency anemia, impaired glucose tolerance, GERD.  She is feeling well.  She wonders if she should continue B12.   Past Medical/Surgical History: Past Medical History:  Diagnosis Date   Anxiety    Arthritis    knees and back   Bruised rib 01/18/2014   x-rayed   Chicken pox    DDD (degenerative disc disease), lumbar    Depression    mild, never on medication   Deviated septum    GERD (gastroesophageal reflux disease)    Heart murmur    told in 2012 benign   Hemorrhoids    High cholesterol    borderline, no meds   Hyperglycemia    borderline   Hypertension    high blood pressure readings, no meds   Osteoarthritis    Pre-diabetes    diet- controlled   Rib pain on left side 01/20/2014    xray done seen by MD 01/20/14 in EPIC    Scoliosis    Varicose vein     Past Surgical History:  Procedure Laterality Date   ABDOMINAL HYSTERECTOMY  1972   COLONOSCOPY  05/27/2013   DENTAL SURGERY  2007   left jaw bone graft, 2 implants on left, 2 implants on right   HEMORRHOID BANDING  06/03/13   HEMORROIDECTOMY  1975   INCISION AND DRAINAGE BREAST ABSCESS Bilateral 1966, 1967   MASS EXCISION N/A 04/22/2013   Procedure: EXCISION MULTPILE soft tissue masses and abnormal skin lesion left thigh /left chest/ left posterior shoulder/ right flank ;  Surgeon: Shelly Rubenstein, MD;  [facility];  Service: General;  Laterality: N/A;  multiple sites:  left thigh, left shoulder, right flank, right upper arm   OVARIAN CYST SURGERY Bilateral 1991   OVARIAN CYST SURGERY     SEPTOPLASTY Bilateral 12/16/2016   Procedure: SEPTOPLASTY;  Surgeon: Newman Pies, MD;  Location: [facility];  Service: ENT;  Laterality: Bilateral;   TONSILLECTOMY AND ADENOIDECTOMY  1973   TOTAL KNEE ARTHROPLASTY Right 07/25/2013   Procedure: RIGHT TOTAL KNEE ARTHROPLASTY;  Surgeon: Loanne Drilling, MD;  [facility];  Service: Orthopedics;  Laterality: Right;  Left knee cortisone injection   TOTAL KNEE ARTHROPLASTY Left 01/30/2014   Procedure: LEFT TOTAL KNEE ARTHROPLASTY;  Surgeon: Loanne Drilling, MD;  [facility];  Service: Orthopedics;  Laterality: Left;   TUBAL LIGATION  1970    Social History:  reports that she has never smoked. She has never used smokeless tobacco. She reports current alcohol use. She reports that she does not use drugs.  Allergies: Allergies  Allergen Reactions   Hydrocodone     Made her feel very strange, wired, restless  Made her feel very strange, wired, restless   Penicillins Rash    Family History:  Family History  Problem Relation Age of Onset   Alcohol abuse Father    Heart disease Father    Arthritis Mother    Stroke Mother    Hypertension Mother    Diabetes Mother    Breast cancer Maternal Grandmother    Breast cancer Maternal Aunt    Colon cancer Maternal Uncle    Esophageal cancer Neg Hx    Stomach  cancer Neg Hx    Rectal cancer Neg Hx      Current Outpatient Medications:    amLODipine-valsartan (EXFORGE) 5-160 MG tablet, TAKE 1 TABLET BY MOUTH EVERY DAY, Disp: 90 tablet, Rfl: 3   aspirin EC 81 MG tablet, Take 1 tablet (81 mg total) by mouth daily. Swallow whole., Disp: 30 tablet, Rfl: 12   atorvastatin (LIPITOR) 40 MG tablet, TAKE 1 TABLET BY MOUTH EVERYDAY AT BEDTIME, Disp: 90 tablet, Rfl: 0   bisacodyl (DULCOLAX) 5 MG EC tablet, Take 5 mg by mouth in the morning and at bedtime., Disp: , Rfl:    cyclobenzaprine (FLEXERIL) 10 MG tablet, Take 10 mg by mouth 3 (three) times daily as needed., Disp: , Rfl:    gabapentin (NEURONTIN) 100 MG capsule, at bedtime., Disp: , Rfl:    LINZESS 72 MCG capsule, Take 1 capsule every day by  oral route., Disp: , Rfl:    Multiple Vitamins-Minerals (MULTI-VITAMIN GUMMIES PO), Take by mouth., Disp: , Rfl:    Multiple Vitamins-Minerals (VITRUM 50+ SENIOR MULTI PO), Take by mouth., Disp: , Rfl:    oxyCODONE (OXY IR/ROXICODONE) 5 MG immediate release tablet, Take 1-2 tablets (5-10 mg total) by mouth every 3 (three) hours as needed for breakthrough pain. (Patient taking differently: Take 10 mg by mouth in the morning.), Disp: 80 tablet, Rfl: 0   oxymetazoline (AFRIN) 0.05 % nasal spray, Place 1 spray into both nostrils 2 (two) times daily as needed., Disp: , Rfl:    pantoprazole (PROTONIX) 40 MG tablet, TAKE 1 TABLET BY MOUTH TWICE A DAY, Disp: 180 tablet, Rfl: 0   traMADol (ULTRAM) 50 MG tablet, Take 1-2 tablets (50-100 mg total) by mouth every 6 (six) hours as needed for moderate pain. (Patient taking differently: Take 50-100 mg by mouth at bedtime as needed for moderate pain.), Disp: 60 tablet, Rfl: 0   Valerian 450 MG CAPS, Take by mouth., Disp: , Rfl:    cyanocobalamin (VITAMIN B12) 1000 MCG/ML injection, inject 1 ml once a month, Disp: 6 mL, Rfl: 11   Iron, Ferrous Sulfate, 325 (65 Fe) MG TABS, Take 325 mg by mouth 2 (two) times daily. (Patient not taking: Reported on 01/06/2023), Disp: 30 tablet, Rfl:    SYRINGE-NEEDLE, DISP, 3 ML (BD SAFETYGLIDE SYRINGE/NEEDLE) 25G X 1" 3 ML MISC, Use to inject b12, Disp: 100 each, Rfl: 11  Review of Systems:  Negative unless indicated in HPI.   Physical Exam: Vitals:   01/06/23 1256  BP: 130/78  Pulse: 79  Temp: 98.4 F (36.9 C)  TempSrc: Oral  SpO2: 95%  Weight: 187 lb 4.8 oz (85 kg)    Body mass index is 36.58 kg/m.   Physical Exam Vitals reviewed.  Constitutional:      Appearance: Normal appearance.  HENT:     Head: Normocephalic and atraumatic.  Eyes:     Conjunctiva/sclera: Conjunctivae normal.     Pupils: Pupils are equal, round, and reactive to light.  Cardiovascular:     Rate and Rhythm: Normal rate and regular rhythm.   Pulmonary:     Effort: Pulmonary effort is normal.     Breath sounds: Normal breath sounds.  Skin:    General: Skin is warm and dry.  Neurological:     General: No focal deficit present.     Mental Status: She is alert and oriented to person, place, and time.  Psychiatric:        Mood and Affect: Mood normal.  Behavior: Behavior normal.        Thought Content: Thought content normal.        Judgment: Judgment normal.      Impression and Plan:  Prediabetes Assessment & Plan: A1c remains stable to improved at 5.5.  Orders: -     POCT glycosylated hemoglobin (Hb A1C)  Essential hypertension, benign Assessment & Plan: Well controlled on current regimen.   Stage 3a chronic kidney disease (HCC) Assessment & Plan: Baseline Cr 1.1-1.2. Stable.   Gastroesophageal reflux disease without esophagitis Assessment & Plan: Well controlled on daily PPI therapy.   Hyperlipidemia, mild Assessment & Plan: LDL 79 on atorvastatin 40 mg daily.   Vitamin B12 deficiency Assessment & Plan: Send in refills for monthly IM. Recheck levels with next lab draw.  Orders: -     Cyanocobalamin; inject 1 ml once a month  Dispense: 6 mL; Refill: 11 -     BD SafetyGlide Syringe/Needle; Use to inject b12  Dispense: 100 each; Refill: 11  Obesity (BMI 30-39.9) Assessment & Plan: -Discussed healthy lifestyle, including increased physical activity and better food choices to promote weight loss.       Time spent:32 minutes reviewing chart, interviewing and examining patient and formulating plan of care.     Chaya Jan, MD [facility]

## 2023-01-06 NOTE — Assessment & Plan Note (Signed)
Well controlled on daily PPI therapy. 

## 2023-01-06 NOTE — Assessment & Plan Note (Signed)
Send in refills for monthly IM. Recheck levels with next lab draw.

## 2023-01-06 NOTE — Assessment & Plan Note (Signed)
Baseline Cr 1.1-1.2. Stable.

## 2023-01-06 NOTE — Assessment & Plan Note (Signed)
Well-controlled on current regimen. ?

## 2023-01-06 NOTE — Assessment & Plan Note (Signed)
A1c remains stable to improved at 5.5.

## 2023-01-06 NOTE — Assessment & Plan Note (Signed)
Discussed healthy lifestyle, including increased physical activity and better food choices to promote weight loss.  

## 2023-04-15 DIAGNOSIS — M5416 Radiculopathy, lumbar region: Secondary | ICD-10-CM | POA: Diagnosis not present

## 2023-04-15 DIAGNOSIS — G894 Chronic pain syndrome: Secondary | ICD-10-CM | POA: Diagnosis not present

## 2023-04-15 DIAGNOSIS — N62 Hypertrophy of breast: Secondary | ICD-10-CM | POA: Diagnosis not present

## 2023-04-15 DIAGNOSIS — Z5181 Encounter for therapeutic drug level monitoring: Secondary | ICD-10-CM | POA: Diagnosis not present

## 2023-04-15 DIAGNOSIS — Z79899 Other long term (current) drug therapy: Secondary | ICD-10-CM | POA: Diagnosis not present

## 2023-04-15 DIAGNOSIS — T402X5D Adverse effect of other opioids, subsequent encounter: Secondary | ICD-10-CM | POA: Diagnosis not present

## 2023-04-21 ENCOUNTER — Other Ambulatory Visit: Payer: Self-pay | Admitting: Internal Medicine

## 2023-04-21 DIAGNOSIS — K219 Gastro-esophageal reflux disease without esophagitis: Secondary | ICD-10-CM

## 2023-05-05 ENCOUNTER — Ambulatory Visit: Payer: Medicare Other | Admitting: Physician Assistant

## 2023-05-14 ENCOUNTER — Ambulatory Visit (INDEPENDENT_AMBULATORY_CARE_PROVIDER_SITE_OTHER): Payer: Medicare Other | Admitting: Physician Assistant

## 2023-05-14 ENCOUNTER — Encounter: Payer: Self-pay | Admitting: Physician Assistant

## 2023-05-14 VITALS — BP 132/73 | HR 62 | Resp 20 | Ht 60.0 in | Wt 185.0 lb

## 2023-05-14 DIAGNOSIS — G3184 Mild cognitive impairment, so stated: Secondary | ICD-10-CM

## 2023-05-14 MED ORDER — DONEPEZIL HCL 5 MG PO TABS: 5.0000 mg | ORAL_TABLET | Freq: Every day | ORAL | 11 refills | Status: DC

## 2023-05-14 NOTE — Patient Instructions (Signed)
It was a pleasure to see you today at our office.   Recommendations:  Replenish iron and B12 Control of all of the CV risk factors.  Start Donepezil 5mg  daily.   RECOMMENDATIONS FOR ALL PATIENTS WITH MEMORY PROBLEMS: 1. Continue to exercise (Recommend 30 minutes of walking everyday, or 3 hours every week) 2. Increase social interactions - continue going to [place] and enjoy social gatherings with friends and family 3. Eat healthy, avoid fried foods and eat more fruits and vegetables 4. Maintain adequate blood pressure, blood sugar, and blood cholesterol level. Reducing the risk of stroke and cardiovascular disease also helps promoting better memory. 5. Avoid stressful situations. Live a simple life and avoid aggravations. Organize your time and prepare for the next day in anticipation. 6. Sleep well, avoid any interruptions of sleep and avoid any distractions in the bedroom that may interfere with adequate sleep quality 7. Avoid sugar, avoid sweets as there is a strong link between excessive sugar intake, diabetes, and cognitive impairment We discussed the Mediterranean diet, which has been shown to help patients reduce the risk of progressive memory disorders and reduces cardiovascular risk. This includes eating fish, eat fruits and green leafy vegetables, nuts like almonds and hazelnuts, walnuts, and also use olive oil. Avoid fast foods and fried foods as much as possible. Avoid sweets and sugar as sugar use has been linked to worsening of memory function.  There is always a concern of gradual progression of memory problems. If this is the case, then we may need to adjust level of care according to patient needs. Support, both to the patient and caregiver, should then be put into place.     FALL PRECAUTIONS: Be cautious when walking. Scan the area for obstacles that may increase the risk of trips and falls. When getting up in the mornings, sit up at the edge of the bed for a few minutes before  getting out of bed. Consider elevating the bed at the head end to avoid drop of blood pressure when getting up. Walk always in a well-lit room (use night lights in the walls). Avoid area rugs or power cords from appliances in the middle of the walkways. Use a walker or a cane if necessary and consider physical therapy for balance exercise. Get your eyesight checked regularly.  FINANCIAL OVERSIGHT: Supervision, especially oversight when making financial decisions or transactions is also recommended.  HOME SAFETY: Consider the safety of the kitchen when operating appliances like stoves, microwave oven, and blender. Consider having supervision and share cooking responsibilities until no longer able to participate in those. Accidents with firearms and other hazards in the house should be identified and addressed as well.   ABILITY TO BE LEFT ALONE: If patient is unable to contact 911 operator, consider using LifeLine, or when the need is there, arrange for someone to stay with patients. Smoking is a fire hazard, consider supervision or cessation. Risk of wandering should be assessed by caregiver and if detected at any point, supervision and safe proof recommendations should be instituted.  MEDICATION SUPERVISION: Inability to self-administer medication needs to be constantly addressed. Implement a mechanism to ensure safe administration of the medications.   DRIVING: Regarding driving, in patients with progressive memory problems, driving will be impaired. We advise to have someone else do the driving if trouble finding directions or if minor accidents are reported. Independent driving assessment is available to determine safety of driving.  Consider [facility]  [address], [place] [address]  [phone]  Hours of Operation Mondays to Thursdays: 8 am to 8 pm,Fridays: 9 am to 8 pm, Saturdays: 9 am to 1 pm Sundays:  Closed  https://[email or web address].gov/departments/parks-recreation/active-adults-50/[facility]   If you are interested in the driving assessment, you can contact the following:  [employer] in [place] [phone]  [facility] [phone]  [facility] [phone] or [phone]    Mediterranean Diet A Mediterranean diet refers to food and lifestyle choices that are based on the traditions of countries located on the [employer]. This way of eating has been shown to help prevent certain conditions and improve outcomes for people who have chronic diseases, like kidney disease and heart disease. What are tips for following this plan? Lifestyle  Cook and eat meals together with your family, when possible. Drink enough fluid to keep your urine clear or pale yellow. Be physically active every day. This includes: Aerobic exercise like running or swimming. Leisure activities like gardening, walking, or housework. Get 7-8 hours of sleep each night. If recommended by your health care provider, drink red wine in moderation. This means 1 glass a day for nonpregnant women and 2 glasses a day for men. A glass of wine equals 5 oz (150 mL). Reading food labels  Check the serving size of packaged foods. For foods such as rice and pasta, the serving size refers to the amount of cooked product, not dry. Check the total fat in packaged foods. Avoid foods that have saturated fat or trans fats. Check the ingredients list for added sugars, such as corn syrup. Shopping  At the grocery store, buy most of your food from the areas near the walls of the store. This includes: Fresh fruits and vegetables (produce). Grains, beans, nuts, and seeds. Some of these may be available in unpackaged forms or large amounts (in bulk). Fresh seafood. Poultry and eggs. Low-fat dairy products. Buy whole ingredients instead  of prepackaged foods. Buy fresh fruits and vegetables in-season from local farmers markets. Buy frozen fruits and vegetables in resealable bags. If you do not have access to quality fresh seafood, buy precooked frozen shrimp or canned fish, such as tuna, salmon, or sardines. Buy small amounts of raw or cooked vegetables, salads, or olives from the deli or salad bar at your store. Stock your pantry so you always have certain foods on hand, such as olive oil, canned tuna, canned tomatoes, rice, pasta, and beans. Cooking  Cook foods with extra-virgin olive oil instead of using butter or other vegetable oils. Have meat as a side dish, and have vegetables or grains as your main dish. This means having meat in small portions or adding small amounts of meat to foods like pasta or stew. Use beans or vegetables instead of meat in common dishes like chili or lasagna. Experiment with different cooking methods. Try roasting or broiling vegetables instead of steaming or sauteing them. Add frozen vegetables to soups, stews, pasta, or rice. Add nuts or seeds for added healthy fat at each meal. You can add these to yogurt, salads, or vegetable dishes. Marinate fish or vegetables using olive oil, lemon juice, garlic, and fresh herbs. Meal planning  Plan to eat 1 vegetarian meal one day each week. Try to work up to 2 vegetarian meals, if possible. Eat seafood 2 or more times a week. Have healthy snacks readily available, such as: Vegetable sticks with hummus. Greek yogurt. Fruit and nut trail mix. Eat balanced meals throughout the week. This includes: Fruit:  2-3 servings a day Vegetables: 4-5 servings a day Low-fat dairy: 2 servings a day Fish, poultry, or lean meat: 1 serving a day Beans and legumes: 2 or more servings a week Nuts and seeds: 1-2 servings a day Whole grains: 6-8 servings a day Extra-virgin olive oil: 3-4 servings a day Limit red meat and sweets to only a few servings a month What are  my food choices? Mediterranean diet Recommended Grains: Whole-grain pasta. Brown rice. Bulgar wheat. Polenta. Couscous. Whole-wheat bread. Orpah Cobb. Vegetables: Artichokes. Beets. Broccoli. Cabbage. Carrots. Eggplant. Green beans. Chard. Kale. Spinach. Onions. Leeks. Peas. Squash. Tomatoes. Peppers. Radishes. Fruits: Apples. Apricots. Avocado. Berries. Bananas. Cherries. Dates. Figs. Grapes. Lemons. Melon. Oranges. Peaches. Plums. Pomegranate. Meats and other protein foods: Beans. Almonds. Sunflower seeds. Pine nuts. Peanuts. Cod. Salmon. Scallops. Shrimp. Tuna. Tilapia. Clams. Oysters. Eggs. Dairy: Low-fat milk. Cheese. Greek yogurt. Beverages: Water. Red wine. Herbal tea. Fats and oils: Extra virgin olive oil. Avocado oil. Grape seed oil. Sweets and desserts: [place] yogurt with honey. Baked apples. Poached pears. Trail mix. Seasoning and other foods: Basil. Cilantro. Coriander. Cumin. Mint. Parsley. Sage. Rosemary. Tarragon. Garlic. Oregano. Thyme. Pepper. Balsalmic vinegar. Tahini. Hummus. Tomato sauce. Olives. Mushrooms. Limit these Grains: Prepackaged pasta or rice dishes. Prepackaged cereal with added sugar. Vegetables: Deep fried potatoes (french fries). Fruits: Fruit canned in syrup. Meats and other protein foods: Beef. Pork. Lamb. Poultry with skin. Hot dogs. Tomasa Blase. Dairy: Ice cream. Sour cream. Whole milk. Beverages: Juice. Sugar-sweetened soft drinks. Beer. Liquor and spirits. Fats and oils: Butter. Canola oil. Vegetable oil. Beef fat (tallow). Lard. Sweets and desserts: Cookies. Cakes. Pies. Candy. Seasoning and other foods: Mayonnaise. Premade sauces and marinades. The items listed may not be a complete list. Talk with your dietitian about what dietary choices are right for you. Summary The Mediterranean diet includes both food and lifestyle choices. Eat a variety of fresh fruits and vegetables, beans, nuts, seeds, and whole grains. Limit the amount of red meat and sweets  that you eat. Talk with your health care provider about whether it is safe for you to drink red wine in moderation. This means 1 glass a day for nonpregnant women and 2 glasses a day for men. A glass of wine equals 5 oz (150 mL). This information is not intended to replace advice given to you by your health care provider. Make sure you discuss any questions you have with your health care provider. Document Released: 04/03/2016 Document Revised: 05/06/2016 Document Reviewed: 04/03/2016 Elsevier Interactive Patient Education  2017 [employer].  .cre

## 2023-05-14 NOTE — Progress Notes (Addendum)
 Assessment/Plan:   Memory Impairment   Tammy [name] is a very pleasant 82 y.o. RH female with a history of hypertension, hyperlipidemia, depression, iron deficiency anemia, B12 deficiency, arthritis, impaired glucose tolerance  presenting today in follow-up for evaluation of memory loss.  She reports some more difficulty remembering words.  MMSE today 28/30. She is not on medication for memory. Patient is able to participate on his IADLs.  Continues to drive       Recommendations:   Follow up in 6 months Start Donepezil 5mg   daily. Side effects discussed   Continue B12 supplements Recommend good control of cardiovascular risk factors Continue to control mood as per PCP    Subjective:   This patient is accompanied in the office by   who supplements the history. Previous records as well as any outside records available were reviewed prior to todays visit.   Patient was last seen on 04/01/22.    Any changes in memory since last visit? "Patient has some difficulty remembering recent conversations and people names. LTM is good.  repeats oneself?  Endorsed Disoriented when walking into a room?  Patient denies   Leaving objects in unusual places?  Patient denies   Wandering behavior?   denies   Any personality changes since last visit? denies   Any worsening depression?: denies.   Hallucinations or paranoia?  Denies.   Seizures?   denies    Any sleep changes?  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   denies    Any hygiene concerns?   denies   Independent of bathing and dressing?  Endorsed  Does the patient needs help with medications? Patient is in charge   Who is in charge of the finances?  Patient is in charge     Any changes in appetite?  denies     Patient have trouble swallowing?  denies   Does the patient cook?  Any kitchen accidents such as leaving the stove on?   denies    Any headaches?    denies   Vision changes? denies Chronic pain?  denies   Ambulates with difficulty? denies    Recent falls or head injuries  denies      Unilateral weakness, numbness or tingling?   denies   Any tremors?  denies   Any anosmia?    denies   Any incontinence of urine?  denies   Any bowel dysfunction?  denies      Patient lives alone in a Senior community   Does the patient drive? Yes, denies any issues   Initial Visit 10/02/21 The patient is seen in neurologic consultation at the request of Tammy [name], Tammy [name]* for the evaluation of memory.  The patient is accompanied by her daughter who supplements the history. This is a delightful 82 y.o. 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  She reports that she was caring for an old lady and had to begin writing and at least the findings on the things to do, because she was having difficulty remembering them.  She also was feeling much more tired than prior.  "I used to be much heavier, I decided to lose weight, lost 25 pounds in a diet and I still was tired and my memory was not good ".  She states that she could not compute,  could not repeat what is was being said.  This was troubling to her, causing some depression.  She denies any difficulties with long-term memory.  She lives alone, and is fairly independent.  She does write everything since that incident.  She does her own cooking, cleaning and laundry, but she becomes very short of breath when going in heels, which was felt to be due to iron deficiency.  She is not disoriented when walking into her room.  She denies leaving objects in unusual places.  She ambulates without significant difficulties.  She had a recent fall which brought her to the ED recently, after her toe was caught in the pillow and "took a nose dive hurting the left knee and the right shoulder ".  She had a nosebleed, but did not have any fractures.  She  denies wandering off.  She continues to drive short distances, if she has to do a long distance, she uses a GPS.  She denies irritability.  She does not do crossword puzzles or word finding often.  She does like to watch Jeopardy.  Inform her night nurse, she feels that her sleep cycle has always been different.  She goes to bed at 11 PM, and she may be waking up an hour or 2 later.  She never had a sleep study.  She states that she could be up again at 4 in the morning.  She does feel slightly tired when waking up, and denies sleepwalking, hallucinations or paranoia.  When she sleeps she has vivid dreams.  There are no hygiene concerns, she is independent of bathing and dressing, and she puts the medications in a pillbox without missing doses.  Her daughter manages the finances.  Her appetite is good, denies trouble swallowing.  She cooks, randomly, she may have seldom, she may have left the stove on accidentally.  She denies any headaches, double vision, dizziness, focal numbness or tingling, unilateral weakness, tremors or anosmia.  She complains of her feet being very cold.  No history of seizures.  Denies any urine incontinence, retention, constipation or diarrhea, sleep apnea, alcohol or tobacco history, family history remarkable for mother with dementia.   CT of the head without contrast 09/16/2018 no acute intracranial pathology.2. Moderate chronic white matter microangiopathy.   Past Medical History:  Diagnosis Date   Anxiety    Arthritis    knees and back   Bruised rib 01/18/2014   x-rayed   Chicken pox    DDD (degenerative disc disease), lumbar    Depression    mild, never on medication   Deviated septum    GERD (gastroesophageal reflux disease)    Heart murmur    told in 2012 benign   Hemorrhoids    High cholesterol    borderline, no meds   Hyperglycemia    borderline   Hypertension    high blood pressure readings, no meds   Osteoarthritis    Pre-diabetes    diet- controlled    Rib pain on left side 01/20/2014    xray done seen by MD 01/20/14 in [facility]    Scoliosis    Varicose vein      Past Surgical History:  Procedure Laterality Date   ABDOMINAL HYSTERECTOMY  1972   COLONOSCOPY  05/27/2013   DENTAL SURGERY  2007   left jaw bone graft, 2 implants on left, 2 implants on right   HEMORRHOID BANDING  06/03/13   HEMORROIDECTOMY  1975   INCISION AND DRAINAGE BREAST ABSCESS  Bilateral 1966, 1967   MASS EXCISION N/A 04/22/2013   Procedure: EXCISION MULTPILE soft tissue masses and abnormal skin lesion left thigh /left chest/ left posterior shoulder/ right flank ;  Surgeon: Shelly Rubenstein, MD;  Location: [facility];  Service: General;  Laterality: N/A;  multiple sites:  left thigh, left shoulder, right flank, right upper arm   OVARIAN CYST SURGERY Bilateral 1991   OVARIAN CYST SURGERY     SEPTOPLASTY Bilateral 12/16/2016   Procedure: SEPTOPLASTY;  Surgeon: Newman Pies, MD;  Location: [facility];  Service: ENT;  Laterality: Bilateral;   TONSILLECTOMY AND ADENOIDECTOMY  1973   TOTAL KNEE ARTHROPLASTY Right 07/25/2013   Procedure: RIGHT TOTAL KNEE ARTHROPLASTY;  Surgeon: Loanne Drilling, MD;  Location: [facility];  Service: Orthopedics;  Laterality: Right;  Left knee cortisone injection   TOTAL KNEE ARTHROPLASTY Left 01/30/2014   Procedure: LEFT TOTAL KNEE ARTHROPLASTY;  Surgeon: Loanne Drilling, MD;  Location: [facility];  Service: Orthopedics;  Laterality: Left;   TUBAL LIGATION  1970     PREVIOUS MEDICATIONS:   CURRENT MEDICATIONS:  Outpatient Encounter Medications as of 05/14/2023  Medication Sig   amLODipine-valsartan (EXFORGE) 5-160 MG tablet TAKE 1 TABLET BY MOUTH EVERY DAY   aspirin EC 81 MG tablet Take 1 tablet (81 mg total) by mouth daily. Swallow whole.   atorvastatin (LIPITOR) 40 MG tablet TAKE 1 TABLET BY MOUTH EVERYDAY AT BEDTIME   bisacodyl (DULCOLAX) 5 MG EC tablet Take 5 mg by mouth in the morning and at bedtime.   cyanocobalamin (VITAMIN B12) 1000 MCG/ML  injection inject 1 ml once a month   cyclobenzaprine (FLEXERIL) 10 MG tablet Take 10 mg by mouth 3 (three) times daily as needed.   donepezil (ARICEPT) 5 MG tablet Take 1 tablet (5 mg total) by mouth daily.   gabapentin (NEURONTIN) 100 MG capsule at bedtime.   LINZESS 72 MCG capsule Take 1 capsule every day by oral route.   Multiple Vitamins-Minerals (MULTI-VITAMIN GUMMIES PO) Take by mouth.   Multiple Vitamins-Minerals (VITRUM 50+ SENIOR MULTI PO) Take by mouth.   oxyCODONE (OXY IR/ROXICODONE) 5 MG immediate release tablet Take 1-2 tablets (5-10 mg total) by mouth every 3 (three) hours as needed for breakthrough pain. (Patient taking differently: Take 10 mg by mouth in the morning.)   pantoprazole (PROTONIX) 40 MG tablet TAKE 1 TABLET BY MOUTH TWICE A DAY   SYRINGE-NEEDLE, DISP, 3 ML (BD SAFETYGLIDE SYRINGE/NEEDLE) 25G X 1" 3 ML MISC Use to inject b12   traMADol (ULTRAM) 50 MG tablet Take 1-2 tablets (50-100 mg total) by mouth every 6 (six) hours as needed for moderate pain. (Patient taking differently: Take 50-100 mg by mouth at bedtime as needed for moderate pain.)   Valerian 450 MG CAPS Take by mouth.   oxymetazoline (AFRIN) 0.05 % nasal spray Place 1 spray into both nostrils 2 (two) times daily as needed. (Patient not taking: Reported on 05/14/2023)   [DISCONTINUED] Iron, Ferrous Sulfate, 325 (65 Fe) MG TABS Take 325 mg by mouth 2 (two) times daily. (Patient not taking: Reported on 01/06/2023)   No facility-administered encounter medications on file as of 05/14/2023.     Objective:     PHYSICAL EXAMINATION:    VITALS:   Vitals:   05/14/23 1447  BP: 132/73  Pulse: 62  Resp: 20  SpO2: 99%  Weight: 185 lb (83.9 kg)  Height: 5' (1.524 m)    GEN:  The patient appears stated age and is in NAD.  HEENT:  Normocephalic, atraumatic.   Neurological examination:  General: NAD, well-groomed, appears stated age. Orientation: The patient is alert. Oriented to person, place and  date Cranial nerves: There is good facial symmetry.The speech is fluent and clear. No aphasia or dysarthria. Fund of knowledge is appropriate. Recent memory impaired and remote memory is normal.  Attention and concentration are normal.  Able to name objects and repeat phrases.  Hearing is intact to conversational tone .   Delayed recall 1/3 Sensation: Sensation is intact to light touch throughout Motor: Strength is at least antigravity x4. DTR's 2/4 in UE/LE      10/02/2021    2:00 PM  Montreal Cognitive Assessment   Visuospatial/ Executive (0/5) 4  Naming (0/3) 2  Attention: Read list of digits (0/2) 2  Attention: Read list of letters (0/1) 1  Attention: Serial 7 subtraction starting at 100 (0/3) 3  Language: Repeat phrase (0/2) 2  Language : Fluency (0/1) 1  Abstraction (0/2) 2  Delayed Recall (0/5) 5  Orientation (0/6) 6  Total 28  Adjusted Score (based on education) 28       05/14/2023    6:00 PM  MMSE - Mini Mental State Exam  Orientation to time 5  Orientation to Place 5  Registration 3  Attention/ Calculation 5  Recall 1  Language- name 2 objects 2  Language- repeat 1  Language- follow 3 step command 3  Language- read & follow direction 1  Write a sentence 1  Copy design 1  Total score 28       Movement examination: Tone: There is normal tone in the UE/LE Abnormal movements:  no tremor.  No myoclonus.  No asterixis.   Coordination:  There is no decremation with RAM's. Normal finger to nose  Gait and Station: The patient has no difficulty arising out of a deep-seated chair without the use of the hands. The patient's stride length is good.  Gait is cautious and narrow.   Thank you for allowing [place] the opportunity to participate in the care of this nice patient. Please do not hesitate to contact us for any questions or concerns.   Total time spent on today's visit was 20 minutes dedicated to this patient today, preparing to see patient, examining the patient,  ordering tests and/or medications and counseling the patient, documenting clinical information in the EHR or other health record, independently interpreting results and communicating results to the patient/family, discussing treatment and goals, answering patient's questions and coordinating care.  Cc:  Tammy [name], Limmie Patricia, MD  Marlowe Kays 05/14/2023 6:47 PM

## 2023-05-31 ENCOUNTER — Other Ambulatory Visit: Payer: Self-pay | Admitting: Internal Medicine

## 2023-06-18 ENCOUNTER — Encounter: Payer: Self-pay | Admitting: Internal Medicine

## 2023-06-18 ENCOUNTER — Ambulatory Visit: Payer: Medicare Other | Admitting: Internal Medicine

## 2023-06-18 VITALS — BP 130/70 | HR 73 | Temp 98.1°F | Wt 188.1 lb

## 2023-06-18 DIAGNOSIS — M810 Age-related osteoporosis without current pathological fracture: Secondary | ICD-10-CM | POA: Diagnosis not present

## 2023-06-18 DIAGNOSIS — E538 Deficiency of other specified B group vitamins: Secondary | ICD-10-CM

## 2023-06-18 DIAGNOSIS — E669 Obesity, unspecified: Secondary | ICD-10-CM

## 2023-06-18 DIAGNOSIS — N1831 Chronic kidney disease, stage 3a: Secondary | ICD-10-CM | POA: Diagnosis not present

## 2023-06-18 DIAGNOSIS — Z1231 Encounter for screening mammogram for malignant neoplasm of breast: Secondary | ICD-10-CM

## 2023-06-18 DIAGNOSIS — R7303 Prediabetes: Secondary | ICD-10-CM

## 2023-06-18 DIAGNOSIS — I10 Essential (primary) hypertension: Secondary | ICD-10-CM

## 2023-06-18 DIAGNOSIS — E785 Hyperlipidemia, unspecified: Secondary | ICD-10-CM | POA: Diagnosis not present

## 2023-06-18 DIAGNOSIS — Z23 Encounter for immunization: Secondary | ICD-10-CM | POA: Diagnosis not present

## 2023-06-18 LAB — LIPID PANEL
Cholesterol: 140 mg/dL (ref 0–200)
HDL: 51.5 mg/dL (ref >39.00)
LDL Cholesterol: 65 mg/dL (ref 0–99)
NonHDL: 88.44
Total CHOL/HDL Ratio: 3
Triglycerides: 119 mg/dL (ref 0.0–149.0)
VLDL: 23.8 mg/dL (ref 0.0–40.0)

## 2023-06-18 LAB — COMPREHENSIVE METABOLIC PANEL
ALT: 14 U/L (ref 0–35)
AST: 18 U/L (ref 0–37)
Albumin: 4.2 g/dL (ref 3.5–5.2)
Alkaline Phosphatase: 100 U/L (ref 39–117)
BUN: 20 mg/dL (ref 6–23)
CO2: 28 meq/L (ref 19–32)
Calcium: 9.3 mg/dL (ref 8.4–10.5)
Chloride: 104 meq/L (ref 96–112)
Creatinine, Ser: 1.55 mg/dL — ABNORMAL HIGH (ref 0.40–1.20)
GFR: 30.85 mL/min — ABNORMAL LOW (ref >60.00)
Glucose, Bld: 116 mg/dL — ABNORMAL HIGH (ref 70–99)
Potassium: 4.8 meq/L (ref 3.5–5.1)
Sodium: 137 meq/L (ref 135–145)
Total Bilirubin: 0.5 mg/dL (ref 0.2–1.2)
Total Protein: 7.5 g/dL (ref 6.0–8.3)

## 2023-06-18 LAB — CBC WITH DIFFERENTIAL/PLATELET
Basophils Absolute: 0.1 10*3/uL (ref 0.0–0.1)
Basophils Relative: 0.8 % (ref 0.0–3.0)
Eosinophils Absolute: 0.2 10*3/uL (ref 0.0–0.7)
Eosinophils Relative: 2.5 % (ref 0.0–5.0)
HCT: 40 % (ref 36.0–46.0)
Hemoglobin: 12.4 g/dL (ref 12.0–15.0)
Lymphocytes Relative: 29.2 % (ref 12.0–46.0)
Lymphs Abs: 1.9 10*3/uL (ref 0.7–4.0)
MCHC: 31.1 g/dL (ref 30.0–36.0)
MCV: 84.8 fL (ref 78.0–100.0)
Monocytes Absolute: 0.4 10*3/uL (ref 0.1–1.0)
Monocytes Relative: 7 % (ref 3.0–12.0)
Neutro Abs: 3.9 10*3/uL (ref 1.4–7.7)
Neutrophils Relative %: 60.5 % (ref 43.0–77.0)
Platelets: 226 10*3/uL (ref 150.0–400.0)
RBC: 4.71 Mil/uL (ref 3.87–5.11)
RDW: 15.3 % (ref 11.5–15.5)
WBC: 6.4 10*3/uL (ref 4.0–10.5)

## 2023-06-18 LAB — POCT GLYCOSYLATED HEMOGLOBIN (HGB A1C): Hemoglobin A1C: 5.7 % — AB (ref 4.0–5.6)

## 2023-06-18 LAB — VITAMIN B12: Vitamin B-12: 718 pg/mL (ref 211–911)

## 2023-06-18 NOTE — Assessment & Plan Note (Signed)
Check levels today 

## 2023-06-18 NOTE — Progress Notes (Signed)
Established Patient Office Visit     CC/Reason for Visit: Follow-up chronic medical conditions  HPI: Tammy [name] is a 82 y.o. female who is coming in today for the above mentioned reasons. Past Medical History is significant for: Hypertension, hyperlipidemia, chronic kidney disease stage III, impaired glucose tolerance, vitamin B12 deficiency, iron deficiency anemia, GERD.  Has been feeling well.  Is requesting labs today.  Requesting flu vaccine.  Is overdue for mammogram.   Past Medical/Surgical History: Past Medical History:  Diagnosis Date   Anxiety    Arthritis    knees and back   Bruised rib 01/18/2014   x-rayed   Chicken pox    DDD (degenerative disc disease), lumbar    Depression    mild, never on medication   Deviated septum    GERD (gastroesophageal reflux disease)    Heart murmur    told in 2012 benign   Hemorrhoids    High cholesterol    borderline, no meds   Hyperglycemia    borderline   Hypertension    high blood pressure readings, no meds   Osteoarthritis    Pre-diabetes    diet- controlled   Rib pain on left side 01/20/2014    xray done seen by MD 01/20/14 in [facility]    Scoliosis    Varicose vein     Past Surgical History:  Procedure Laterality Date   ABDOMINAL HYSTERECTOMY  1972   COLONOSCOPY  05/27/2013   DENTAL SURGERY  2007   left jaw bone graft, 2 implants on left, 2 implants on right   HEMORRHOID BANDING  06/03/13   HEMORROIDECTOMY  1975   INCISION AND DRAINAGE BREAST ABSCESS Bilateral 1966, 1967   MASS EXCISION N/A 04/22/2013   Procedure: EXCISION MULTPILE soft tissue masses and abnormal skin lesion left thigh /left chest/ left posterior shoulder/ right flank ;  Surgeon: Shelly Rubenstein, MD;  Location: [facility];  Service: General;  Laterality: N/A;  multiple sites:  left thigh, left shoulder, right flank, right upper arm   OVARIAN CYST SURGERY Bilateral 1991   OVARIAN CYST SURGERY     SEPTOPLASTY Bilateral 12/16/2016    Procedure: SEPTOPLASTY;  Surgeon: Newman Pies, MD;  Location: [facility];  Service: ENT;  Laterality: Bilateral;   TONSILLECTOMY AND ADENOIDECTOMY  1973   TOTAL KNEE ARTHROPLASTY Right 07/25/2013   Procedure: RIGHT TOTAL KNEE ARTHROPLASTY;  Surgeon: Loanne Drilling, MD;  Location: [facility];  Service: Orthopedics;  Laterality: Right;  Left knee cortisone injection   TOTAL KNEE ARTHROPLASTY Left 01/30/2014   Procedure: LEFT TOTAL KNEE ARTHROPLASTY;  Surgeon: Loanne Drilling, MD;  Location: [facility];  Service: Orthopedics;  Laterality: Left;   TUBAL LIGATION  1970    Social History:  reports that she has never smoked. She has never used smokeless tobacco. She reports current alcohol use. She reports that she does not use drugs.  Allergies: Allergies  Allergen Reactions   Hydrocodone     Made her feel very strange, wired, restless  Made her feel very strange, wired, restless   Penicillins Rash    Family History:  Family History  Problem Relation Age of Onset   Alcohol abuse Father    Heart disease Father    Arthritis Mother    Stroke Mother    Hypertension Mother    Diabetes Mother    Breast cancer Maternal Grandmother    Breast cancer Maternal Aunt    Colon cancer Maternal Uncle  Esophageal cancer Neg Hx    Stomach cancer Neg Hx    Rectal cancer Neg Hx      Current Outpatient Medications:    amLODipine-valsartan (EXFORGE) 5-160 MG tablet, TAKE 1 TABLET BY MOUTH EVERY DAY, Disp: 90 tablet, Rfl: 3   aspirin EC 81 MG tablet, Take 1 tablet (81 mg total) by mouth daily. Swallow whole., Disp: 30 tablet, Rfl: 12   atorvastatin (LIPITOR) 40 MG tablet, TAKE 1 TABLET BY MOUTH EVERYDAY AT BEDTIME, Disp: 90 tablet, Rfl: 0   bisacodyl (DULCOLAX) 5 MG EC tablet, Take 5 mg by mouth 2 (two) times daily as needed., Disp: , Rfl:    cyanocobalamin (VITAMIN B12) 1000 MCG/ML injection, inject 1 ml once a month, Disp: 6 mL, Rfl: 11   cyclobenzaprine (FLEXERIL) 10 MG tablet, Take 10 mg by  mouth 3 (three) times daily as needed., Disp: , Rfl:    donepezil (ARICEPT) 5 MG tablet, Take 1 tablet (5 mg total) by mouth daily., Disp: 30 tablet, Rfl: 11   gabapentin (NEURONTIN) 100 MG capsule, at bedtime., Disp: , Rfl:    LINZESS 72 MCG capsule, As needed, Disp: , Rfl:    melatonin 3 MG TABS tablet, Take 3 mg by mouth at bedtime., Disp: , Rfl:    Multiple Vitamins-Minerals (MULTI-VITAMIN GUMMIES PO), Take by mouth., Disp: , Rfl:    Multiple Vitamins-Minerals (VITRUM 50+ SENIOR MULTI PO), Take by mouth., Disp: , Rfl:    oxyCODONE (OXY IR/ROXICODONE) 5 MG immediate release tablet, Take 1-2 tablets (5-10 mg total) by mouth every 3 (three) hours as needed for breakthrough pain. (Patient taking differently: Take 10 mg by mouth in the morning.), Disp: 80 tablet, Rfl: 0   oxymetazoline (AFRIN) 0.05 % nasal spray, Place 1 spray into both nostrils 2 (two) times daily as needed., Disp: , Rfl:    pantoprazole (PROTONIX) 40 MG tablet, TAKE 1 TABLET BY MOUTH TWICE A DAY, Disp: 180 tablet, Rfl: 0   SYRINGE-NEEDLE, DISP, 3 ML (BD SAFETYGLIDE SYRINGE/NEEDLE) 25G X 1" 3 ML MISC, Use to inject b12, Disp: 100 each, Rfl: 11  Review of Systems:  Negative unless indicated in HPI.   Physical Exam: Vitals:   06/18/23 1403  BP: 130/70  Pulse: 73  Temp: 98.1 F (36.7 C)  TempSrc: Oral  SpO2: 99%  Weight: 188 lb 1.6 oz (85.3 kg)    Body mass index is 36.74 kg/m.   Physical Exam Vitals reviewed.  Constitutional:      Appearance: Normal appearance.  HENT:     Head: Normocephalic and atraumatic.  Eyes:     Conjunctiva/sclera: Conjunctivae normal.     Pupils: Pupils are equal, round, and reactive to light.  Cardiovascular:     Rate and Rhythm: Normal rate and regular rhythm.  Pulmonary:     Effort: Pulmonary effort is normal.     Breath sounds: Normal breath sounds.  Skin:    General: Skin is warm and dry.  Neurological:     General: No focal deficit present.     Mental Status: She is alert  and oriented to person, place, and time.  Psychiatric:        Mood and Affect: Mood normal.        Behavior: Behavior normal.        Thought Content: Thought content normal.        Judgment: Judgment normal.      Impression and Plan:  Prediabetes Assessment & Plan: A1c remains stable at 5.7.  Orders: -  POCT glycosylated hemoglobin (Hb A1C)  Vitamin B12 deficiency Assessment & Plan: Check levels today.  Orders: -     Vitamin B12; Future  Flu vaccine need -     Flu Vaccine Trivalent High Dose (Fluad)  Essential hypertension, benign Assessment & Plan: Well controlled on current regimen.  Orders: -     CBC with Differential/Platelet; Future -     Comprehensive metabolic panel; Future  Hyperlipidemia, mild Assessment & Plan: LDL 79 on atorvastatin 40 mg daily.  Check lipids today.  Orders: -     Lipid panel; Future  Obesity (BMI 30-39.9) Assessment & Plan: -Discussed healthy lifestyle, including increased physical activity and better food choices to promote weight loss.    Stage 3a chronic kidney disease (HCC) Assessment & Plan: Baseline Cr 1.1-1.2. Stable.   Age-related osteoporosis without current pathological fracture  Screening mammogram for breast cancer -     3D Screening Mammogram, Left and Right; Future  Immunization due     Time spent:32 minutes reviewing chart, interviewing and examining patient and formulating plan of care.     Chaya Jan, MD [facility]

## 2023-06-18 NOTE — Assessment & Plan Note (Signed)
Baseline Cr 1.1-1.2. Stable.

## 2023-06-18 NOTE — Assessment & Plan Note (Signed)
Discussed healthy lifestyle, including increased physical activity and better food choices to promote weight loss.  

## 2023-06-18 NOTE — Assessment & Plan Note (Signed)
LDL 79 on atorvastatin 40 mg daily.  Check lipids today.

## 2023-06-18 NOTE — Assessment & Plan Note (Signed)
A1c remains stable at 5.7.

## 2023-06-18 NOTE — Assessment & Plan Note (Signed)
Well-controlled on current regimen. ?

## 2023-06-23 ENCOUNTER — Other Ambulatory Visit: Payer: Self-pay | Admitting: *Deleted

## 2023-06-23 DIAGNOSIS — E538 Deficiency of other specified B group vitamins: Secondary | ICD-10-CM

## 2023-06-23 MED ORDER — CYANOCOBALAMIN 1000 MCG/ML IJ SOLN: INTRAMUSCULAR | 11 refills | Status: DC

## 2023-07-26 ENCOUNTER — Other Ambulatory Visit: Payer: Self-pay | Admitting: Internal Medicine

## 2023-07-26 DIAGNOSIS — K219 Gastro-esophageal reflux disease without esophagitis: Secondary | ICD-10-CM

## 2023-08-03 DIAGNOSIS — G894 Chronic pain syndrome: Secondary | ICD-10-CM | POA: Diagnosis not present

## 2023-08-03 DIAGNOSIS — M5416 Radiculopathy, lumbar region: Secondary | ICD-10-CM | POA: Diagnosis not present

## 2023-08-11 DIAGNOSIS — M5416 Radiculopathy, lumbar region: Secondary | ICD-10-CM | POA: Diagnosis not present

## 2023-08-25 ENCOUNTER — Other Ambulatory Visit: Payer: Self-pay | Admitting: Internal Medicine

## 2023-09-01 DIAGNOSIS — M5416 Radiculopathy, lumbar region: Secondary | ICD-10-CM | POA: Diagnosis not present

## 2023-09-01 DIAGNOSIS — G894 Chronic pain syndrome: Secondary | ICD-10-CM | POA: Diagnosis not present

## 2023-09-07 ENCOUNTER — Telehealth: Payer: Medicare Other | Admitting: Internal Medicine

## 2023-09-07 ENCOUNTER — Ambulatory Visit: Payer: Medicare Other

## 2023-09-07 ENCOUNTER — Encounter: Payer: Self-pay | Admitting: Internal Medicine

## 2023-09-07 VITALS — Wt 194.0 lb

## 2023-09-07 DIAGNOSIS — U071 COVID-19: Secondary | ICD-10-CM

## 2023-09-07 MED ORDER — NIRMATRELVIR/RITONAVIR (PAXLOVID) TABLET (RENAL DOSING): 2.0000 | ORAL_TABLET | Freq: Two times a day (BID) | ORAL | 0 refills | Status: AC

## 2023-09-07 NOTE — Progress Notes (Signed)
 Virtual Visit via Video Note  I connected with Tammy [name] on 09/07/23 at  4:00 PM EST by a video enabled telemedicine application and verified that I am speaking with the correct person using two identifiers.  Location patient: home Location provider: work office Persons participating in the virtual visit: patient, provider  I discussed the limitations of evaluation and management by telemedicine and the availability of in person appointments. The patient expressed understanding and agreed to proceed.   HPI: She has told this virtual visit to inform us  that she tested positive for COVID yesterday.  She has been having symptoms for 2 days.  Symptoms include headache, postnasal drip, runny nose and congestion, body aches are significant in addition to subjective fevers and chills.   ROS: Negative unless indicated in HPI.  Past Medical History:  Diagnosis Date   Anxiety    Arthritis    knees and back   Bruised rib 01/18/2014   x-rayed   Chicken pox    DDD (degenerative disc disease), lumbar    Depression    mild, never on medication   Deviated septum    GERD (gastroesophageal reflux disease)    Heart murmur    told in 2012 benign   Hemorrhoids    High cholesterol    borderline, no meds   Hyperglycemia    borderline   Hypertension    high blood pressure readings, no meds   Osteoarthritis    Pre-diabetes    diet- controlled   Rib pain on left side 01/20/2014    xray done seen by MD 01/20/14 in [facility]    Scoliosis    Varicose vein     Past Surgical History:  Procedure Laterality Date   ABDOMINAL HYSTERECTOMY  1972   COLONOSCOPY  05/27/2013   DENTAL SURGERY  2007   left jaw bone graft, 2 implants on left, 2 implants on right   HEMORRHOID BANDING  06/03/13   HEMORROIDECTOMY  1975   INCISION AND DRAINAGE BREAST ABSCESS Bilateral 1966, 1967   MASS EXCISION N/A 04/22/2013   Procedure: EXCISION MULTPILE soft tissue masses and abnormal skin lesion left thigh  /left chest/ left posterior shoulder/ right flank ;  Surgeon: Vicenta DELENA Poli, MD;  Location: [facility];  Service: General;  Laterality: N/A;  multiple sites:  left thigh, left shoulder, right flank, right upper arm   OVARIAN CYST SURGERY Bilateral 1991   OVARIAN CYST SURGERY     SEPTOPLASTY Bilateral 12/16/2016   Procedure: SEPTOPLASTY;  Surgeon: Daniel Moccasin, MD;  Location: [facility];  Service: ENT;  Laterality: Bilateral;   TONSILLECTOMY AND ADENOIDECTOMY  1973   TOTAL KNEE ARTHROPLASTY Right 07/25/2013   Procedure: RIGHT TOTAL KNEE ARTHROPLASTY;  Surgeon: Dempsey LULLA Moan, MD;  Location: [facility];  Service: Orthopedics;  Laterality: Right;  Left knee cortisone injection   TOTAL KNEE ARTHROPLASTY Left 01/30/2014   Procedure: LEFT TOTAL KNEE ARTHROPLASTY;  Surgeon: Dempsey Moan LULLA, MD;  Location: [facility];  Service: Orthopedics;  Laterality: Left;   TUBAL LIGATION  1970    Family History  Problem Relation Age of Onset   Alcohol abuse Father    Heart disease Father    Arthritis Mother    Stroke Mother    Hypertension Mother    Diabetes Mother    Breast cancer Maternal Grandmother    Breast cancer Maternal Aunt    Colon cancer Maternal Uncle    Esophageal cancer Neg Hx    Stomach cancer  Neg Hx    Rectal cancer Neg Hx     SOCIAL HX:   reports that she has never smoked. She has never used smokeless tobacco. She reports current alcohol use. She reports that she does not use drugs.   Current Outpatient Medications:    amLODipine -valsartan  (EXFORGE ) 5-160 MG tablet, TAKE 1 TABLET BY MOUTH EVERY DAY, Disp: 90 tablet, Rfl: 3   aspirin  EC 81 MG tablet, Take 1 tablet (81 mg total) by mouth daily. Swallow whole., Disp: 30 tablet, Rfl: 12   atorvastatin  (LIPITOR) 40 MG tablet, TAKE 1 TABLET BY MOUTH EVERYDAY AT BEDTIME, Disp: 90 tablet, Rfl: 1   bisacodyl  (DULCOLAX) 5 MG EC tablet, Take 5 mg by mouth 2 (two) times daily as needed., Disp: , Rfl:    cyanocobalamin  (VITAMIN B12) 1000 MCG/ML  injection, inject 1 ml once a month, Disp: 6 mL, Rfl: 11   cyclobenzaprine  (FLEXERIL ) 10 MG tablet, Take 10 mg by mouth 3 (three) times daily as needed., Disp: , Rfl:    donepezil  (ARICEPT ) 5 MG tablet, Take 1 tablet (5 mg total) by mouth daily., Disp: 30 tablet, Rfl: 11   gabapentin (NEURONTIN) 100 MG capsule, at bedtime., Disp: , Rfl:    LINZESS 72 MCG capsule, As needed, Disp: , Rfl:    melatonin 3 MG TABS tablet, Take 3 mg by mouth at bedtime., Disp: , Rfl:    Multiple Vitamins-Minerals (MULTI-VITAMIN GUMMIES PO), Take by mouth., Disp: , Rfl:    Multiple Vitamins-Minerals (VITRUM 50+ SENIOR MULTI PO), Take by mouth., Disp: , Rfl:    nirmatrelvir /ritonavir , renal dosing, (PAXLOVID ) 10 x 150 MG & 10 x 100MG  TABS, Take 2 tablets by mouth 2 (two) times daily for 5 days. (Take nirmatrelvir  150 mg one tablet twice daily for 5 days and ritonavir  100 mg one tablet twice daily for 5 days) Patient GFR is 31, Disp: 20 tablet, Rfl: 0   oxyCODONE  (OXY IR/ROXICODONE ) 5 MG immediate release tablet, Take 1-2 tablets (5-10 mg total) by mouth every 3 (three) hours as needed for breakthrough pain. (Patient taking differently: Take 10 mg by mouth in the morning, at noon, in the evening, and at bedtime.), Disp: 80 tablet, Rfl: 0   oxymetazoline  (AFRIN) 0.05 % nasal spray, Place 1 spray into both nostrils 2 (two) times daily as needed., Disp: , Rfl:    pantoprazole  (PROTONIX ) 40 MG tablet, TAKE 1 TABLET BY MOUTH TWICE A DAY, Disp: 180 tablet, Rfl: 0   SYRINGE-NEEDLE, DISP, 3 ML (BD SAFETYGLIDE SYRINGE/NEEDLE) 25G X 1 3 ML MISC, Use to inject b12, Disp: 100 each, Rfl: 11  EXAM:   VITALS per patient if applicable: None reported  GENERAL: alert, oriented, appears well and in no acute distress  HEENT: atraumatic, conjunttiva clear, no obvious abnormalities on inspection of external nose and ears  NECK: normal movements of the head and neck  LUNGS: on inspection no signs of respiratory distress, breathing rate  appears normal, no obvious gross increased work of breathing, gasping or wheezing  CV: no obvious cyanosis  MS: moves all visible extremities without noticeable abnormality  PSYCH/NEURO: pleasant and cooperative, no obvious depression or anxiety, speech and thought processing grossly intact  ASSESSMENT AND PLAN:   COVID-19 - Plan: nirmatrelvir /ritonavir , renal dosing, (PAXLOVID ) 10 x 150 MG & 10 x 100MG  TABS  -She may also use OTC medications such as antihistamines, decongestants, pain relievers, guaifenesin. -We have discussed symptoms that would promote ED evaluation. -She knows to follow with us  if symptoms fail  to resolve.    I discussed the assessment and treatment plan with the patient. The patient was provided an opportunity to ask questions and all were answered. The patient agreed with the plan and demonstrated an understanding of the instructions.   The patient was advised to call back or seek an in-person evaluation if the symptoms worsen or if the condition fails to improve as anticipated.    Tully Theophilus Andrews, MD   Primary Care at [facility]

## 2023-09-09 ENCOUNTER — Ambulatory Visit: Payer: Medicare Other

## 2023-09-15 ENCOUNTER — Ambulatory Visit: Payer: Medicare Other

## 2023-09-15 ENCOUNTER — Ambulatory Visit (INDEPENDENT_AMBULATORY_CARE_PROVIDER_SITE_OTHER): Payer: Medicare Other | Admitting: Internal Medicine

## 2023-09-15 ENCOUNTER — Encounter: Payer: Self-pay | Admitting: Internal Medicine

## 2023-09-15 VITALS — BP 110/64 | HR 70 | Temp 97.5°F | Ht 60.5 in | Wt 190.1 lb

## 2023-09-15 DIAGNOSIS — E785 Hyperlipidemia, unspecified: Secondary | ICD-10-CM

## 2023-09-15 DIAGNOSIS — E538 Deficiency of other specified B group vitamins: Secondary | ICD-10-CM | POA: Diagnosis not present

## 2023-09-15 DIAGNOSIS — N1831 Chronic kidney disease, stage 3a: Secondary | ICD-10-CM | POA: Diagnosis not present

## 2023-09-15 DIAGNOSIS — R7303 Prediabetes: Secondary | ICD-10-CM | POA: Diagnosis not present

## 2023-09-15 DIAGNOSIS — Z0000 Encounter for general adult medical examination without abnormal findings: Secondary | ICD-10-CM

## 2023-09-15 DIAGNOSIS — I10 Essential (primary) hypertension: Secondary | ICD-10-CM

## 2023-09-15 DIAGNOSIS — Z111 Encounter for screening for respiratory tuberculosis: Secondary | ICD-10-CM

## 2023-09-15 LAB — COMPREHENSIVE METABOLIC PANEL
ALT: 34 U/L (ref 0–35)
AST: 29 U/L (ref 0–37)
Albumin: 4.6 g/dL (ref 3.5–5.2)
Alkaline Phosphatase: 89 U/L (ref 39–117)
BUN: 15 mg/dL (ref 6–23)
CO2: 27 meq/L (ref 19–32)
Calcium: 9.5 mg/dL (ref 8.4–10.5)
Chloride: 96 meq/L (ref 96–112)
Creatinine, Ser: 1.38 mg/dL — ABNORMAL HIGH (ref 0.40–1.20)
GFR: 35.4 mL/min — ABNORMAL LOW (ref >60.00)
Glucose, Bld: 108 mg/dL — ABNORMAL HIGH (ref 70–99)
Potassium: 4.5 meq/L (ref 3.5–5.1)
Sodium: 132 meq/L — ABNORMAL LOW (ref 135–145)
Total Bilirubin: 0.4 mg/dL (ref 0.2–1.2)
Total Protein: 7.8 g/dL (ref 6.0–8.3)

## 2023-09-15 LAB — HEMOGLOBIN A1C: Hgb A1c MFr Bld: 6.4 % (ref 4.6–6.5)

## 2023-09-15 NOTE — Progress Notes (Signed)
Established Patient Office Visit     CC/Reason for Visit: Annual preventive exam and subsequent Medicare wellness visit  HPI: Tammy [name] is a 83 y.o. female who is coming in today for the above mentioned reasons. Past Medical History is significant for: Hypertension, hyperlipidemia, chronic kidney disease stage III, impaired glucose tolerance, iron deficiency anemia, GERD, vitamin B12 deficiency.  She is feeling well without concerns or complaints.  She is recovering from a COVID infection she had earlier this month.  She has routine eye and dental care.  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  She is due for COVID, Tdap and RSV vaccines.  She elects to discontinue all future cancer screening due to age.   Past Medical/Surgical History: Past Medical History:  Diagnosis Date   Anxiety    Arthritis    knees and back   Bruised rib 01/18/2014   x-rayed   Chicken pox    DDD (degenerative disc disease), lumbar    Depression    mild, never on medication   Deviated septum    GERD (gastroesophageal reflux disease)    Heart murmur    told in 2012 benign   Hemorrhoids    High cholesterol    borderline, no meds   Hyperglycemia    borderline   Hypertension    high blood pressure readings, no meds   Osteoarthritis    Pre-diabetes    diet- controlled   Rib pain on left side 01/20/2014    xray done seen by MD 01/20/14 in [facility]    Scoliosis    Varicose vein     Past Surgical History:  Procedure Laterality Date   ABDOMINAL HYSTERECTOMY  1972   COLONOSCOPY  05/27/2013   DENTAL SURGERY  2007   left jaw bone graft, 2 implants on left, 2 implants on right   HEMORRHOID BANDING  06/03/13   HEMORROIDECTOMY  1975   INCISION AND DRAINAGE BREAST ABSCESS Bilateral 1966, 1967   MASS EXCISION N/A 04/22/2013   Procedure: EXCISION MULTPILE soft tissue masses and abnormal skin lesion left thigh /left chest/ left posterior shoulder/ right  flank ;  Surgeon: Shelly Rubenstein, MD;  Location: [facility];  Service: General;  Laterality: N/A;  multiple sites:  left thigh, left shoulder, right flank, right upper arm   OVARIAN CYST SURGERY Bilateral 1991   OVARIAN CYST SURGERY     SEPTOPLASTY Bilateral 12/16/2016   Procedure: SEPTOPLASTY;  Surgeon: Newman Pies, MD;  Location: [facility];  Service: ENT;  Laterality: Bilateral;   TONSILLECTOMY AND ADENOIDECTOMY  1973   TOTAL KNEE ARTHROPLASTY Right 07/25/2013   Procedure: RIGHT TOTAL KNEE ARTHROPLASTY;  Surgeon: Loanne Drilling, MD;  Location: [facility];  Service: Orthopedics;  Laterality: Right;  Left knee cortisone injection   TOTAL KNEE ARTHROPLASTY Left 01/30/2014   Procedure: LEFT TOTAL KNEE ARTHROPLASTY;  Surgeon: Loanne Drilling, MD;  Location: [facility];  Service: Orthopedics;  Laterality: Left;   TUBAL LIGATION  1970    Social History:  reports that she has never smoked. She has never used smokeless tobacco. She reports current alcohol use. She reports that she does not use drugs.  Allergies: Allergies  Allergen Reactions   Hydrocodone     Made her feel very strange, wired, restless  Made her feel very strange, wired, restless   Penicillins Rash    Family History:  Family History  Problem Relation Age of Onset  Alcohol abuse Father    Heart disease Father    Arthritis Mother    Stroke Mother    Hypertension Mother    Diabetes Mother    Breast cancer Maternal Grandmother    Breast cancer Maternal Aunt    Colon cancer Maternal Uncle    Esophageal cancer Neg Hx    Stomach cancer Neg Hx    Rectal cancer Neg Hx      Current Outpatient Medications:    amLODipine-valsartan (EXFORGE) 5-160 MG tablet, TAKE 1 TABLET BY MOUTH EVERY DAY, Disp: 90 tablet, Rfl: 3   aspirin EC 81 MG tablet, Take 1 tablet (81 mg total) by mouth daily. Swallow whole., Disp: 30 tablet, Rfl: 12   atorvastatin (LIPITOR) 40 MG tablet, TAKE 1 TABLET BY MOUTH EVERYDAY AT BEDTIME, Disp: 90  tablet, Rfl: 1   bisacodyl (DULCOLAX) 5 MG EC tablet, Take 5 mg by mouth 2 (two) times daily as needed., Disp: , Rfl:    cyanocobalamin (VITAMIN B12) 1000 MCG/ML injection, inject 1 ml once a month, Disp: 6 mL, Rfl: 11   cyclobenzaprine (FLEXERIL) 10 MG tablet, Take 10 mg by mouth 3 (three) times daily as needed., Disp: , Rfl:    donepezil (ARICEPT) 5 MG tablet, Take 1 tablet (5 mg total) by mouth daily., Disp: 30 tablet, Rfl: 11   gabapentin (NEURONTIN) 100 MG capsule, at bedtime., Disp: , Rfl:    LINZESS 72 MCG capsule, As needed, Disp: , Rfl:    melatonin 3 MG TABS tablet, Take 3 mg by mouth at bedtime., Disp: , Rfl:    Multiple Vitamins-Minerals (MULTI-VITAMIN GUMMIES PO), Take by mouth., Disp: , Rfl:    Multiple Vitamins-Minerals (VITRUM 50+ SENIOR MULTI PO), Take by mouth., Disp: , Rfl:    oxyCODONE (OXY IR/ROXICODONE) 5 MG immediate release tablet, Take 1-2 tablets (5-10 mg total) by mouth every 3 (three) hours as needed for breakthrough pain. (Patient taking differently: Take 10 mg by mouth in the morning, at noon, in the evening, and at bedtime.), Disp: 80 tablet, Rfl: 0   oxymetazoline (AFRIN) 0.05 % nasal spray, Place 1 spray into both nostrils 2 (two) times daily as needed., Disp: , Rfl:    pantoprazole (PROTONIX) 40 MG tablet, TAKE 1 TABLET BY MOUTH TWICE A DAY, Disp: 180 tablet, Rfl: 0   SYRINGE-NEEDLE, DISP, 3 ML (BD SAFETYGLIDE SYRINGE/NEEDLE) 25G X 1" 3 ML MISC, Use to inject b12, Disp: 100 each, Rfl: 11  Review of Systems:  Negative unless indicated in HPI.   Physical Exam: Vitals:   09/15/23 1403  BP: 110/64  Pulse: 70  Temp: (!) 97.5 F (36.4 C)  TempSrc: Oral  SpO2: 98%  Weight: 190 lb 1.6 oz (86.2 kg)  Height: 5' 0.5" (1.537 m)    Body mass index is 36.52 kg/m.   Physical Exam Vitals reviewed.  Constitutional:      General: She is not in acute distress.    Appearance: Normal appearance. She is not ill-appearing, toxic-appearing or diaphoretic.  HENT:      Head: Normocephalic.     Right Ear: Tympanic membrane, ear canal and external ear normal. There is no impacted cerumen.     Left Ear: Tympanic membrane, ear canal and external ear normal. There is no impacted cerumen.     Nose: Nose normal.     Mouth/Throat:     Mouth: Mucous membranes are moist.     Pharynx: Oropharynx is clear. No oropharyngeal exudate or posterior oropharyngeal erythema.  Eyes:  General: No scleral icterus.       Right eye: No discharge.        Left eye: No discharge.     Conjunctiva/sclera: Conjunctivae normal.     Pupils: Pupils are equal, round, and reactive to light.  Neck:     Vascular: No carotid bruit.  Cardiovascular:     Rate and Rhythm: Normal rate and regular rhythm.     Pulses: Normal pulses.     Heart sounds: Normal heart sounds.  Pulmonary:     Effort: Pulmonary effort is normal. No respiratory distress.     Breath sounds: Normal breath sounds.  Abdominal:     General: Abdomen is flat. Bowel sounds are normal.     Palpations: Abdomen is soft.  Musculoskeletal:        General: Normal range of motion.     Cervical back: Normal range of motion.  Skin:    General: Skin is warm and dry.  Neurological:     General: No focal deficit present.     Mental Status: She is alert and oriented to person, place, and time. Mental status is at baseline.  Psychiatric:        Mood and Affect: Mood normal.        Behavior: Behavior normal.        Thought Content: Thought content normal.        Judgment: Judgment normal.    Subsequent Medicare wellness visit   1. Risk factors, based on past  M,S,F -     2.  Physical activities: Dietary issues and exercise activities discussed:      3.  Depression/mood:  Flowsheet Row Office Visit from 09/15/2023 in [facility] Total Score 5        4.  ADL's:    09/15/2023    2:03 PM 09/14/2023    5:54 PM  In your present state of health, do you have any difficulty  performing the following activities:  Hearing? 0 0  Vision? 0 0  Difficulty concentrating or making decisions? 0 0  Walking or climbing stairs? 0 0  Dressing or bathing? 0 0  Doing errands, shopping? 0 0  Preparing Food and eating ? N N  Using the Toilet? N N  In the past six months, have you accidently leaked urine?  N  Do you have problems with loss of bowel control? N N  Managing your Medications? N N  Managing your Finances? N N  Housekeeping or managing your Housekeeping? N N     5.  Fall risk:     05/14/2023    2:48 PM 06/18/2023    2:18 PM 09/07/2023   11:12 AM 09/14/2023    5:54 PM 09/15/2023    2:05 PM  Fall Risk  Falls in the past year? 0 0 0 0 0  Was there an injury with Fall? 0 0 0 0 0  Fall Risk Category Calculator 0 0 0 0  0  Fall risk Follow up Falls evaluation completed Falls evaluation completed Falls evaluation completed  Falls evaluation completed     Patient-reported     6.  Home safety: No problems identified   7.  Height weight, and visual acuity: height and weight as above, vision/hearing: Vision Screening   Right eye Left eye Both eyes  Without correction 20/30 20/30 20/30   With correction        8.  Counseling: Counseling given: Not Answered  9. Lab orders based on risk factors: Laboratory update will be reviewed   10. Cognitive assessment:    05/14/2023    6:00 PM  MMSE - Mini Mental State Exam  Orientation to time 5  Orientation to Place 5  Registration 3  Attention/ Calculation 5  Recall 1  Language- name 2 objects 2  Language- repeat 1  Language- follow 3 step command 3  Language- read & follow direction 1  Write a sentence 1  Copy design 1  Total score 28      10/02/2021    2:00 PM  Montreal Cognitive Assessment   Visuospatial/ Executive (0/5) 4  Naming (0/3) 2  Attention: Read list of digits (0/2) 2  Attention: Read list of letters (0/1) 1  Attention: Serial 7 subtraction starting at 100 (0/3) 3  Language: Repeat  phrase (0/2) 2  Language : Fluency (0/1) 1  Abstraction (0/2) 2  Delayed Recall (0/5) 5  Orientation (0/6) 6  Total 28  Adjusted Score (based on education) 28      09/15/2023    2:05 PM  6CIT Screen  What Year? 0 points  What month? 0 points  What time? 0 points  Count back from 20 0 points  Months in reverse 2 points  Repeat phrase 0 points  Total Score 2 points     11. Screening: Patient provided with a written and personalized 5-10 year screening schedule in the AVS. Health Maintenance  Topic Date Due   DTaP/Tdap/Td vaccine (2 - Td or Tdap) 01/23/2021   Zoster (Shingles) Vaccine (2 of 2) 03/12/2023   COVID-19 Vaccine (6 - 2024-25 season) 04/26/2023   Medicare Annual Wellness Visit  09/14/2024   Pneumonia Vaccine  Completed   Flu Shot  Completed   DEXA scan (bone density measurement)  Completed   HPV Vaccine  Aged Out    12. Provider List Update: Patient Care Team    Relationship Specialty Notifications Start End  [name], Limmie Patricia, MD PCP - General Internal Medicine  10/28/19   Chrystie Nose, MD PCP - Cardiology Cardiology  10/17/22      13. Advance Directives: Does Patient Have a Medical Advance Directive?: Yes Type of Advance Directive: Healthcare Power of Attorney, Living will, Out of facility DNR (pink MOST or yellow form) Does patient want to make changes to medical advance directive?: No - Patient declined Copy of Healthcare Power of Attorney in Chart?: No - copy requested  14. Opioids: Patient is not on any opioid prescriptions and has no risk factors for a substance use disorder.   15.   Goals      Weight (lb) < 200 lb (90.7 kg)         I have personally reviewed and noted the following in the patient's chart:   Medical and social history Use of alcohol, tobacco or illicit drugs  Current medications and supplements Functional ability and status Nutritional status Physical activity Advanced directives List of other  physicians Hospitalizations, surgeries, and ER visits in previous 12 months Vitals Screenings to include cognitive, depression, and falls Referrals and appointments  In addition, I have reviewed and discussed with patient certain preventive protocols, quality metrics, and best practice recommendations. A written personalized care plan for preventive services as well as general preventive health recommendations were provided to patient.   Impression and Plan:  Medicare annual wellness visit, subsequent  Vitamin B12 deficiency  Prediabetes -     Hemoglobin A1c; Future  Essential hypertension,  benign  Hyperlipidemia, mild  PPD screening test -     QuantiFERON-TB Gold Plus  Stage 3a chronic kidney disease (HCC) -     Comprehensive metabolic panel; Future   -Recommend routine eye and dental care. -Healthy lifestyle discussed in detail. -Labs to be updated today. -Prostate cancer screening: N/A Health Maintenance  Topic Date Due   DTaP/Tdap/Td vaccine (2 - Td or Tdap) 01/23/2021   Zoster (Shingles) Vaccine (2 of 2) 03/12/2023   COVID-19 Vaccine (6 - 2024-25 season) 04/26/2023   Medicare Annual Wellness Visit  09/14/2024   Pneumonia Vaccine  Completed   Flu Shot  Completed   DEXA scan (bone density measurement)  Completed   HPV Vaccine  Aged Out     -Advised to update Tdap, COVID, RSV at pharmacy. -QuantiFERON gold per request.    Chaya Jan, MD [facility]

## 2023-09-15 NOTE — Patient Instructions (Signed)
-  Nice seeing you today!!  -Lab work today; will notify you once results are available.  -Pending vaccines: Tdap, COVID, RSV.

## 2023-09-17 ENCOUNTER — Other Ambulatory Visit: Payer: Self-pay | Admitting: Internal Medicine

## 2023-09-17 DIAGNOSIS — K219 Gastro-esophageal reflux disease without esophagitis: Secondary | ICD-10-CM

## 2023-09-18 LAB — QUANTIFERON-TB GOLD PLUS
Mitogen-NIL: >10 [IU]/mL
NIL: 0.06 [IU]/mL
QuantiFERON-TB Gold Plus: NEGATIVE
TB1-NIL: <0 [IU]/mL
TB2-NIL: 0 [IU]/mL

## 2023-09-29 ENCOUNTER — Encounter: Payer: Self-pay | Admitting: Internal Medicine

## 2023-11-04 DIAGNOSIS — E119 Type 2 diabetes mellitus without complications: Secondary | ICD-10-CM | POA: Diagnosis not present

## 2023-11-04 LAB — HM DIABETES EYE EXAM

## 2023-11-10 DIAGNOSIS — H25813 Combined forms of age-related cataract, bilateral: Secondary | ICD-10-CM | POA: Diagnosis not present

## 2023-11-10 DIAGNOSIS — H25812 Combined forms of age-related cataract, left eye: Secondary | ICD-10-CM | POA: Diagnosis not present

## 2023-11-10 DIAGNOSIS — Z01818 Encounter for other preprocedural examination: Secondary | ICD-10-CM | POA: Diagnosis not present

## 2023-11-12 ENCOUNTER — Encounter: Payer: Self-pay | Admitting: Physician Assistant

## 2023-11-12 ENCOUNTER — Ambulatory Visit (INDEPENDENT_AMBULATORY_CARE_PROVIDER_SITE_OTHER): Payer: Medicare Other | Admitting: Physician Assistant

## 2023-11-12 VITALS — BP 158/82 | HR 84 | Resp 18 | Ht 60.0 in | Wt 191.0 lb

## 2023-11-12 DIAGNOSIS — G3184 Mild cognitive impairment, so stated: Secondary | ICD-10-CM | POA: Diagnosis not present

## 2023-11-12 MED ORDER — DONEPEZIL HCL 5 MG PO TABS
5.0000 mg | ORAL_TABLET | Freq: Every day | ORAL | 3 refills | Status: AC
Start: 2023-11-12 — End: ?

## 2023-11-12 NOTE — Progress Notes (Signed)
 Assessment/Plan:   Memory Impairment of unclear etiology  Tammy [name] is a very pleasant 83 y.o. RH female with a history of presenting today in follow-up for evaluation of memory loss. Patient is on donepezil 5 mg daily, tolerating well. MMSE today is 29/30 Patient is able to participate on ADLs and to drive without difficulties. She is now working part time. Mood is good.     Recommendations:   Follow up in 1 year  Continue donepezil 5 mg daily, side effects discussed Continue B12 supplements Recommend good control of cardiovascular risk factors. Patient informed of elevated BP Continue to control mood as per PCP    Subjective:   This patient is her alone. Previous records as well as any outside records available were reviewed prior to todays visit.   Patient was last seen on 05/14/2023 with a MoCA 28/30     Any changes in memory since last visit? "I am getting better, I don't get frustrated like before".  She continues to have some difficulty remembering recent conversations and names, but the long-term memory is good. She likes word-finding. Enjoys watching Jeopardy and "Common Knowledge".  She is working with [employer] part time.  repeats oneself?  Endorsed Disoriented when walking into a room?  Patient denies.    Misplacing objects?  Patient denies   Wandering behavior?   denies   Any personality changes since last visit? denies   Any worsening depression?: denies.   Hallucinations or paranoia?  denies   Seizures?   denies    Any sleep changes? 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 Denies vivid dreams, REM behavior or sleepwalking . Started melatonin recently Sleep apnea?   Denies.    Any hygiene concerns?   Denies.   Independent of bathing and dressing?  Endorsed  Does the patient needs help with medications? Patient is in charge, occasionally she may forget a dose "but never the memory pill"   Who  is in charge of the finances?  Patient is in charge.     Any changes in appetite?  Denies. Eats her biggest meal at noon     Patient have trouble swallowing?  denies   Does the patient cook? No      Any headaches?    denies   Vision changes? She is to have cataract surgery soon. Chronic pain?  denies   Ambulates with difficulty?    Denies.    Recent falls or head injuries?    Denies.      Unilateral weakness, numbness or tingling?   Denies.   Any tremors?  Denies.   Any anosmia?    Denies.   Any incontinence of urine?  Stress incontinence, wears pads   Any bowel dysfunction?  denies      Patient lives alone in a [place] community  Does the patient drive?  Yes, denies any issues    Initial Visit 10/02/21 The patient is seen in neurologic consultation at the request of Tammy [name], Tammy [name]* for the evaluation of memory.  The patient is accompanied by her daughter who supplements the history. This is a delightful 83 y.o. 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  She reports that she was caring for an old lady and had to begin writing and at least the findings on the things to do, because she was having difficulty remembering them.  She also was feeling much more tired than prior.  "I used to be much heavier, I decided to lose weight, lost 25 pounds in a diet and I still was tired and my memory was not good ".  She states that she could not compute, could not repeat what is was being said.  This was troubling to her, causing some depression.  She denies any difficulties with long-term memory.  She lives alone, and is fairly independent.  She does write everything since that incident.  She does her own cooking, cleaning and laundry, but she becomes very short of breath when going in heels, which was felt to be due to iron deficiency.  She is not disoriented when walking into her room.  She denies leaving objects in unusual places.  She  ambulates without significant difficulties.  She had a recent fall which brought her to the ED recently, after her toe was caught in the pillow and "took a nose dive hurting the left knee and the right shoulder ".  She had a nosebleed, but did not have any fractures.  She denies wandering off.  She continues to drive short distances, if she has to do a long distance, she uses a GPS.  She denies irritability.  She does not do crossword puzzles or word finding often.  She does like to watch Jeopardy.  Inform her night nurse, she feels that her sleep cycle has always been different.  She goes to bed at 11 PM, and she may be waking up an hour or 2 later.  She never had a sleep study.  She states that she could be up again at 4 in the morning.  She does feel slightly tired when waking up, and denies sleepwalking, hallucinations or paranoia.  When she sleeps she has vivid dreams.  There are no hygiene concerns, she is independent of bathing and dressing, and she puts the medications in a pillbox without missing doses.  Her daughter manages the finances.  Her appetite is good, denies trouble swallowing.  She cooks, randomly, she may have seldom, she may have left the stove on accidentally.  She denies any headaches, double vision, dizziness, focal numbness or tingling, unilateral weakness, tremors or anosmia.  She complains of her feet being very cold.  No history of seizures.  Denies any urine incontinence, retention, constipation or diarrhea, sleep apnea, alcohol or tobacco history, family history remarkable for mother with dementia.   CT of the head without contrast 09/16/2018 no acute intracranial pathology.2. Moderate chronic white matter microangiopathy.  Past Medical History:  Diagnosis Date   Anxiety    Arthritis    knees and back   Bruised rib 01/18/2014   x-rayed   Chicken pox    DDD (degenerative disc disease), lumbar    Depression    mild, never on medication   Deviated septum    GERD  (gastroesophageal reflux disease)    Heart murmur    told in 2012 benign   Hemorrhoids    High cholesterol    borderline, no meds   Hyperglycemia    borderline   Hypertension    high blood pressure readings, no meds   Osteoarthritis    Pre-diabetes    diet- controlled   Rib pain on left side 01/20/2014    xray done seen by MD 01/20/14 in [facility]    Scoliosis    Varicose vein      Past Surgical History:  Procedure Laterality Date   ABDOMINAL  HYSTERECTOMY  1972   COLONOSCOPY  05/27/2013   DENTAL SURGERY  2007   left jaw bone graft, 2 implants on left, 2 implants on right   HEMORRHOID BANDING  06/03/13   HEMORROIDECTOMY  1975   INCISION AND DRAINAGE BREAST ABSCESS Bilateral 1966, 1967   MASS EXCISION N/A 04/22/2013   Procedure: EXCISION MULTPILE soft tissue masses and abnormal skin lesion left thigh /left chest/ left posterior shoulder/ right flank ;  Surgeon: Shelly Rubenstein, MD;  Location: [facility];  Service: General;  Laterality: N/A;  multiple sites:  left thigh, left shoulder, right flank, right upper arm   OVARIAN CYST SURGERY Bilateral 1991   OVARIAN CYST SURGERY     SEPTOPLASTY Bilateral 12/16/2016   Procedure: SEPTOPLASTY;  Surgeon: Newman Pies, MD;  Location: [facility];  Service: ENT;  Laterality: Bilateral;   TONSILLECTOMY AND ADENOIDECTOMY  1973   TOTAL KNEE ARTHROPLASTY Right 07/25/2013   Procedure: RIGHT TOTAL KNEE ARTHROPLASTY;  Surgeon: Loanne Drilling, MD;  Location: [facility];  Service: Orthopedics;  Laterality: Right;  Left knee cortisone injection   TOTAL KNEE ARTHROPLASTY Left 01/30/2014   Procedure: LEFT TOTAL KNEE ARTHROPLASTY;  Surgeon: Loanne Drilling, MD;  Location: [facility];  Service: Orthopedics;  Laterality: Left;   TUBAL LIGATION  1970     PREVIOUS MEDICATIONS:   CURRENT MEDICATIONS:  Outpatient Encounter Medications as of 11/12/2023  Medication Sig   amLODipine-valsartan (EXFORGE) 5-160 MG tablet TAKE 1 TABLET BY MOUTH EVERY DAY   aspirin EC 81  MG tablet Take 1 tablet (81 mg total) by mouth daily. Swallow whole.   atorvastatin (LIPITOR) 40 MG tablet TAKE 1 TABLET BY MOUTH EVERYDAY AT BEDTIME   bisacodyl (DULCOLAX) 5 MG EC tablet Take 5 mg by mouth 2 (two) times daily as needed.   cyanocobalamin (VITAMIN B12) 1000 MCG/ML injection inject 1 ml once a month   cyclobenzaprine (FLEXERIL) 10 MG tablet Take 10 mg by mouth 3 (three) times daily as needed.   gabapentin (NEURONTIN) 100 MG capsule at bedtime.   LINZESS 72 MCG capsule As needed   melatonin 3 MG TABS tablet Take 3 mg by mouth at bedtime.   Multiple Vitamins-Minerals (MULTI-VITAMIN GUMMIES PO) Take by mouth.   Multiple Vitamins-Minerals (VITRUM 50+ SENIOR MULTI PO) Take by mouth.   oxyCODONE (OXY IR/ROXICODONE) 5 MG immediate release tablet Take 1-2 tablets (5-10 mg total) by mouth every 3 (three) hours as needed for breakthrough pain. (Patient taking differently: Take 10 mg by mouth in the morning, at noon, in the evening, and at bedtime.)   oxymetazoline (AFRIN) 0.05 % nasal spray Place 1 spray into both nostrils 2 (two) times daily as needed.   pantoprazole (PROTONIX) 40 MG tablet TAKE 1 TABLET BY MOUTH TWICE A DAY   SYRINGE-NEEDLE, DISP, 3 ML (BD SAFETYGLIDE SYRINGE/NEEDLE) 25G X 1" 3 ML MISC Use to inject b12   [DISCONTINUED] donepezil (ARICEPT) 5 MG tablet Take 1 tablet (5 mg total) by mouth daily.   donepezil (ARICEPT) 5 MG tablet Take 1 tablet (5 mg total) by mouth at bedtime.   No facility-administered encounter medications on file as of 11/12/2023.     Objective:     PHYSICAL EXAMINATION:    VITALS:   Vitals:   11/12/23 1245  BP: (!) 158/82  Pulse: 84  Resp: 18  SpO2: 98%  Weight: 191 lb (86.6 kg)  Height: 5' (1.524 m)    GEN:  The patient appears stated age and is in  NAD. HEENT:  Normocephalic, atraumatic.   Neurological examination:  General: NAD, well-groomed, appears stated age. Orientation: The patient is alert. Oriented to person, place and not  the month, but correct year and date Cranial nerves: There is good facial symmetry. The speech is fluent and clear. No aphasia or dysarthria. Fund of knowledge is appropriate. Recent memory impaired and remote memory is normal.  Attention and concentration are normal.  Able to name objects and repeat phrases.  Hearing is intact to conversational tone.   Delayed recall 3/3 Sensation: Sensation is intact to light touch throughout Motor: Strength is at least antigravity x4. DTR's 2/4 in UE/LE      10/02/2021    2:00 PM  Montreal Cognitive Assessment   Visuospatial/ Executive (0/5) 4  Naming (0/3) 2  Attention: Read list of digits (0/2) 2  Attention: Read list of letters (0/1) 1  Attention: Serial 7 subtraction starting at 100 (0/3) 3  Language: Repeat phrase (0/2) 2  Language : Fluency (0/1) 1  Abstraction (0/2) 2  Delayed Recall (0/5) 5  Orientation (0/6) 6  Total 28  Adjusted Score (based on education) 28       11/12/2023    1:00 PM 05/14/2023    6:00 PM  MMSE - Mini Mental State Exam  Orientation to time 4 5  Orientation to Place 5 5  Registration 3 3  Attention/ Calculation 5 5  Recall 3 1  Language- name 2 objects 2 2  Language- repeat 1 1  Language- follow 3 step command 3 3  Language- read & follow direction 1 1  Write a sentence 1 1  Copy design 1 1  Total score 29 28       Movement examination: Tone: There is normal tone in the UE/LE Abnormal movements:  no tremor.  No myoclonus.  No asterixis.   Coordination:  There is no decremation with RAM's. Normal finger to nose  Gait and Station: The patient has no difficulty arising out of a deep-seated chair without the use of the hands. The patient's stride length is good.  Gait is cautious and narrow.   Thank you for allowing [place] the opportunity to participate in the care of this nice patient. Please do not hesitate to contact us for any questions or concerns.   Total time spent on today's visit was 30 minutes dedicated  to this patient today, preparing to see patient, examining the patient, ordering tests and/or medications and counseling the patient, documenting clinical information in the EHR or other health record, independently interpreting results and communicating results to the patient/family, discussing treatment and goals, answering patient's questions and coordinating care.  Cc:  Tammy [name], Limmie Patricia, MD  Marlowe Kays 11/12/2023 1:33 PM

## 2023-11-12 NOTE — Patient Instructions (Addendum)
 It was a pleasure to see you today at our office.   Recommendations:  Replenish iron and B12 Continue Donepezil 5mg  nightly.  Follow up in 1 year   RECOMMENDATIONS FOR ALL PATIENTS WITH MEMORY PROBLEMS: 1. Continue to exercise (Recommend 30 minutes of walking everyday, or 3 hours every week) 2. Increase social interactions - continue going to [place] and enjoy social gatherings with friends and family 3. Eat healthy, avoid fried foods and eat more fruits and vegetables 4. Maintain adequate blood pressure, blood sugar, and blood cholesterol level. Reducing the risk of stroke and cardiovascular disease also helps promoting better memory. 5. Avoid stressful situations. Live a simple life and avoid aggravations. Organize your time and prepare for the next day in anticipation. 6. Sleep well, avoid any interruptions of sleep and avoid any distractions in the bedroom that may interfere with adequate sleep quality 7. Avoid sugar, avoid sweets as there is a strong link between excessive sugar intake, diabetes, and cognitive impairment We discussed the Mediterranean diet, which has been shown to help patients reduce the risk of progressive memory disorders and reduces cardiovascular risk. This includes eating fish, eat fruits and green leafy vegetables, nuts like almonds and hazelnuts, walnuts, and also use olive oil. Avoid fast foods and fried foods as much as possible. Avoid sweets and sugar as sugar use has been linked to worsening of memory function.  There is always a concern of gradual progression of memory problems. If this is the case, then we may need to adjust level of care according to patient needs. Support, both to the patient and caregiver, should then be put into place.     FALL PRECAUTIONS: Be cautious when walking. Scan the area for obstacles that may increase the risk of trips and falls. When getting up in the mornings, sit up at the edge of the bed for a few minutes before getting out  of bed. Consider elevating the bed at the head end to avoid drop of blood pressure when getting up. Walk always in a well-lit room (use night lights in the walls). Avoid area rugs or power cords from appliances in the middle of the walkways. Use a walker or a cane if necessary and consider physical therapy for balance exercise. Get your eyesight checked regularly.  FINANCIAL OVERSIGHT: Supervision, especially oversight when making financial decisions or transactions is also recommended.  HOME SAFETY: Consider the safety of the kitchen when operating appliances like stoves, microwave oven, and blender. Consider having supervision and share cooking responsibilities until no longer able to participate in those. Accidents with firearms and other hazards in the house should be identified and addressed as well.   ABILITY TO BE LEFT ALONE: If patient is unable to contact 911 operator, consider using LifeLine, or when the need is there, arrange for someone to stay with patients. Smoking is a fire hazard, consider supervision or cessation. Risk of wandering should be assessed by caregiver and if detected at any point, supervision and safe proof recommendations should be instituted.  MEDICATION SUPERVISION: Inability to self-administer medication needs to be constantly addressed. Implement a mechanism to ensure safe administration of the medications.   DRIVING: Regarding driving, in patients with progressive memory problems, driving will be impaired. We advise to have someone else do the driving if trouble finding directions or if minor accidents are reported. Independent driving assessment is available to determine safety of driving.  Consider [facility]  [address] [address], [place] [address] [phone]  Hours  of Operation Mondays to Thursdays: 8 am to 8 pm,Fridays: 9 am to 8 pm, Saturdays: 9 am to 1 pm Sundays:  Closed  [email or web address]://[email or web address].gov/departments/parks-recreation/active-adults-50/[facility]-active-adult-center   If you are interested in the driving assessment, you can contact the following:  [facility] [employer] in [place] [phone]  [facility] [phone]  [facility] [phone] or [phone]    Mediterranean Diet A Mediterranean diet refers to food and lifestyle choices that are based on the traditions of countries located on the [employer]. This way of eating has been shown to help prevent certain conditions and improve outcomes for people who have chronic diseases, like kidney disease and heart disease. What are tips for following this plan? Lifestyle  Cook and eat meals together with your family, when possible. Drink enough fluid to keep your urine clear or pale yellow. Be physically active every day. This includes: Aerobic exercise like running or swimming. Leisure activities like gardening, walking, or housework. Get 7-8 hours of sleep each night. If recommended by your health care provider, drink red wine in moderation. This means 1 glass a day for nonpregnant women and 2 glasses a day for men. A glass of wine equals 5 oz (150 mL). Reading food labels  Check the serving size of packaged foods. For foods such as rice and pasta, the serving size refers to the amount of cooked product, not dry. Check the total fat in packaged foods. Avoid foods that have saturated fat or trans fats. Check the ingredients list for added sugars, such as corn syrup. Shopping  At the grocery store, buy most of your food from the areas near the walls of the store. This includes: Fresh fruits and vegetables (produce). Grains, beans, nuts, and seeds. Some of these may be available in unpackaged forms or large amounts (in bulk). Fresh seafood. Poultry and eggs. Low-fat dairy products. Buy whole ingredients instead  of prepackaged foods. Buy fresh fruits and vegetables in-season from local farmers markets. Buy frozen fruits and vegetables in resealable bags. If you do not have access to quality fresh seafood, buy precooked frozen shrimp or canned fish, such as tuna, salmon, or sardines. Buy small amounts of raw or cooked vegetables, salads, or olives from the deli or salad bar at your store. Stock your pantry so you always have certain foods on hand, such as olive oil, canned tuna, canned tomatoes, rice, pasta, and beans. Cooking  Cook foods with extra-virgin olive oil instead of using butter or other vegetable oils. Have meat as a side dish, and have vegetables or grains as your main dish. This means having meat in small portions or adding small amounts of meat to foods like pasta or stew. Use beans or vegetables instead of meat in common dishes like chili or lasagna. Experiment with different cooking methods. Try roasting or broiling vegetables instead of steaming or sauteing them. Add frozen vegetables to soups, stews, pasta, or rice. Add nuts or seeds for added healthy fat at each meal. You can add these to yogurt, salads, or vegetable dishes. Marinate fish or vegetables using olive oil, lemon juice, garlic, and fresh herbs. Meal planning  Plan to eat 1 vegetarian meal one day each week. Try to work up to 2 vegetarian meals, if possible. Eat seafood 2 or more times a week. Have healthy snacks readily available, such as: Vegetable sticks with hummus. Greek yogurt. Fruit and nut trail mix. Eat balanced meals throughout the week. This includes: Fruit: 2-3 servings a  day Vegetables: 4-5 servings a day Low-fat dairy: 2 servings a day Fish, poultry, or lean meat: 1 serving a day Beans and legumes: 2 or more servings a week Nuts and seeds: 1-2 servings a day Whole grains: 6-8 servings a day Extra-virgin olive oil: 3-4 servings a day Limit red meat and sweets to only a few servings a month What are  my food choices? Mediterranean diet Recommended Grains: Whole-grain pasta. Brown rice. Bulgar wheat. Polenta. Couscous. Whole-wheat bread. Orpah Cobb. Vegetables: Artichokes. Beets. Broccoli. Cabbage. Carrots. Eggplant. Green beans. Chard. Kale. Spinach. Onions. Leeks. Peas. Squash. Tomatoes. Peppers. Radishes. Fruits: Apples. Apricots. Avocado. Berries. Bananas. Cherries. Dates. Figs. Grapes. Lemons. Melon. Oranges. Peaches. Plums. Pomegranate. Meats and other protein foods: Beans. Almonds. Sunflower seeds. Pine nuts. Peanuts. Cod. Salmon. Scallops. Shrimp. Tuna. Tilapia. Clams. Oysters. Eggs. Dairy: Low-fat milk. Cheese. Greek yogurt. Beverages: Water. Red wine. Herbal tea. Fats and oils: Extra virgin olive oil. Avocado oil. Grape seed oil. Sweets and desserts: [place] yogurt with honey. Baked apples. Poached pears. Trail mix. Seasoning and other foods: Basil. Cilantro. Coriander. Cumin. Mint. Parsley. Sage. Rosemary. Tarragon. Garlic. Oregano. Thyme. Pepper. Balsalmic vinegar. Tahini. Hummus. Tomato sauce. Olives. Mushrooms. Limit these Grains: Prepackaged pasta or rice dishes. Prepackaged cereal with added sugar. Vegetables: Deep fried potatoes (french fries). Fruits: Fruit canned in syrup. Meats and other protein foods: Beef. Pork. Lamb. Poultry with skin. Hot dogs. Tomasa Blase. Dairy: Ice cream. Sour cream. Whole milk. Beverages: Juice. Sugar-sweetened soft drinks. Beer. Liquor and spirits. Fats and oils: Butter. Canola oil. Vegetable oil. Beef fat (tallow). Lard. Sweets and desserts: Cookies. Cakes. Pies. Candy. Seasoning and other foods: Mayonnaise. Premade sauces and marinades. The items listed may not be a complete list. Talk with your dietitian about what dietary choices are right for you. Summary The Mediterranean diet includes both food and lifestyle choices. Eat a variety of fresh fruits and vegetables, beans, nuts, seeds, and whole grains. Limit the amount of red meat and sweets  that you eat. Talk with your health care provider about whether it is safe for you to drink red wine in moderation. This means 1 glass a day for nonpregnant women and 2 glasses a day for men. A glass of wine equals 5 oz (150 mL). This information is not intended to replace advice given to you by your health care provider. Make sure you discuss any questions you have with your health care provider. Document Released: 04/03/2016 Document Revised: 05/06/2016 Document Reviewed: 04/03/2016 Elsevier Interactive Patient Education  2017 [employer].  .cre

## 2023-11-26 DIAGNOSIS — H2512 Age-related nuclear cataract, left eye: Secondary | ICD-10-CM | POA: Diagnosis not present

## 2023-11-26 DIAGNOSIS — H25812 Combined forms of age-related cataract, left eye: Secondary | ICD-10-CM | POA: Diagnosis not present

## 2023-11-26 DIAGNOSIS — I10 Essential (primary) hypertension: Secondary | ICD-10-CM | POA: Diagnosis not present

## 2023-12-15 ENCOUNTER — Other Ambulatory Visit: Payer: Self-pay

## 2023-12-15 ENCOUNTER — Emergency Department (HOSPITAL_BASED_OUTPATIENT_CLINIC_OR_DEPARTMENT_OTHER): Admitting: Radiology

## 2023-12-15 ENCOUNTER — Encounter (HOSPITAL_BASED_OUTPATIENT_CLINIC_OR_DEPARTMENT_OTHER): Payer: Self-pay | Admitting: Emergency Medicine

## 2023-12-15 ENCOUNTER — Emergency Department (HOSPITAL_BASED_OUTPATIENT_CLINIC_OR_DEPARTMENT_OTHER)
Admission: EM | Admit: 2023-12-15 | Discharge: 2023-12-15 | Disposition: A | Discharge status: Home / Self Care | Attending: Emergency Medicine | Admitting: Emergency Medicine

## 2023-12-15 DIAGNOSIS — R0981 Nasal congestion: Secondary | ICD-10-CM | POA: Insufficient documentation

## 2023-12-15 DIAGNOSIS — R051 Acute cough: Secondary | ICD-10-CM | POA: Diagnosis not present

## 2023-12-15 DIAGNOSIS — R509 Fever, unspecified: Secondary | ICD-10-CM | POA: Diagnosis not present

## 2023-12-15 DIAGNOSIS — N183 Chronic kidney disease, stage 3 unspecified: Secondary | ICD-10-CM | POA: Diagnosis not present

## 2023-12-15 DIAGNOSIS — Z7982 Long term (current) use of aspirin: Secondary | ICD-10-CM | POA: Insufficient documentation

## 2023-12-15 DIAGNOSIS — H9203 Otalgia, bilateral: Secondary | ICD-10-CM | POA: Diagnosis not present

## 2023-12-15 DIAGNOSIS — R059 Cough, unspecified: Secondary | ICD-10-CM | POA: Diagnosis not present

## 2023-12-15 DIAGNOSIS — J449 Chronic obstructive pulmonary disease, unspecified: Secondary | ICD-10-CM | POA: Diagnosis not present

## 2023-12-15 LAB — RESP PANEL BY RT-PCR (RSV, FLU A&B, COVID)  RVPGX2
Influenza A by PCR: NEGATIVE
Influenza B by PCR: NEGATIVE
Resp Syncytial Virus by PCR: NEGATIVE
SARS Coronavirus 2 by RT PCR: NEGATIVE

## 2023-12-15 MED ORDER — ALBUTEROL SULFATE HFA 108 (90 BASE) MCG/ACT IN AERS
1.0000 | INHALATION_SPRAY | Freq: Four times a day (QID) | RESPIRATORY_TRACT | Status: DC | PRN
  Administered 2023-12-15: 2 via RESPIRATORY_TRACT
  Filled 2023-12-15: qty 6.7

## 2023-12-15 MED ORDER — BENZONATATE 100 MG PO CAPS: 100.0000 mg | ORAL_CAPSULE | Freq: Three times a day (TID) | ORAL | 0 refills | Status: DC

## 2023-12-15 MED ORDER — ACETAMINOPHEN 325 MG PO TABS
650.0000 mg | ORAL_TABLET | Freq: Once | ORAL | Status: AC
  Administered 2023-12-15: 650 mg via ORAL
  Filled 2023-12-15: qty 2

## 2023-12-15 NOTE — ED Triage Notes (Signed)
 Cough x 3 days Yellowish phlegm  Denies sob or chest pain Ear pain

## 2023-12-15 NOTE — Discharge Instructions (Addendum)
 X-ray shows no active cardiopulmonary disease and COPD changes hyperinflation both lung bases.  Please take 650mg  of Tylenol  every 6 hours for fever. You can try OTC Claritin for 1-2 weeks. I have sent tessalon  pearles to take as needed for cough. Use albuterol  inhaler as needed for wheezing and shortness of breath if this were to occur. Drink lots of water, alternate Pedialyte or electrolyte drink.   Return with fever that dose not improve with tylenol , shortness of breath, chest pain or new and worsening symptoms. Follow up with PCP in 2-3 days.

## 2023-12-15 NOTE — ED Provider Notes (Signed)
 [facility]  EMERGENCY DEPARTMENT AT [facility] Provider Note   CSN: [id number] Arrival date & time: 12/15/23  1225     History  Chief Complaint  Patient presents with   Cough    Tammy [name] is a 83 y.o. female.  With history of osteoporosis, stage III kidney disease hyperlipidemia, obesity presented to emergency room with 3 days of cough.  She has noted some productive cough is like she is bringing up yellow phlegm.  She also has some associated ear pain.  Has not tried anything for symptoms.  Denies fever, shortness of breath, chest pain.  No history of COPD or asthma.   Cough      Home Medications Prior to Admission medications   Medication Sig Start Date End Date Taking? Authorizing Provider  amLODipine -valsartan  (EXFORGE ) 5-160 MG tablet TAKE 1 TABLET BY MOUTH EVERY DAY 10/28/22   Tammy [name], Tammy Lemmings, MD  aspirin  EC 81 MG tablet Take 1 tablet (81 mg total) by mouth daily. Swallow whole. 03/13/22   Tammy [name], Tammy Coppersmith, MD  atorvastatin  (LIPITOR) 40 MG tablet TAKE 1 TABLET BY MOUTH EVERYDAY AT BEDTIME 08/31/23   Tammy [name], Tammy Coppersmith, MD  bisacodyl  (DULCOLAX) 5 MG EC tablet Take 5 mg by mouth 2 (two) times daily as needed.    [provider]  cyanocobalamin  (VITAMIN B12) 1000 MCG/ML injection inject 1 ml once a month 06/23/23   Tammy [name], Tammy Coppersmith, MD  cyclobenzaprine  (FLEXERIL ) 10 MG tablet Take 10 mg by mouth 3 (three) times daily as needed. 11/06/21   [provider]  donepezil  (ARICEPT ) 5 MG tablet Take 1 tablet (5 mg total) by mouth at bedtime. 11/12/23   Wertman, Tammy E, PA-C  gabapentin (NEURONTIN) 100 MG capsule at bedtime.    [provider]  LINZESS 72 MCG capsule As needed 12/16/22   [provider]  melatonin 3 MG TABS tablet Take 3 mg by mouth at bedtime.    [provider]  Multiple Vitamins-Minerals (MULTI-VITAMIN GUMMIES PO) Take by mouth.    [provider]  Multiple  Vitamins-Minerals (VITRUM 50+ SENIOR MULTI PO) Take by mouth.    [provider]  oxyCODONE  (OXY IR/ROXICODONE ) 5 MG immediate release tablet Take 1-2 tablets (5-10 mg total) by mouth every 3 (three) hours as needed for breakthrough pain. Patient taking differently: Take 10 mg by mouth in the morning, at noon, in the evening, and at bedtime. 01/31/14   Porterfield, Amber, PA-C  oxymetazoline  (AFRIN) 0.05 % nasal spray Place 1 spray into both nostrils 2 (two) times daily as needed.    [provider]  pantoprazole  (PROTONIX ) 40 MG tablet TAKE 1 TABLET BY MOUTH TWICE A DAY 09/21/23   Tammy [name], Tammy Coppersmith, MD  SYRINGE-NEEDLE, DISP, 3 ML (BD SAFETYGLIDE SYRINGE/NEEDLE) 25G X 1" 3 ML MISC Use to inject b12 01/06/23   Tammy [name], Tammy Coppersmith, MD      Allergies    Hydrocodone and Penicillins    Review of Systems   Review of Systems  Respiratory:  Positive for cough.     Physical Exam Updated Vital Signs BP (!) 172/74   Pulse 85   Temp 98.2 F (36.8 C) (Oral)   Resp 20   SpO2 100%  Physical Exam Vitals and nursing note reviewed.  Constitutional:      General: She is not in acute distress.    Appearance: She is not toxic-appearing.  HENT:     Head: Normocephalic and atraumatic.  Eyes:  General: No scleral icterus.    Conjunctiva/sclera: Conjunctivae normal.  Cardiovascular:     Rate and Rhythm: Normal rate and regular rhythm.     Pulses: Normal pulses.     Heart sounds: Normal heart sounds.  Pulmonary:     Effort: Pulmonary effort is normal. No respiratory distress.     Breath sounds: Normal breath sounds.  Abdominal:     General: Abdomen is flat. Bowel sounds are normal.     Palpations: Abdomen is soft.     Tenderness: There is no abdominal tenderness.  Musculoskeletal:     Right lower leg: No edema.     Left lower leg: No edema.  Skin:    General: Skin is warm and dry.     Findings: No lesion.  Neurological:     General: No focal deficit present.      Mental Status: She is alert and oriented to person, place, and time. Mental status is at baseline.     ED Results / Procedures / Treatments   Labs (all labs ordered are listed, but only abnormal results are displayed) Labs Reviewed  RESP PANEL BY RT-PCR (RSV, FLU A&B, COVID)  [id number]    EKG None  Radiology DG Chest 2 View Result Date: 12/15/2023 CLINICAL DATA:  Cough EXAM: CHEST - 2 VIEW COMPARISON:  CT chest May 28, 2022 FINDINGS: The heart size and mediastinal contours are within normal limits. Both lungs are clear. The visualized skeletal structures are unremarkable. IMPRESSION: No active cardiopulmonary disease. COPD changes hyperinflation both lung bases Electronically Signed   By: Tammy [name] M.D.   On: 12/15/2023 14:29    Procedures Procedures    Medications Ordered in ED Medications  acetaminophen  (TYLENOL ) tablet 650 mg (650 mg Oral Given 12/15/23 1311)    ED Course/ Medical Decision Making/ A&P                                 Medical Decision Making Amount and/or Complexity of Data Reviewed Radiology: ordered.  Risk OTC drugs. Prescription drug management.   [name] 83 y.o. presented today for URI like symptoms. Working DDx that I considered at this time includes, but not limited to, viral illness, pharyngitis, mono, sinusitis, electrolyte abnormality, AOM.  R/o DDx: these additional diagnoses are not consistent with patient's history, presentation, physical exam, labs/imaging findings.  Review of prior external notes: 11/12/23 OV  Labs:  Respiratory Panel: negative   Imaging:  Negative   Problem List / ED Course / Critical interventions / Medication management  Patient reporting with cough associated with runny nose, low grade fever and congestion. Has been going on for 3 days. She has not noted fever at home, shortness of breath or chest pain. On arrival lungs clear bilaterally. No hypoxia and vitals stable. She is well appearing.  No generalized weakness, tolerating eating and drinking. She has not tried anything for symptoms. She dose not have COPD or asthma diagnosis, no smoking history. No swelling in feet and ankles. Symptoms are most consistent with URI. Negative chest x-ray. Feel that symptomatic management is most appropriate at this time. Will have follow up with PCP in 2-3 days for recheck and given return precautions.   I ordered medication including Tylenol  for low-grade fever Reevaluation of the patient after these medicines showed that the patient improved Patients vitals assessed. Upon arrival patient is hemodynamically stable.  I have reviewed the patients home medicines and  have made adjustments as needed     Plan:  F/u w/ PCP in 2-3d to ensure resolution of sx.  Patient was given return precautions. Patient stable for discharge at this time.  Patient educated on sx and dx and verbalized understanding of plan. Return to ER if new or worsening sx.          Final Clinical Impression(s) / ED Diagnoses Final diagnoses:  Acute cough    Rx / DC Orders ED Discharge Orders     None         Eudora Heron, PA-C 12/15/23 1730    Afton Horse T, DO 12/19/23 1552

## 2023-12-21 ENCOUNTER — Other Ambulatory Visit: Payer: Self-pay | Admitting: Internal Medicine

## 2023-12-22 ENCOUNTER — Telehealth: Payer: Self-pay

## 2023-12-22 NOTE — Telephone Encounter (Signed)
 Patient is aware

## 2023-12-22 NOTE — Telephone Encounter (Signed)
 Copied from CRM [id number]. Topic: Clinical - Medication Question >> Dec 22, 2023 11:09 AM Tammy [name] wrote: Reason for CRM: Patient would like to know if Dr. Ival Marines could send in a prescription of oxymetazoline  (AFRIN) 0.05 % nasal spray before she leaves to go out of town. Was diagnosed with Sinus infection caused cough by ER visit did try to schedule follow up for May 7th, but patient stated she will be out of town. Please call [phone] to update whether it may be sent it or not.

## 2024-01-14 DIAGNOSIS — N189 Chronic kidney disease, unspecified: Secondary | ICD-10-CM | POA: Diagnosis not present

## 2024-01-14 DIAGNOSIS — H25811 Combined forms of age-related cataract, right eye: Secondary | ICD-10-CM | POA: Diagnosis not present

## 2024-01-14 DIAGNOSIS — I130 Hypertensive heart and chronic kidney disease with heart failure and stage 1 through stage 4 chronic kidney disease, or unspecified chronic kidney disease: Secondary | ICD-10-CM | POA: Diagnosis not present

## 2024-01-14 DIAGNOSIS — I509 Heart failure, unspecified: Secondary | ICD-10-CM | POA: Diagnosis not present

## 2024-01-15 ENCOUNTER — Other Ambulatory Visit: Payer: Self-pay | Admitting: Internal Medicine

## 2024-01-27 ENCOUNTER — Other Ambulatory Visit: Payer: Self-pay | Admitting: Internal Medicine

## 2024-01-27 DIAGNOSIS — K219 Gastro-esophageal reflux disease without esophagitis: Secondary | ICD-10-CM

## 2024-01-27 MED ORDER — PANTOPRAZOLE SODIUM 40 MG PO TBEC: 40.0000 mg | DELAYED_RELEASE_TABLET | Freq: Two times a day (BID) | ORAL | 1 refills | Status: AC

## 2024-01-27 NOTE — Telephone Encounter (Signed)
 Copied from CRM [id number]. Topic: Clinical - Medication Refill >> Jan 27, 2024  2:56 PM Marlan Silva wrote: Medication:  pantoprazole  (PROTONIX ) 40 MG tablet   Has the patient contacted their pharmacy? Yes (Agent: If no, request that the patient contact the pharmacy for the refill. If patient does not wish to contact the pharmacy document the reason why and proceed with request.) (Agent: If yes, when and what did the pharmacy advise?)  This is the patient's preferred pharmacy:  [id number] Jonette Nestle, [place] - [id number] [place] [address] Phone: [phone] Fax: [phone]  Is this the correct pharmacy for this prescription? Yes If no, delete pharmacy and type the correct one.   Has the prescription been filled recently? Yes  Is the patient out of the medication? Yes  Has the patient been seen for an appointment in the last year OR does the patient have an upcoming appointment? Yes  Can we respond through MyChart? Yes  Agent: Please be advised that Rx refills may take up to 3 business days. We ask that you follow-up with your pharmacy.

## 2024-02-05 ENCOUNTER — Other Ambulatory Visit: Payer: Self-pay | Admitting: Internal Medicine

## 2024-02-17 DIAGNOSIS — G894 Chronic pain syndrome: Secondary | ICD-10-CM | POA: Diagnosis not present

## 2024-02-17 DIAGNOSIS — T402X5D Adverse effect of other opioids, subsequent encounter: Secondary | ICD-10-CM | POA: Diagnosis not present

## 2024-02-17 DIAGNOSIS — M5416 Radiculopathy, lumbar region: Secondary | ICD-10-CM | POA: Diagnosis not present

## 2024-02-17 DIAGNOSIS — Z79899 Other long term (current) drug therapy: Secondary | ICD-10-CM | POA: Diagnosis not present

## 2024-02-24 ENCOUNTER — Other Ambulatory Visit: Payer: Self-pay | Admitting: Internal Medicine

## 2024-02-25 DIAGNOSIS — Z5181 Encounter for therapeutic drug level monitoring: Secondary | ICD-10-CM | POA: Diagnosis not present

## 2024-02-25 DIAGNOSIS — G894 Chronic pain syndrome: Secondary | ICD-10-CM | POA: Diagnosis not present

## 2024-04-19 ENCOUNTER — Other Ambulatory Visit: Payer: Self-pay | Admitting: Internal Medicine

## 2024-05-09 ENCOUNTER — Encounter: Payer: Self-pay | Admitting: Internal Medicine

## 2024-05-09 ENCOUNTER — Ambulatory Visit: Admitting: Internal Medicine

## 2024-05-09 VITALS — BP 128/78 | HR 70 | Temp 97.8°F | Wt 180.6 lb

## 2024-05-09 DIAGNOSIS — I10 Essential (primary) hypertension: Secondary | ICD-10-CM | POA: Diagnosis not present

## 2024-05-09 DIAGNOSIS — E785 Hyperlipidemia, unspecified: Secondary | ICD-10-CM | POA: Diagnosis not present

## 2024-05-09 DIAGNOSIS — N1831 Chronic kidney disease, stage 3a: Secondary | ICD-10-CM | POA: Diagnosis not present

## 2024-05-09 DIAGNOSIS — G894 Chronic pain syndrome: Secondary | ICD-10-CM | POA: Diagnosis not present

## 2024-05-09 DIAGNOSIS — G4700 Insomnia, unspecified: Secondary | ICD-10-CM | POA: Diagnosis not present

## 2024-05-09 DIAGNOSIS — R7303 Prediabetes: Secondary | ICD-10-CM | POA: Diagnosis not present

## 2024-05-09 DIAGNOSIS — E538 Deficiency of other specified B group vitamins: Secondary | ICD-10-CM | POA: Diagnosis not present

## 2024-05-09 DIAGNOSIS — M5416 Radiculopathy, lumbar region: Secondary | ICD-10-CM | POA: Diagnosis not present

## 2024-05-09 DIAGNOSIS — M810 Age-related osteoporosis without current pathological fracture: Secondary | ICD-10-CM

## 2024-05-09 DIAGNOSIS — E669 Obesity, unspecified: Secondary | ICD-10-CM

## 2024-05-09 LAB — COMPREHENSIVE METABOLIC PANEL WITH GFR
ALT: 16 U/L (ref 0–35)
AST: 24 U/L (ref 0–37)
Albumin: 4.2 g/dL (ref 3.5–5.2)
Alkaline Phosphatase: 59 U/L (ref 39–117)
BUN: 15 mg/dL (ref 6–23)
CO2: 26 meq/L (ref 19–32)
Calcium: 9.8 mg/dL (ref 8.4–10.5)
Chloride: 104 meq/L (ref 96–112)
Creatinine, Ser: 1.06 mg/dL (ref 0.40–1.20)
GFR: 48.37 mL/min — ABNORMAL LOW (ref >60.00)
Glucose, Bld: 102 mg/dL — ABNORMAL HIGH (ref 70–99)
Potassium: 4.1 meq/L (ref 3.5–5.1)
Sodium: 139 meq/L (ref 135–145)
Total Bilirubin: 0.6 mg/dL (ref 0.2–1.2)
Total Protein: 7.3 g/dL (ref 6.0–8.3)

## 2024-05-09 LAB — LIPID PANEL
Cholesterol: 209 mg/dL — ABNORMAL HIGH (ref 0–200)
HDL: 45.8 mg/dL (ref >39.00)
LDL Cholesterol: 118 mg/dL — ABNORMAL HIGH (ref 0–99)
NonHDL: 163.51
Total CHOL/HDL Ratio: 5
Triglycerides: 227 mg/dL — ABNORMAL HIGH (ref 0.0–149.0)
VLDL: 45.4 mg/dL — ABNORMAL HIGH (ref 0.0–40.0)

## 2024-05-09 LAB — CBC WITH DIFFERENTIAL/PLATELET
Basophils Absolute: 0 K/uL (ref 0.0–0.1)
Basophils Relative: 0.9 % (ref 0.0–3.0)
Eosinophils Absolute: 0.2 K/uL (ref 0.0–0.7)
Eosinophils Relative: 4 % (ref 0.0–5.0)
HCT: 36.9 % (ref 36.0–46.0)
Hemoglobin: 12 g/dL (ref 12.0–15.0)
Lymphocytes Relative: 29.6 % (ref 12.0–46.0)
Lymphs Abs: 1.4 K/uL (ref 0.7–4.0)
MCHC: 32.6 g/dL (ref 30.0–36.0)
MCV: 82.5 fl (ref 78.0–100.0)
Monocytes Absolute: 0.3 K/uL (ref 0.1–1.0)
Monocytes Relative: 7.2 % (ref 3.0–12.0)
Neutro Abs: 2.7 K/uL (ref 1.4–7.7)
Neutrophils Relative %: 58.3 % (ref 43.0–77.0)
Platelets: 170 K/uL (ref 150.0–400.0)
RBC: 4.47 Mil/uL (ref 3.87–5.11)
RDW: 15.2 % (ref 11.5–15.5)
WBC: 4.7 K/uL (ref 4.0–10.5)

## 2024-05-09 LAB — POCT GLYCOSYLATED HEMOGLOBIN (HGB A1C): Hemoglobin A1C: 5.8 % — AB (ref 4.0–5.6)

## 2024-05-09 LAB — VITAMIN D 25 HYDROXY (VIT D DEFICIENCY, FRACTURES): VITD: 37.43 ng/mL (ref 30.00–100.00)

## 2024-05-09 LAB — VITAMIN B12: Vitamin B-12: 420 pg/mL (ref 211–911)

## 2024-05-09 MED ORDER — AMLODIPINE BESYLATE-VALSARTAN 5-160 MG PO TABS: 1.0000 | ORAL_TABLET | Freq: Every day | ORAL | 0 refills | Status: DC

## 2024-05-09 MED ORDER — CYANOCOBALAMIN 1000 MCG/ML IJ SOLN: INTRAMUSCULAR | 11 refills | Status: AC

## 2024-05-09 MED ORDER — ATORVASTATIN CALCIUM 40 MG PO TABS: 40.0000 mg | ORAL_TABLET | Freq: Every day | ORAL | 1 refills | Status: DC

## 2024-05-09 NOTE — Assessment & Plan Note (Signed)
 Sleeps about 5 hours every night.

## 2024-05-09 NOTE — Assessment & Plan Note (Signed)
 A1c remains stable in the prediabetic range at 5.8.

## 2024-05-09 NOTE — Assessment & Plan Note (Signed)
 Check levels today.

## 2024-05-09 NOTE — Assessment & Plan Note (Signed)
 Initially elevated, came down towards the end of the visit, continue Exforge .

## 2024-05-09 NOTE — Progress Notes (Signed)
 Established Patient Office Visit     CC/Reason for Visit: Follow-up chronic conditions  HPI: Tammy [name] is a 83 y.o. female who is coming in today for the above mentioned reasons. Past Medical History is significant for: Hypertension, hyperlipidemia, chronic kidney disease stage III, impaired glucose tolerance, iron  deficiency anemia, GERD, vitamin B12 deficiency.  She is overall doing okay.  She is needing refills of Exforge , already had her flu vaccine.  She is not taking Aricept .   Past Medical/Surgical History: Past Medical History:  Diagnosis Date   Anxiety    Arthritis    knees and back   Bruised rib 01/18/2014   x-rayed   Chicken pox    DDD (degenerative disc disease), lumbar    Depression    mild, never on medication   Deviated septum    GERD (gastroesophageal reflux disease)    Heart murmur    told in 2012 benign   Hemorrhoids    High cholesterol    borderline, no meds   Hyperglycemia    borderline   Hypertension    high blood pressure readings, no meds   Osteoarthritis    Pre-diabetes    diet- controlled   Rib pain on left side 01/20/2014    xray done seen by MD 01/20/14 in [facility]    Scoliosis    Varicose vein     Past Surgical History:  Procedure Laterality Date   ABDOMINAL HYSTERECTOMY  1972   COLONOSCOPY  05/27/2013   DENTAL SURGERY  2007   left jaw bone graft, 2 implants on left, 2 implants on right   HEMORRHOID BANDING  06/03/13   HEMORROIDECTOMY  1975   INCISION AND DRAINAGE BREAST ABSCESS Bilateral 1966, 1967   MASS EXCISION N/A 04/22/2013   Procedure: EXCISION MULTPILE soft tissue masses and abnormal skin lesion left thigh /left chest/ left posterior shoulder/ right flank ;  Surgeon: Vicenta DELENA Poli, MD;  Location: [facility];  Service: General;  Laterality: N/A;  multiple sites:  left thigh, left shoulder, right flank, right upper arm   OVARIAN CYST SURGERY Bilateral 1991   OVARIAN CYST SURGERY     SEPTOPLASTY Bilateral 12/16/2016    Procedure: SEPTOPLASTY;  Surgeon: Daniel Moccasin, MD;  Location: [facility];  Service: ENT;  Laterality: Bilateral;   TONSILLECTOMY AND ADENOIDECTOMY  1973   TOTAL KNEE ARTHROPLASTY Right 07/25/2013   Procedure: RIGHT TOTAL KNEE ARTHROPLASTY;  Surgeon: Dempsey LULLA Moan, MD;  Location: [facility];  Service: Orthopedics;  Laterality: Right;  Left knee cortisone injection   TOTAL KNEE ARTHROPLASTY Left 01/30/2014   Procedure: LEFT TOTAL KNEE ARTHROPLASTY;  Surgeon: Dempsey Moan LULLA, MD;  Location: [facility];  Service: Orthopedics;  Laterality: Left;   TUBAL LIGATION  1970    Social History:  reports that she has never smoked. She has never used smokeless tobacco. She reports current alcohol use. She reports that she does not use drugs.  Allergies: Allergies  Allergen Reactions   Hydrocodone     Made her feel very strange, wired, restless  Made her feel very strange, wired, restless   Penicillins Rash    Family History:  Family History  Problem Relation Age of Onset   Alcohol abuse Father    Heart disease Father    Arthritis Mother    Stroke Mother    Hypertension Mother    Diabetes Mother    Breast cancer Maternal Grandmother    Breast cancer Maternal Aunt    Colon  cancer Maternal Uncle    Esophageal cancer Neg Hx    Stomach cancer Neg Hx    Rectal cancer Neg Hx      Current Outpatient Medications:    aspirin  EC 81 MG tablet, Take 1 tablet (81 mg total) by mouth daily. Swallow whole., Disp: 30 tablet, Rfl: 12   gabapentin (NEURONTIN) 100 MG capsule, at bedtime., Disp: , Rfl:    loratadine (CLARITIN) 10 MG tablet, Take 10 mg by mouth daily., Disp: , Rfl:    magnesium gluconate (MAGONATE) 500 (27 Mg) MG TABS tablet, Take 500 mg by mouth in the morning and at bedtime., Disp: , Rfl:    Multiple Vitamins-Minerals (VITRUM 50+ SENIOR MULTI PO), Take by mouth., Disp: , Rfl:    Omega 3-6-9 Fatty Acids (OMEGA 3-6-9 COMPLEX) CAPS, Take by mouth. With fish flax and borage, Disp: , Rfl:     oxyCODONE  (OXY IR/ROXICODONE ) 5 MG immediate release tablet, Take 1-2 tablets (5-10 mg total) by mouth every 3 (three) hours as needed for breakthrough pain., Disp: 80 tablet, Rfl: 0   oxymetazoline  (AFRIN) 0.05 % nasal spray, Place 1 spray into both nostrils 2 (two) times daily as needed., Disp: , Rfl:    pantoprazole  (PROTONIX ) 40 MG tablet, Take 1 tablet (40 mg total) by mouth 2 (two) times daily., Disp: 180 tablet, Rfl: 1   Specialty Vitamins Products (MEMORY COMPLEX BRAIN HEALTH PO), Take by mouth., Disp: , Rfl:    SYRINGE-NEEDLE, DISP, 3 ML (BD SAFETYGLIDE SYRINGE/NEEDLE) 25G X 1 3 ML MISC, Use to inject b12, Disp: 100 each, Rfl: 11   amLODipine -valsartan  (EXFORGE ) 5-160 MG tablet, Take 1 tablet by mouth daily., Disp: 90 tablet, Rfl: 0   atorvastatin  (LIPITOR) 40 MG tablet, Take 1 tablet (40 mg total) by mouth daily., Disp: 90 tablet, Rfl: 1   bisacodyl  (DULCOLAX) 5 MG EC tablet, Take 5 mg by mouth 2 (two) times daily as needed., Disp: , Rfl:    cyanocobalamin  (VITAMIN B12) 1000 MCG/ML injection, inject 1 ml once a month, Disp: 6 mL, Rfl: 11   cyclobenzaprine  (FLEXERIL ) 10 MG tablet, Take 10 mg by mouth 3 (three) times daily as needed., Disp: , Rfl:    donepezil  (ARICEPT ) 5 MG tablet, Take 1 tablet (5 mg total) by mouth at bedtime., Disp: 90 tablet, Rfl: 3   LINZESS 72 MCG capsule, As needed, Disp: , Rfl:    melatonin 3 MG TABS tablet, Take 3 mg by mouth at bedtime., Disp: , Rfl:    Multiple Vitamins-Minerals (MULTI-VITAMIN GUMMIES PO), Take by mouth., Disp: , Rfl:   Review of Systems:  Negative unless indicated in HPI.   Physical Exam: Vitals:   05/09/24 0736 05/09/24 0740 05/09/24 0802  BP: (!) 150/78 (!) 178/88 128/78  Pulse: 70    Temp: 97.8 F (36.6 C)    TempSrc: Oral    SpO2: 97%    Weight: 180 lb 9.6 oz (81.9 kg)      Body mass index is 35.27 kg/m.   Physical Exam Vitals reviewed.  Constitutional:      Appearance: Normal appearance. She is obese.  HENT:      Head: Normocephalic and atraumatic.  Eyes:     Conjunctiva/sclera: Conjunctivae normal.  Cardiovascular:     Rate and Rhythm: Normal rate and regular rhythm.  Pulmonary:     Effort: Pulmonary effort is normal.     Breath sounds: Normal breath sounds.  Skin:    General: Skin is warm and dry.  Neurological:  General: No focal deficit present.     Mental Status: She is alert and oriented to person, place, and time.  Psychiatric:        Mood and Affect: Mood normal.        Behavior: Behavior normal.        Thought Content: Thought content normal.        Judgment: Judgment normal.      Impression and Plan:  Prediabetes Assessment & Plan: A1c remains stable in the prediabetic range at 5.8.  Orders: -     POCT glycosylated hemoglobin (Hb A1C)  Vitamin B12 deficiency Assessment & Plan: Check levels today.  Orders: -     Cyanocobalamin ; inject 1 ml once a month  Dispense: 6 mL; Refill: 11 -     Vitamin B12; Future  Essential hypertension, benign Assessment & Plan: Initially elevated, came down towards the end of the visit, continue Exforge .  Orders: -     amLODIPine  Besylate-Valsartan ; Take 1 tablet by mouth daily.  Dispense: 90 tablet; Refill: 0 -     CBC with Differential/Platelet; Future -     Comprehensive metabolic panel with GFR; Future  Stage 3a chronic kidney disease (HCC) Assessment & Plan: Check renal function today.  Baseline creatinine between 1.3 and 1.5.   Insomnia, unspecified type Assessment & Plan: Sleeps about 5 hours every night.   Hyperlipidemia, mild Assessment & Plan: Check lipids today, continue atorvastatin  40 mg.  Orders: -     Atorvastatin  Calcium ; Take 1 tablet (40 mg total) by mouth daily.  Dispense: 90 tablet; Refill: 1 -     Lipid panel; Future  Obesity (BMI 30-39.9) Assessment & Plan: -Discussed healthy lifestyle, including increased physical activity and better food choices to promote weight loss.    Age-related  osteoporosis without current pathological fracture -     VITAMIN D  25 Hydroxy (Vit-D Deficiency, Fractures); Future     Time spent:30 minutes reviewing chart, interviewing and examining patient and formulating plan of care.     Tully Theophilus Andrews, MD [facility]

## 2024-05-09 NOTE — Assessment & Plan Note (Signed)
 Check lipids today, continue atorvastatin  40 mg.

## 2024-05-09 NOTE — Assessment & Plan Note (Signed)
 Check renal function today.  Baseline creatinine between 1.3 and 1.5.

## 2024-05-09 NOTE — Assessment & Plan Note (Signed)
 Discussed healthy lifestyle, including increased physical activity and better food choices to promote weight loss.

## 2024-05-11 ENCOUNTER — Ambulatory Visit: Payer: Self-pay | Admitting: Internal Medicine

## 2024-05-11 ENCOUNTER — Telehealth: Payer: Self-pay | Admitting: *Deleted

## 2024-05-11 ENCOUNTER — Ambulatory Visit: Admitting: Internal Medicine

## 2024-05-11 DIAGNOSIS — E785 Hyperlipidemia, unspecified: Secondary | ICD-10-CM

## 2024-05-11 NOTE — Telephone Encounter (Signed)
 Copied from CRM [id number]. Topic: Clinical - Lab/Test Results >> May 11, 2024  4:58 PM Chiquita SQUIBB wrote: Reason for CRM: Patient is returning the call regarding lab work, relayed message to the patient. Patient states she is taking the atorvastatin  (LIPITOR) 40 MG tablet.

## 2024-05-12 MED ORDER — ATORVASTATIN CALCIUM 80 MG PO TABS: 80.0000 mg | ORAL_TABLET | Freq: Every day | ORAL | 3 refills | Status: AC

## 2024-05-12 NOTE — Telephone Encounter (Signed)
Noted on lab result note

## 2024-06-06 ENCOUNTER — Other Ambulatory Visit: Payer: Self-pay | Admitting: Internal Medicine

## 2024-06-06 DIAGNOSIS — I10 Essential (primary) hypertension: Secondary | ICD-10-CM

## 2024-06-09 DIAGNOSIS — H16223 Keratoconjunctivitis sicca, not specified as Sjogren's, bilateral: Secondary | ICD-10-CM | POA: Diagnosis not present

## 2024-06-14 DIAGNOSIS — M5416 Radiculopathy, lumbar region: Secondary | ICD-10-CM | POA: Diagnosis not present

## 2024-06-14 NOTE — Progress Notes (Addendum)
 Tammy [name]                                          MRN: [id number]   06/14/2024   The [facility] Quality Team Specialist reviewed this patient medical record for the purposes of chart review for care gap closure. The following were reviewed: chart review for care gap closure-kidney health evaluation for diabetes:eGFR  and uACR.   08/22/2024- no uacr to close KED gap.  [facility] Quality Team

## 2024-09-07 ENCOUNTER — Ambulatory Visit: Admitting: Internal Medicine

## 2024-09-12 ENCOUNTER — Encounter: Payer: Self-pay | Admitting: Internal Medicine

## 2024-09-12 ENCOUNTER — Ambulatory Visit: Admitting: Internal Medicine

## 2024-09-12 VITALS — BP 128/78 | HR 58 | Temp 97.5°F | Wt 182.9 lb

## 2024-09-12 DIAGNOSIS — R7303 Prediabetes: Secondary | ICD-10-CM

## 2024-09-12 DIAGNOSIS — Z6835 Body mass index (BMI) 35.0-35.9, adult: Secondary | ICD-10-CM | POA: Diagnosis not present

## 2024-09-12 DIAGNOSIS — N1831 Chronic kidney disease, stage 3a: Secondary | ICD-10-CM | POA: Insufficient documentation

## 2024-09-12 DIAGNOSIS — E785 Hyperlipidemia, unspecified: Secondary | ICD-10-CM

## 2024-09-12 LAB — LIPID PANEL
Cholesterol: 157 mg/dL (ref 28–200)
HDL: 48 mg/dL
LDL Cholesterol: 78 mg/dL (ref 10–99)
NonHDL: 108.98
Total CHOL/HDL Ratio: 3
Triglycerides: 155 mg/dL — ABNORMAL HIGH (ref 10.0–149.0)
VLDL: 31 mg/dL (ref 0.0–40.0)

## 2024-09-12 LAB — POCT GLYCOSYLATED HEMOGLOBIN (HGB A1C): Hemoglobin A1C: 5.7 % — AB (ref 4.0–5.6)

## 2024-09-12 NOTE — Assessment & Plan Note (Signed)
 A1c remains stable in the prediabetic range at 5.7.  We have discussed continued lifestyle changes.

## 2024-09-12 NOTE — Progress Notes (Signed)
 "    Established Patient Office Visit     CC/Reason for Visit: Follow-up chronic medical conditions  HPI: Tammy [name] is a 84 y.o. female who is coming in today for the above mentioned reasons. Past Medical History is significant for: Hypertension, hyperlipidemia, impaired glucose tolerance, chronic kidney disease stage IIIa, iron  deficiency anemia, GERD, vitamin B12 deficiency.  No acute concerns or complaints.  She did increase her atorvastatin  from 40 to 80 mg at last visit.   Past Medical/Surgical History: Past Medical History:  Diagnosis Date   Anxiety    Arthritis    knees and back   Bruised rib 01/18/2014   x-rayed   Chicken pox    DDD (degenerative disc disease), lumbar    Depression    mild, never on medication   Deviated septum    GERD (gastroesophageal reflux disease)    Heart murmur    told in 2012 benign   Hemorrhoids    High cholesterol    borderline, no meds   Hyperglycemia    borderline   Hypertension    high blood pressure readings, no meds   Osteoarthritis    Pre-diabetes    diet- controlled   Rib pain on left side 01/20/2014    xray done seen by MD 01/20/14 in [facility]    Scoliosis    Varicose vein     Past Surgical History:  Procedure Laterality Date   ABDOMINAL HYSTERECTOMY  1972   COLONOSCOPY  05/27/2013   DENTAL SURGERY  2007   left jaw bone graft, 2 implants on left, 2 implants on right   HEMORRHOID BANDING  06/03/13   HEMORROIDECTOMY  1975   INCISION AND DRAINAGE BREAST ABSCESS Bilateral 1966, 1967   MASS EXCISION N/A 04/22/2013   Procedure: EXCISION MULTPILE soft tissue masses and abnormal skin lesion left thigh /left chest/ left posterior shoulder/ right flank ;  Surgeon: Vicenta DELENA Poli, MD;  Location: [facility];  Service: General;  Laterality: N/A;  multiple sites:  left thigh, left shoulder, right flank, right upper arm   OVARIAN CYST SURGERY Bilateral 1991   OVARIAN CYST SURGERY     SEPTOPLASTY Bilateral 12/16/2016   Procedure:  SEPTOPLASTY;  Surgeon: Daniel Moccasin, MD;  Location: [facility];  Service: ENT;  Laterality: Bilateral;   TONSILLECTOMY AND ADENOIDECTOMY  1973   TOTAL KNEE ARTHROPLASTY Right 07/25/2013   Procedure: RIGHT TOTAL KNEE ARTHROPLASTY;  Surgeon: Dempsey LULLA Moan, MD;  Location: [facility];  Service: Orthopedics;  Laterality: Right;  Left knee cortisone injection   TOTAL KNEE ARTHROPLASTY Left 01/30/2014   Procedure: LEFT TOTAL KNEE ARTHROPLASTY;  Surgeon: Dempsey Moan LULLA, MD;  Location: [facility];  Service: Orthopedics;  Laterality: Left;   TUBAL LIGATION  1970    Social History:  reports that she has never smoked. She has never used smokeless tobacco. She reports current alcohol use. She reports that she does not use drugs.  Allergies: Allergies[1]  Family History:  Family History  Problem Relation Age of Onset   Alcohol abuse Father    Heart disease Father    Arthritis Mother    Stroke Mother    Hypertension Mother    Diabetes Mother    Breast cancer Maternal Grandmother    Breast cancer Maternal Aunt    Colon cancer Maternal Uncle    Esophageal cancer Neg Hx    Stomach cancer Neg Hx    Rectal cancer Neg Hx     Current Medications[2]  Review of  Systems:  Negative unless indicated in HPI.   Physical Exam: Vitals:   09/12/24 0727 09/12/24 0732  BP: 130/80 128/78  Pulse: (!) 58   Temp: (!) 97.5 F (36.4 C)   TempSrc: Oral   SpO2: 94%   Weight: 182 lb 14.4 oz (83 kg)     Body mass index is 35.72 kg/m.   Physical Exam Vitals reviewed.  Constitutional:      Appearance: Normal appearance. She is obese.  HENT:     Head: Normocephalic and atraumatic.  Eyes:     Conjunctiva/sclera: Conjunctivae normal.  Cardiovascular:     Rate and Rhythm: Normal rate and regular rhythm.  Pulmonary:     Effort: Pulmonary effort is normal.     Breath sounds: Normal breath sounds.  Skin:    General: Skin is warm and dry.  Neurological:     General: No focal deficit present.      Mental Status: She is alert and oriented to person, place, and time.  Psychiatric:        Mood and Affect: Mood normal.        Behavior: Behavior normal.        Thought Content: Thought content normal.        Judgment: Judgment normal.      Impression and Plan:  Prediabetes Assessment & Plan: A1c remains stable in the prediabetic range at 5.7.  We have discussed continued lifestyle changes.  Orders: -     POCT glycosylated hemoglobin (Hb A1C)  Obesity, morbid (HCC) Assessment & Plan: -Discussed healthy lifestyle, including increased physical activity and better food choices to promote weight loss.    Stage 3a chronic kidney disease (HCC) Assessment & Plan: With a baseline creatinine of 1.060 and a GFR of 48, she is followed by nephrology.   Hyperlipidemia, mild Assessment & Plan: Check lipids today, continue atorvastatin  80 mg.  Orders: -     Lipid panel; Future     Time spent:32 minutes reviewing chart, interviewing and examining patient and formulating plan of care.     Tully Theophilus Andrews, MD [facility]     [1]  Allergies Allergen Reactions   Hydrocodone     Made her feel very strange, wired, restless  Made her feel very strange, wired, restless   Penicillins Rash  [2]  Current Outpatient Medications:    amLODipine -valsartan  (EXFORGE ) 5-160 MG tablet, TAKE 1 TABLET BY MOUTH EVERY DAY, Disp: 90 tablet, Rfl: 0   aspirin  EC 81 MG tablet, Take 1 tablet (81 mg total) by mouth daily. Swallow whole., Disp: 30 tablet, Rfl: 12   atorvastatin  (LIPITOR) 80 MG tablet, Take 1 tablet (80 mg total) by mouth daily., Disp: 90 tablet, Rfl: 3   bisacodyl  (DULCOLAX) 5 MG EC tablet, Take 5 mg by mouth 2 (two) times daily as needed., Disp: , Rfl:    cyanocobalamin  (VITAMIN B12) 1000 MCG/ML injection, inject 1 ml once a month, Disp: 6 mL, Rfl: 11   cyclobenzaprine  (FLEXERIL ) 10 MG tablet, Take 10 mg by mouth 3 (three) times daily as needed.,  Disp: , Rfl:    donepezil  (ARICEPT ) 5 MG tablet, Take 1 tablet (5 mg total) by mouth at bedtime., Disp: 90 tablet, Rfl: 3   gabapentin (NEURONTIN) 100 MG capsule, at bedtime., Disp: , Rfl:    LINZESS 72 MCG capsule, As needed, Disp: , Rfl:    loratadine (CLARITIN) 10 MG tablet, Take 10 mg by mouth daily., Disp: , Rfl:  magnesium gluconate (MAGONATE) 500 (27 Mg) MG TABS tablet, Take 500 mg by mouth in the morning and at bedtime., Disp: , Rfl:    melatonin 3 MG TABS tablet, Take 3 mg by mouth at bedtime., Disp: , Rfl:    Multiple Vitamins-Minerals (MULTI-VITAMIN GUMMIES PO), Take by mouth., Disp: , Rfl:    Multiple Vitamins-Minerals (VITRUM 50+ SENIOR MULTI PO), Take by mouth., Disp: , Rfl:    Omega 3-6-9 Fatty Acids (OMEGA 3-6-9 COMPLEX) CAPS, Take by mouth. With fish flax and borage, Disp: , Rfl:    oxyCODONE  (OXY IR/ROXICODONE ) 5 MG immediate release tablet, Take 1-2 tablets (5-10 mg total) by mouth every 3 (three) hours as needed for breakthrough pain., Disp: 80 tablet, Rfl: 0   oxymetazoline  (AFRIN) 0.05 % nasal spray, Place 1 spray into both nostrils 2 (two) times daily as needed., Disp: , Rfl:    pantoprazole  (PROTONIX ) 40 MG tablet, Take 1 tablet (40 mg total) by mouth 2 (two) times daily., Disp: 180 tablet, Rfl: 1   Specialty Vitamins Products (MEMORY COMPLEX BRAIN HEALTH PO), Take by mouth., Disp: , Rfl:    SYRINGE-NEEDLE, DISP, 3 ML (BD SAFETYGLIDE SYRINGE/NEEDLE) 25G X 1 3 ML MISC, Use to inject b12, Disp: 100 each, Rfl: 11  "

## 2024-09-12 NOTE — Assessment & Plan Note (Signed)
 With a baseline creatinine of 1.060 and a GFR of 48, she is followed by nephrology.

## 2024-09-12 NOTE — Assessment & Plan Note (Signed)
 Discussed healthy lifestyle, including increased physical activity and better food choices to promote weight loss.

## 2024-09-12 NOTE — Assessment & Plan Note (Signed)
 Check lipids today, continue atorvastatin  80 mg.

## 2024-09-23 ENCOUNTER — Ambulatory Visit

## 2024-09-23 VITALS — Ht 60.0 in | Wt 182.0 lb

## 2024-09-23 DIAGNOSIS — Z0000 Encounter for general adult medical examination without abnormal findings: Secondary | ICD-10-CM

## 2024-09-23 NOTE — Progress Notes (Signed)
 "  Chief Complaint  Patient presents with   Medicare Wellness     Subjective:   Tammy [name] is a 84 y.o. female who presents for a Medicare Annual Wellness Visit.  Visit info / Clinical Intake: Medicare Wellness Visit Type:: Subsequent Annual Wellness Visit Persons participating in visit and providing information:: patient Medicare Wellness Visit Mode:: Telephone If telephone:: video declined Since this visit was completed virtually, some vitals may be partially provided or unavailable. Missing vitals are due to the limitations of the virtual format.: Documented vitals are patient reported If Telephone or Video please confirm:: I connected with patient using audio/video enable telemedicine. I verified patient identity with two identifiers, discussed telehealth limitations, and patient agreed to proceed. Patient Location:: Home Provider Location:: Office Interpreter Needed?: No Pre-visit prep was completed: yes AWV questionnaire completed by patient prior to visit?: no Living arrangements:: (!) lives alone Patient's Overall Health Status Rating: very good Typical amount of pain: some Does pain affect daily life?: no Are you currently prescribed opioids?: (!) yes  Dietary Habits and Nutritional Risks How many meals a day?: (!) 1 Eats fruit and vegetables daily?: yes Most meals are obtained by: preparing own meals In the last 2 weeks, have you had any of the following?: none Diabetic:: no  Functional Status Activities of Daily Living (to include ambulation/medication): Independent Ambulation: Independent with device- listed below Home Assistive Devices/Equipment: Eyeglasses Medication Administration: Independent Home Management (perform basic housework or laundry): Independent Manage your own finances?: yes Primary transportation is: driving Concerns about vision?: no *vision screening is required for [facility]* Concerns about hearing?: no  Fall Screening Falls in the  past year?: 0 Number of falls in past year: 0 Was there an injury with Fall?: 0 Fall Risk Category Calculator: 0 Patient Fall Risk Level: Low Fall Risk  Fall Risk Patient at Risk for Falls Due to: No Fall Risks Fall risk Follow up: Falls evaluation completed  Home and Transportation Safety: All rugs have non-skid backing?: yes All stairs or steps have railings?: N/A, no stairs Grab bars in the bathtub or shower?: yes Have non-skid surface in bathtub or shower?: yes Good home lighting?: yes Regular seat belt use?: yes Hospital stays in the last year:: no  Cognitive Assessment Difficulty concentrating, remembering, or making decisions? : no Will 6CIT or Mini Cog be Completed: yes What year is it?: 0 points What month is it?: 0 points Give patient an address phrase to remember (5 components): [address] [place]  About what time is it?: 0 points Count backwards from 20 to 1: 0 points Say the months of the year in reverse: 0 points Repeat the address phrase from earlier: 0 points 6 CIT Score: 0 points  Advance Directives (For Healthcare) Does Patient Have a Medical Advance Directive?: Yes Does patient want to make changes to medical advance directive?: No - Patient declined Type of Advance Directive: Healthcare Power of [place]; Living will Copy of Healthcare Power of Attorney in Chart?: Yes - validated most recent copy scanned in chart (See row information) Copy of Living Will in Chart?: Yes - validated most recent copy scanned in chart (See row information) Would patient like information on creating a medical advance directive?: No - Patient declined  Reviewed/Updated  Reviewed/Updated: Reviewed All (Medical, Surgical, Family, Medications, Allergies, Care Teams, Patient Goals)    Allergies (verified) Hydrocodone and Penicillins   Current Medications (verified) Outpatient Encounter Medications as of 09/23/2024  Medication Sig   amLODipine -valsartan  (EXFORGE )  5-160 MG tablet  TAKE 1 TABLET BY MOUTH EVERY DAY   aspirin  EC 81 MG tablet Take 1 tablet (81 mg total) by mouth daily. Swallow whole.   atorvastatin  (LIPITOR) 80 MG tablet Take 1 tablet (80 mg total) by mouth daily.   bisacodyl  (DULCOLAX) 5 MG EC tablet Take 5 mg by mouth 2 (two) times daily as needed.   cyanocobalamin  (VITAMIN B12) 1000 MCG/ML injection inject 1 ml once a month   cyclobenzaprine  (FLEXERIL ) 10 MG tablet Take 10 mg by mouth 3 (three) times daily as needed.   donepezil  (ARICEPT ) 5 MG tablet Take 1 tablet (5 mg total) by mouth at bedtime.   gabapentin (NEURONTIN) 100 MG capsule at bedtime.   LINZESS 72 MCG capsule As needed   loratadine (CLARITIN) 10 MG tablet Take 10 mg by mouth daily.   magnesium gluconate (MAGONATE) 500 (27 Mg) MG TABS tablet Take 500 mg by mouth in the morning and at bedtime.   melatonin 3 MG TABS tablet Take 3 mg by mouth at bedtime.   Multiple Vitamins-Minerals (MULTI-VITAMIN GUMMIES PO) Take by mouth.   Multiple Vitamins-Minerals (VITRUM 50+ SENIOR MULTI PO) Take by mouth.   Omega 3-6-9 Fatty Acids (OMEGA 3-6-9 COMPLEX) CAPS Take by mouth. With fish flax and borage   oxyCODONE  (OXY IR/ROXICODONE ) 5 MG immediate release tablet Take 1-2 tablets (5-10 mg total) by mouth every 3 (three) hours as needed for breakthrough pain.   oxymetazoline  (AFRIN) 0.05 % nasal spray Place 1 spray into both nostrils 2 (two) times daily as needed.   pantoprazole  (PROTONIX ) 40 MG tablet Take 1 tablet (40 mg total) by mouth 2 (two) times daily.   Specialty Vitamins Products (MEMORY COMPLEX BRAIN HEALTH PO) Take by mouth.   SYRINGE-NEEDLE, DISP, 3 ML (BD SAFETYGLIDE SYRINGE/NEEDLE) 25G X 1 3 ML MISC Use to inject b12   No facility-administered encounter medications on file as of 09/23/2024.    History: Past Medical History:  Diagnosis Date   Anxiety    Arthritis    knees and back   Bruised rib 01/18/2014   x-rayed   Chicken pox    DDD (degenerative disc disease), lumbar     Depression    mild, never on medication   Deviated septum    GERD (gastroesophageal reflux disease)    Heart murmur    told in 2012 benign   Hemorrhoids    High cholesterol    borderline, no meds   Hyperglycemia    borderline   Hypertension    high blood pressure readings, no meds   Osteoarthritis    Pre-diabetes    diet- controlled   Rib pain on left side 01/20/2014    xray done seen by MD 01/20/14 in [facility]    Scoliosis    Varicose vein    Past Surgical History:  Procedure Laterality Date   ABDOMINAL HYSTERECTOMY  1972   COLONOSCOPY  05/27/2013   DENTAL SURGERY  2007   left jaw bone graft, 2 implants on left, 2 implants on right   HEMORRHOID BANDING  06/03/13   HEMORROIDECTOMY  1975   INCISION AND DRAINAGE BREAST ABSCESS Bilateral 1966, 1967   MASS EXCISION N/A 04/22/2013   Procedure: EXCISION MULTPILE soft tissue masses and abnormal skin lesion left thigh /left chest/ left posterior shoulder/ right flank ;  Surgeon: Vicenta DELENA Poli, MD;  Location: [facility];  Service: General;  Laterality: N/A;  multiple sites:  left thigh, left shoulder, right flank, right upper arm   OVARIAN CYST SURGERY Bilateral  1991   OVARIAN CYST SURGERY     SEPTOPLASTY Bilateral 12/16/2016   Procedure: SEPTOPLASTY;  Surgeon: Daniel Moccasin, MD;  Location: [facility];  Service: ENT;  Laterality: Bilateral;   TONSILLECTOMY AND ADENOIDECTOMY  1973   TOTAL KNEE ARTHROPLASTY Right 07/25/2013   Procedure: RIGHT TOTAL KNEE ARTHROPLASTY;  Surgeon: Dempsey LULLA Moan, MD;  Location: [facility];  Service: Orthopedics;  Laterality: Right;  Left knee cortisone injection   TOTAL KNEE ARTHROPLASTY Left 01/30/2014   Procedure: LEFT TOTAL KNEE ARTHROPLASTY;  Surgeon: Dempsey Moan LULLA, MD;  Location: [facility];  Service: Orthopedics;  Laterality: Left;   TUBAL LIGATION  1970   Family History  Problem Relation Age of Onset   Alcohol abuse Father    Heart disease Father    Arthritis Mother    Stroke Mother     Hypertension Mother    Diabetes Mother    Breast cancer Maternal Grandmother    Breast cancer Maternal Aunt    Colon cancer Maternal Uncle    Esophageal cancer Neg Hx    Stomach cancer Neg Hx    Rectal cancer Neg Hx    Social History   Occupational History   Not on file  Tobacco Use   Smoking status: Never   Smokeless tobacco: Never  Substance and Sexual Activity   Alcohol use: Yes    Comment: socially   Drug use: No   Sexual activity: Not on file   Tobacco Counseling Counseling given: No  SDOH Screenings   Food Insecurity: No Food Insecurity (09/23/2024)  Housing: Low Risk (09/23/2024)  Transportation Needs: No Transportation Needs (09/23/2024)  Utilities: Not At Risk (09/23/2024)  Alcohol Screen: Low Risk (09/15/2023)  Depression (PHQ2-9): Low Risk (09/23/2024)  Financial Resource Strain: Low Risk (09/15/2023)  Physical Activity: Insufficiently Active (09/23/2024)  Social Connections: Moderately Integrated (09/23/2024)  Stress: No Stress Concern Present (09/23/2024)  Tobacco Use: Low Risk (09/23/2024)  Health Literacy: Adequate Health Literacy (09/23/2024)   See flowsheets for full screening details  Depression Screen PHQ 2 & 9 Depression Scale- Over the past 2 weeks, how often have you been bothered by any of the following problems? Little interest or pleasure in doing things: 0 Feeling down, depressed, or hopeless (PHQ Adolescent also includes...irritable): 0 PHQ-2 Total Score: 0 Trouble falling or staying asleep, or sleeping too much: 2 Feeling tired or having little energy: 0 Poor appetite or overeating (PHQ Adolescent also includes...weight loss): 0 Feeling bad about yourself - or that you are a failure or have let yourself or your family down: 1 Trouble concentrating on things, such as reading the newspaper or watching television (PHQ Adolescent also includes...like school work): 0 Moving or speaking so slowly that other people could have noticed. Or the opposite -  being so fidgety or restless that you have been moving around a lot more than usual: 0 Thoughts that you would be better off dead, or of hurting yourself in some way: 0 PHQ-9 Total Score: 4 If you checked off any problems, how difficult have these problems made it for you to do your work, take care of things at home, or get along with other people?: Somewhat difficult     Goals Addressed               This Visit's Progress     Increase physical activity (pt-stated)        Remain active and get out of debt!  Objective:    Today's Vitals   09/23/24 1507  Weight: 182 lb (82.6 kg)  Height: 5' (1.524 m)   Body mass index is 35.54 kg/m.  Hearing/Vision screen Hearing Screening - Comments:: Denies hearing difficulties   Vision Screening - Comments:: Wears rx glasses - up to date with routine eye exams with  Dr Raelyn Immunizations and Health Maintenance Health Maintenance  Topic Date Due   Zoster Vaccines- Shingrix (2 of 2) 03/12/2023   COVID-19 Vaccine (6 - 2025-26 season) 04/25/2024   Medicare Annual Wellness (AWV)  09/23/2025   DTaP/Tdap/Td (3 - Td or Tdap) 11/14/2033   Pneumococcal Vaccine: 50+ Years  Completed   Influenza Vaccine  Completed   Bone Density Scan  Completed   Meningococcal B Vaccine  Aged Out        Assessment/Plan:  This is a routine wellness examination for [place].  Patient Care Team: Theophilus Andrews, Tully GRADE, MD as PCP - General (Internal Medicine) Mona Vinie BROCKS, MD as PCP - Cardiology (Cardiology)  I have personally reviewed and noted the following in the patients chart:   Medical and social history Use of alcohol, tobacco or illicit drugs  Current medications and supplements including opioid prescriptions. Functional ability and status Nutritional status Physical activity Advanced directives List of other physicians Hospitalizations, surgeries, and ER visits in previous 12 months Vitals Screenings to include  cognitive, depression, and falls Referrals and appointments  No orders of the defined types were placed in this encounter.  In addition, I have reviewed and discussed with patient certain preventive protocols, quality metrics, and best practice recommendations. A written personalized care plan for preventive services as well as general preventive health recommendations were provided to patient.   Rojelio LELON Blush, LPN   [id number]   Return in 53 weeks (on 09/29/2025).  After Visit Summary: (MyChart) Due to this being a telephonic visit, the after visit summary with patients personalized plan was offered to patient via MyChart   Nurse Notes: No voiced or noted concerns at this time "

## 2024-09-27 ENCOUNTER — Ambulatory Visit: Payer: Self-pay | Admitting: Internal Medicine

## 2024-11-11 ENCOUNTER — Ambulatory Visit: Admitting: Physician Assistant
